# Patient Record
Sex: Female | Born: 1970
Health system: Southern US, Community
[De-identification: ages and names within clinical notes are randomized; demographics above are authoritative.]

## PROBLEM LIST (undated history)

## (undated) DIAGNOSIS — J309 Allergic rhinitis, unspecified: Secondary | ICD-10-CM

## (undated) DIAGNOSIS — I10 Essential (primary) hypertension: Secondary | ICD-10-CM

## (undated) DIAGNOSIS — T7840XA Allergy, unspecified, initial encounter: Secondary | ICD-10-CM

## (undated) DIAGNOSIS — D219 Benign neoplasm of connective and other soft tissue, unspecified: Secondary | ICD-10-CM

## (undated) HISTORY — DX: Allergy, unspecified, initial encounter: T78.40XA

## (undated) HISTORY — DX: Benign neoplasm of connective and other soft tissue, unspecified: D21.9

## (undated) HISTORY — DX: Essential (primary) hypertension: I10

## (undated) HISTORY — PX: EYE SURGERY: SHX253

## (undated) HISTORY — PX: ABDOMINAL HYSTERECTOMY: SHX81

## (undated) HISTORY — DX: Allergic rhinitis, unspecified: J30.9

---
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
## 2003-01-30 HISTORY — PX: COLONOSCOPY: SHX174

## 2018-08-06 ENCOUNTER — Telehealth: Payer: Self-pay | Admitting: *Deleted

## 2018-08-06 NOTE — Telephone Encounter (Signed)
Pt is scheduled for a virtual visit on 08/07/2018

## 2018-08-06 NOTE — Telephone Encounter (Signed)
Copied from [place] [id number]. Topic: Appointment Scheduling - Scheduling Inquiry for Clinic >> Aug 06, 2018 11:24 AM Oneta Rack wrote: Reason for CRM: patient was referred by a [facility] employee to establish with Dr. Grier Mitts, patient relocated from [facility] in October. Patient okay with scheduling a virtual visit, patient experiencing sinus infection. Please follow up with patient today regarding appointment

## 2018-08-07 ENCOUNTER — Ambulatory Visit (INDEPENDENT_AMBULATORY_CARE_PROVIDER_SITE_OTHER): Payer: BC Managed Care – PPO | Admitting: Family Medicine

## 2018-08-07 ENCOUNTER — Encounter: Payer: Self-pay | Admitting: Family Medicine

## 2018-08-07 ENCOUNTER — Other Ambulatory Visit: Payer: Self-pay

## 2018-08-07 DIAGNOSIS — J0100 Acute maxillary sinusitis, unspecified: Secondary | ICD-10-CM | POA: Diagnosis not present

## 2018-08-07 DIAGNOSIS — IMO0001 Reserved for inherently not codable concepts without codable children: Secondary | ICD-10-CM

## 2018-08-07 DIAGNOSIS — Z531 Procedure and treatment not carried out because of patient's decision for reasons of belief and group pressure: Secondary | ICD-10-CM | POA: Diagnosis not present

## 2018-08-07 DIAGNOSIS — Z7689 Persons encountering health services in other specified circumstances: Secondary | ICD-10-CM

## 2018-08-07 DIAGNOSIS — J302 Other seasonal allergic rhinitis: Secondary | ICD-10-CM

## 2018-08-07 MED ORDER — AZITHROMYCIN 250 MG PO TABS: ORAL_TABLET | ORAL | 0 refills | Status: DC

## 2018-08-07 NOTE — Progress Notes (Signed)
Virtual Visit via Video Note  I connected with Deanna Cook on 08/07/18 at 10:00 AM EDT by a video enabled telemedicine application and verified that I am speaking with the correct person using two identifiers.  Location patient: home Location provider:work or home office Persons participating in the virtual visit: patient, provider  I discussed the limitations of evaluation and management by telemedicine and the availability of in person appointments. The patient expressed understanding and agreed to proceed.   HPI: Pt is a 48 yo female with pmh sig for h/o palpitations. Pt was seen at [facility] and by Ruben Im in [place], [place].  Acute concern: -states has been dealing with x 1 month -increased facial pressure, pain/pressure in molars, irritated throat, nasal drainage, HAs.  Coughing more when goes outside. -starting to feel a burning in chest -denies recent abx use, SOB, fever, chills, n/v, sick contacts. -allegra-D and flonase  H/o palpitations: -was drinking several cups of coffee and soda daily -had a negative cardiac w/u -no recent issues.  Past surgical hx: -Hysterectomy 2/2 excessive bleeding from submucosal fibroid in 2014.  At the time hgb was 4.4, pt received iron only which brought her hgb up to 7 so surgery could be done as she is a Jehovah's Witness. -Appendectomy  Social Hx: Pt is married.  Pt does not have any children.  Pt has been in [place] x 1 yr in Oct.  Pt is originally from [place], [place], but lived in [place], [place], [place], [place], [place], and [place].  Pt moved to the area to be closer to her mother who lives in [place], [place].  [place].  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  Pt denies tobacco or drug use.   Pt endorses social alcohol use.  Pt is a Jehovah's Witness.   Allergies: NKDA  Family Medical Hx: Dad-DM, heart dz, MI, "enlarged heart" Mom-HTN, colon cancer  ROS: See pertinent positives and negatives per  HPI.  No past medical history on file.  No family history on file.  No current outpatient medications on file.  EXAM:  VITALS per patient if applicable: RR between 54-09 bpm  GENERAL: alert, oriented, appears well and in no acute distress  HEENT: atraumatic, conjunctiva clear, no obvious abnormalities on inspection of external nose and ears  NECK: normal movements of the head and neck  LUNGS: on inspection no signs of respiratory distress, breathing rate appears normal, no obvious gross SOB, gasping or wheezing  CV: no obvious cyanosis  MS: moves all visible extremities without noticeable abnormality  PSYCH/NEURO: pleasant and cooperative, no obvious depression or anxiety, speech and thought processing grossly intact  ASSESSMENT AND PLAN:  Discussed the following assessment and plan:  Subacute maxillary sinusitis  -continue supportive care: flonase, consider saline nasal spray -discussed management of seasonal allergies - Plan: azithromycin (ZITHROMAX) 250 MG tablet  Seasonal allergies -continue flonase -consider local honey, saline nasal spray -discussed switching to a different OTC allergy medication  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 -We provided refills for any medications we will prescribe as needed. -We addressed current concerns per orders and patient instructions. -We have asked for records for pertinent exams, studies, vaccines and notes from previous providers. -We have advised patient to follow up per instructions below.  F/u prn   I discussed the assessment and treatment plan with  the patient. The patient was provided an opportunity to ask questions and all were answered. The patient agreed with the plan and demonstrated an understanding of the instructions.   The patient was advised to call back or seek an in-person evaluation if the symptoms  worsen or if the condition fails to improve as anticipated.   Billie Ruddy, MD

## 2018-10-08 DIAGNOSIS — J300 Vasomotor rhinitis: Secondary | ICD-10-CM | POA: Diagnosis not present

## 2018-10-08 DIAGNOSIS — J329 Chronic sinusitis, unspecified: Secondary | ICD-10-CM | POA: Diagnosis not present

## 2018-10-08 DIAGNOSIS — J342 Deviated nasal septum: Secondary | ICD-10-CM | POA: Diagnosis not present

## 2018-10-09 ENCOUNTER — Other Ambulatory Visit: Payer: Self-pay

## 2018-10-09 ENCOUNTER — Encounter: Payer: Self-pay | Admitting: Allergy

## 2018-10-09 ENCOUNTER — Ambulatory Visit (INDEPENDENT_AMBULATORY_CARE_PROVIDER_SITE_OTHER): Payer: BC Managed Care – PPO | Admitting: Allergy

## 2018-10-09 VITALS — BP 140/90 | HR 73 | Temp 97.8°F | Resp 18 | Ht 69.0 in | Wt 218.0 lb

## 2018-10-09 DIAGNOSIS — J3089 Other allergic rhinitis: Secondary | ICD-10-CM

## 2018-10-09 DIAGNOSIS — H1013 Acute atopic conjunctivitis, bilateral: Secondary | ICD-10-CM

## 2018-10-09 MED ORDER — LEVOCETIRIZINE DIHYDROCHLORIDE 5 MG PO TABS: 5.0000 mg | ORAL_TABLET | Freq: Every evening | ORAL | 5 refills | Status: DC

## 2018-10-09 MED ORDER — OLOPATADINE HCL 0.1 % OP SOLN: 1.0000 [drp] | Freq: Every day | OPHTHALMIC | 5 refills | Status: DC | PRN

## 2018-10-09 MED ORDER — AZELASTINE-FLUTICASONE 137-50 MCG/ACT NA SUSP: 1.0000 | Freq: Two times a day (BID) | NASAL | 5 refills | Status: DC

## 2018-10-09 NOTE — Patient Instructions (Addendum)
Allergies  - environmental allergy testing today is positive to dust mite and mold (alternaria)  - allergen avoidance measures discussed/handouts provided  - recommend trying long-acting antihistamine, Xyzal 5mg  daily.   This is an antihistamine that you have not tried before and may be more effective than Allegra  - for itchy/watery/red eyes recommend use of over-the-counter Pataday 1 drop each eye daily as needed  -  for nasal congestion/drainage recommend use of generic Dymista (flonase + Astelin) 1 spray each nostril twice a day as needed  - allergen immunotherapy discussed today including protocol, benefits and risk.  Informational handout provided.  If interested in this therapuetic option you can check with your insurance carrier for coverage.  Let us know if you would like to proceed with this option.  Follow-up 4-6 months or sooner if needed

## 2018-10-09 NOTE — Progress Notes (Signed)
New Patient Note  RE: Deanna Cook MRN: [id number] DOB: 1970/09/04 Date of Office Visit: 10/09/2018  Referring provider: Billie Ruddy, MD Primary care provider: Billie Ruddy, MD  Chief Complaint: allergies  History of present illness: Deanna Cook is a 48 y.o. female presenting today for consultation for allergies.    She moved to [place] from [place] area about a year ago.  She feels her allergies are "horrific" here.    She reports having allergy testing before about 20 years ago and recalls being allergic to dust.   She states since moving here she has been getting more sinus infections with sinus pressure in face, behind eyes, teeth pain and feels like she has the flu.  She states she has been treated for sinus infections and Nov, Jan, July thus far.  She states she normally does well with sinus infection treatment with azithromycin.  She denies ever needing systemic steroids. Her regular allergy symptoms include itchy eyes, nasal congestion in the mornings, postnasal drip with throat clearing.  She also feels like she is developing a HA after she has been outside.   She reports taking Allegra and Allegra D which has been the most effective antihistamine for her thus far.  She states Claritin and Zyrtec didn't help at all.   She did see Dr. Lucia Gaskins, ENT who recommended she use flonase.  She states she is using flonase when she feels congested at night.    No history asthma, eczema or food allergy.    Review of systems: Review of Systems  Constitutional: Negative for chills, fever and malaise/fatigue.  HENT: Positive for congestion. Negative for ear discharge, nosebleeds, sinus pain and sore throat.   Eyes: Negative for pain, discharge and redness.  Respiratory: Negative for cough, shortness of breath and wheezing.   Cardiovascular: Negative for chest pain.  Gastrointestinal: Negative for abdominal pain, constipation, diarrhea, heartburn, nausea and vomiting.   Musculoskeletal: Negative for joint pain.  Skin: Negative for itching and rash.  Neurological: Positive for headaches.    All other systems negative unless noted above in HPI  Past medical history: Past Medical History:  Diagnosis Date  . Allergic rhinitis     Past surgical history: Past Surgical History:  Procedure Laterality Date  . ABDOMINAL HYSTERECTOMY    . APPENDECTOMY  1989    Family history:  Family History  Problem Relation Age of Onset  . Hypertension Mother   . Colon cancer Mother   . Heart attack Father   . Diabetes Mellitus I Father   . Kidney failure Father   . Thyroid cancer Sister   . Kidney disease Brother     Social history: Lives in a home with carpeting in the bedroom with electric heating and central cooling.  No pets in the home.  Neighbor has dog.  No concern for water damage, mildew or roaches in the home.  She is an Press photographer.  No smoking history.    Medication List: Allergies as of 10/09/2018   No Known Allergies     Medication List       Accurate as of October 09, 2018  4:56 PM. If you have any questions, ask your nurse or doctor.        STOP taking these medications   azithromycin 250 MG tablet Commonly known as: ZITHROMAX Stopped by: Haleema Vanderheyden Charmian Muff, MD     TAKE these medications   Azelastine-Fluticasone 137-50 MCG/ACT Susp Place 1 spray into the nose 2 (two)  times daily. Started by: Alfredia Desanctis Charmian Muff, MD   B-12 1000 MCG Tabs Take 1 tablet by mouth daily.   fexofenadine 180 MG tablet Commonly known as: ALLEGRA Take 180 mg by mouth daily.   fluticasone 50 MCG/ACT nasal spray Commonly known as: FLONASE Place into both nostrils daily.   levocetirizine 5 MG tablet Commonly known as: XYZAL Take 1 tablet (5 mg total) by mouth every evening. Started by: Azelyn Batie Charmian Muff, MD   multivitamin tablet Take 1 tablet by mouth daily.   olopatadine 0.1 % ophthalmic solution Commonly  known as: PATANOL Place 1 drop into both eyes daily as needed for allergies. Started by: Heron Pitcock Charmian Muff, MD       Known medication allergies: No Known Allergies   Physical examination: Blood pressure 140/90, pulse 73, temperature 97.8 F (36.6 C), temperature source Temporal, resp. rate 18, height 5\' 9"  (1.753 m), weight 218 lb (98.9 kg), SpO2 97 %.  General: Alert, interactive, in no acute distress. HEENT: PERRLA, TMs pearly gray, turbinates moderately edematous without discharge, post-pharynx non erythematous. Neck: Supple without lymphadenopathy. Lungs: Clear to auscultation without wheezing, rhonchi or rales. {no increased work of breathing. CV: Normal S1, S2 without murmurs. Abdomen: Nondistended, nontender. Skin: Warm and dry, without lesions or rashes. Extremities:  No clubbing, cyanosis or edema. Neuro:   Grossly intact.  Diagnositics/Labs:  Allergy testing: environmental allergy skin prick testing is positive to alternaria.  Intradermal testing is positive to mite mix.  Allergy testing results were read and interpreted by provider, documented by clinical staff.   Assessment and plan: Patient Instructions  Allergies  - environmental allergy testing today is positive to dust mite and mold (alternaria)  - allergen avoidance measures discussed/handouts provided  - recommend trying long-acting antihistamine, Xyzal 5mg  daily.   This is an antihistamine that you have not tried before and may be more effective than Allegra  - for itchy/watery/red eyes recommend use of over-the-counter Pataday 1 drop each eye daily as needed  -  for nasal congestion/drainage recommend use of generic Dymista (flonase + Astelin) 1 spray each nostril twice a day as needed  - allergen immunotherapy discussed today including protocol, benefits and risk.  Informational handout provided.  If interested in this therapuetic option you can check with your insurance carrier for coverage.  Let  us know if you would like to proceed with this option.  Follow-up 4-6 months or sooner if needed    I appreciate the opportunity to take part in Deanna Cook's care. Please do not hesitate to contact me with questions.  Sincerely,   Prudy Feeler, MD Allergy/Immunology [facility]

## 2018-10-28 ENCOUNTER — Ambulatory Visit: Payer: BC Managed Care – PPO | Admitting: Allergy and Immunology

## 2018-11-22 ENCOUNTER — Encounter (INDEPENDENT_AMBULATORY_CARE_PROVIDER_SITE_OTHER): Payer: Self-pay

## 2018-12-12 ENCOUNTER — Ambulatory Visit: Payer: BC Managed Care – PPO | Admitting: Family

## 2018-12-12 ENCOUNTER — Telehealth: Payer: Self-pay | Admitting: Family Medicine

## 2018-12-12 ENCOUNTER — Ambulatory Visit: Payer: Self-pay

## 2018-12-12 NOTE — Telephone Encounter (Signed)
I have talked with patient---she is not currently on any blood pressure medicine---she continues to be asymptomatic---I have advised that readings this high for too low increases risk for stroke and the best and safest option would be for her to seek attention at emergency room---I have given address for [facility], patient will go and have her husband go with her---advised she should follow up with her pcp soon

## 2018-12-12 NOTE — Telephone Encounter (Signed)
Pt. Called to report elevated BP today of 198/110 @ 12:30 PM.  Repeated BP check at 12:50 PM: 181/109.    Stated she has been having intermittent headaches.  Denied difficulty with speech, facial droop, loss of vision/ vision changes.  Reported she has noticed a tingly or numb sensation in left leg or in left arm early in AM about every 7-10 days.  Denied headache or numbness in extremities at this time.  Reported she was made aware, at another doctor's office, back in September, that she should begin monitoring her BP, due to elevated readings.  Reported her readings have fluctuated from 150-170's/95-108, and then today was higher.  Attempted to contact [facility] at 12:53.  Advised pt. To expect a call back re: an appt. Today.  Care advice given per protocol.  Pt. Verb. Understanding.      Reason for Disposition . Systolic BP  >= 99991111 OR Diastolic >= A999333  Answer Assessment - Initial Assessment Questions 1. BLOOD PRESSURE: "What is the blood pressure?" "Did you take at least two measurements 5 minutes apart?"     198/110 at 12:30 PM;  181/109 @ 12:50 PM 2. ONSET: "When did you take your blood pressure?"     Has been monitoring her BP since September; .   3. HOW: "How did you obtain the blood pressure?" (e.g., visiting nurse, automatic home BP monitor)     Digital cuff 4. HISTORY: "Do you have a history of high blood pressure?"     Has had higher readings  5. MEDICATIONS: "Are you taking any medications for blood pressure?" "Have you missed any doses recently?"     Not on blood pressure medication 6. OTHER SYMPTOMS: "Do you have any symptoms?" (e.g., headache, chest pain, blurred vision, difficulty breathing, weakness)    intermittent headaches, Denied speech problems, facial droop, disorientation, loss of vision or blurred vision.  Reported intermittent "tingly or numbness" in left leg when she wakes up in the AM, occurring about every 7-10 days.    7. PREGNANCY: "Is there any chance you  are pregnant?" "When was your last menstrual period?"     Had Hysterectomy about 6 yrs ago.  Protocols used: HIGH BLOOD PRESSURE-A-AH

## 2018-12-12 NOTE — Telephone Encounter (Signed)
Contacted FC at [facility].  Spoke with Tammy; was advised there are no appts. Available at their office today.   Ret'd call to pt. Advised that there are no available appts. at [facility].  Pt. stated she would be willing to be evaluated at another [place] office.    Call placed to [facility] ; scheduled pt. for 3:20 PM appt. With Jodi Mourning, NP; pt. Agreed with plan.

## 2018-12-13 ENCOUNTER — Emergency Department (HOSPITAL_COMMUNITY)
Admission: EM | Admit: 2018-12-13 | Discharge: 2018-12-13 | Disposition: A | Discharge status: Home / Self Care | Payer: BC Managed Care – PPO | Attending: Emergency Medicine | Admitting: Emergency Medicine

## 2018-12-13 ENCOUNTER — Emergency Department (HOSPITAL_COMMUNITY): Payer: BC Managed Care – PPO

## 2018-12-13 ENCOUNTER — Encounter (HOSPITAL_COMMUNITY): Payer: Self-pay | Admitting: Emergency Medicine

## 2018-12-13 ENCOUNTER — Other Ambulatory Visit: Payer: Self-pay

## 2018-12-13 DIAGNOSIS — R519 Headache, unspecified: Secondary | ICD-10-CM | POA: Diagnosis not present

## 2018-12-13 DIAGNOSIS — Z79899 Other long term (current) drug therapy: Secondary | ICD-10-CM | POA: Insufficient documentation

## 2018-12-13 DIAGNOSIS — I160 Hypertensive urgency: Secondary | ICD-10-CM

## 2018-12-13 LAB — COMPREHENSIVE METABOLIC PANEL
ALT: 23 U/L (ref 0–44)
AST: 22 U/L (ref 15–41)
Albumin: 4.1 g/dL (ref 3.5–5.0)
Alkaline Phosphatase: 69 U/L (ref 38–126)
Anion gap: 8 (ref 5–15)
BUN: 8 mg/dL (ref 6–20)
CO2: 25 mmol/L (ref 22–32)
Calcium: 9.3 mg/dL (ref 8.9–10.3)
Chloride: 103 mmol/L (ref 98–111)
Creatinine, Ser: 0.91 mg/dL (ref 0.44–1.00)
GFR calc Af Amer: >60 mL/min (ref >60)
GFR calc non Af Amer: >60 mL/min (ref >60)
Glucose, Bld: 124 mg/dL — ABNORMAL HIGH (ref 70–99)
Potassium: 3.5 mmol/L (ref 3.5–5.1)
Sodium: 136 mmol/L (ref 135–145)
Total Bilirubin: 0.6 mg/dL (ref 0.3–1.2)
Total Protein: 7.9 g/dL (ref 6.5–8.1)

## 2018-12-13 LAB — CBC
HCT: 41 % (ref 36.0–46.0)
Hemoglobin: 13.7 g/dL (ref 12.0–15.0)
MCH: 30.4 pg (ref 26.0–34.0)
MCHC: 33.4 g/dL (ref 30.0–36.0)
MCV: 90.9 fL (ref 80.0–100.0)
Platelets: 263 10*3/uL (ref 150–400)
RBC: 4.51 MIL/uL (ref 3.87–5.11)
RDW: 12.5 % (ref 11.5–15.5)
WBC: 8.3 10*3/uL (ref 4.0–10.5)
nRBC: 0 % (ref 0.0–0.2)

## 2018-12-13 MED ORDER — HYDROCHLOROTHIAZIDE 25 MG PO TABS: 25.0000 mg | ORAL_TABLET | Freq: Every day | ORAL | 0 refills | Status: DC

## 2018-12-13 NOTE — ED Triage Notes (Signed)
Pt c/o intermittent headache since being told in September that she has hypertension, pt doesn't currently take meds for htn, states headache has worsened, denies blurry vision, denies edema, but was told to come to ed for htn.

## 2018-12-13 NOTE — Discharge Instructions (Signed)
Take your blood pressure medicine daily.  This may make you urinate more, so I recommend you take it in the morning to prevent the need to urinate at night. If you are getting dizzy or lightheaded, you may either take half a pill a day or stop the medicine. Follow-up with your primary care doctor at your scheduled appointment for further management and evaluation of your blood pressure. Continue taking your blood pressure daily, at the same time each day, for trending and monitoring of your blood pressure. Return to the emergency room if you develop vision changes, chest pain, speech difficulty, numbness/tingling/weakness, or if any new, worsening, or concerning symptoms.

## 2018-12-13 NOTE — ED Provider Notes (Signed)
[facility] DEPT Provider Note   CSN: [id number] Arrival date & time: 12/13/18  1040     History   Chief Complaint Chief Complaint  Patient presents with   Headache   Hypertension    HPI Deanna Cook is a 48 y.o. female presenting for evaluation of hypertension.  Patient states for the past several years, she has had intermittent doctors visits has been found that her blood pressures been high.  2 months ago, it was in the 190s during her allergist appointment.  She was told to follow-up with her primary care doctor.  She has been recording her blood pressures at home, but has yet to follow-up with her PCP.  Patient states at home her blood pressure readings are normally [id number] to [id number] systolic over [id number] to [id number] diastolic.  Patient states she has been having intermittent headaches, but did have a severe headache several days ago.  She reports a headache currently, which could be due to her sinuses which have been acting up, or patient is also concerned this is due to her blood pressure.  She took ibuprofen this morning which improved her headache, but not completely resolved.  She denies vision changes, slurred speech, tingling, numbness, chest pain, shortness of breath, flank pain, abnormal urination.  Patient states 1 time in the past several months she had a single episode of her left leg feeling tingly, which resolved without intervention.  No recurrence of this.  She has no other medical problems, takes no medications daily.  She takes NSAIDs about once a week.  Patient states of the past 2 months, she has decreased her salt intake.  She states she walks frequently.     HPI  Past Medical History:  Diagnosis Date   Allergic rhinitis     Patient Active Problem List   Diagnosis Date Noted   Refusal of blood transfusions as patient is [employer]'s Witness 08/07/2018   Seasonal allergies 08/07/2018    Past Surgical History:  Procedure Laterality  Date   ABDOMINAL HYSTERECTOMY     APPENDECTOMY  1989     OB History   No obstetric history on file.      Home Medications    Prior to Admission medications   Medication Sig Start Date End Date Taking? Authorizing Provider  Azelastine-Fluticasone 137-50 MCG/ACT SUSP Place 1 spray into the nose 2 (two) times daily. 10/09/18   Kennith Gain, MD  Cyanocobalamin (B-12) 1000 MCG TABS Take 1 tablet by mouth daily.    [provider]  fexofenadine (ALLEGRA) 180 MG tablet Take 180 mg by mouth daily.    [provider]  fluticasone (FLONASE) 50 MCG/ACT nasal spray Place into both nostrils daily.    [provider]  hydrochlorothiazide (HYDRODIURIL) 25 MG tablet Take 1 tablet (25 mg total) by mouth daily. 12/13/18   Mykaela Arena, PA-C  levocetirizine (XYZAL) 5 MG tablet Take 1 tablet (5 mg total) by mouth every evening. 10/09/18   Kennith Gain, MD  Multiple Vitamin (MULTIVITAMIN) tablet Take 1 tablet by mouth daily.    [provider]  olopatadine (PATANOL) 0.1 % ophthalmic solution Place 1 drop into both eyes daily as needed for allergies. 10/09/18   Kennith Gain, MD    Family History Family History  Problem Relation Age of Onset   Hypertension Mother    Colon cancer Mother    Heart attack Father    Diabetes Mellitus I Father    Kidney failure Father  Thyroid cancer Sister    Kidney disease Brother     Social History Social History   Tobacco Use   Smoking status: Never Smoker   Smokeless tobacco: Never Used  Substance Use Topics   Alcohol use: Yes    Comment: social   Drug use: Never     Allergies   Patient has no known allergies.   Review of Systems Review of Systems  Neurological: Positive for headaches.  All other systems reviewed and are negative.    Physical Exam Updated Vital Signs BP (!) 171/103    Pulse 79    Temp 98.3 F (36.8 C) (Oral)    Resp 16    SpO2 100%    Physical Exam Vitals signs and nursing note reviewed.  Constitutional:      General: She is not in acute distress.    Appearance: She is well-developed.     Comments: Resting comfortably in the bed in no acute distress  HENT:     Head: Normocephalic and atraumatic.  Eyes:     Extraocular Movements: Extraocular movements intact.     Conjunctiva/sclera: Conjunctivae normal.     Pupils: Pupils are equal, round, and reactive to light.     Comments: EOMI and PERRLA. No nystagmus  Neck:     Musculoskeletal: Normal range of motion and neck supple.  Cardiovascular:     Rate and Rhythm: Normal rate and regular rhythm.     Pulses: Normal pulses.  Pulmonary:     Effort: Pulmonary effort is normal. No respiratory distress.     Breath sounds: Normal breath sounds. No wheezing.     Comments: Clear lung sounds in all fields Abdominal:     General: There is no distension.     Palpations: Abdomen is soft. There is no mass.     Tenderness: There is no abdominal tenderness. There is no guarding or rebound.  Musculoskeletal: Normal range of motion.     Comments: Strength and sensation intact x4.  No leg swelling.  Skin:    General: Skin is warm and dry.     Capillary Refill: Capillary refill takes less than 2 seconds.  Neurological:     General: No focal deficit present.     Mental Status: She is alert and oriented to person, place, and time.     GCS: GCS eye subscore is 4. GCS verbal subscore is 5. GCS motor subscore is 6.     Cranial Nerves: Cranial nerves are intact.     Sensory: Sensation is intact.     Motor: Motor function is intact. No pronator drift.     Coordination: Coordination is intact. Coordination normal. Finger-Nose-Finger Test and Heel to [facility] Test normal.     Comments: No obvious neurologic deficits.  CN intact.  Nose to finger intact.  Fine movement and coordination intact.      ED Treatments / Results  Labs (all labs ordered are listed, but only abnormal results are  displayed) Labs Reviewed  COMPREHENSIVE METABOLIC PANEL - Abnormal; Notable for the following components:      Result Value   Glucose, Bld 124 (*)    All other components within normal limits  CBC    EKG None  Radiology Ct Head Wo Contrast  Result Date: 12/13/2018 CLINICAL DATA:  Intermittent headache, worsened. Hypertension. EXAM: CT HEAD WITHOUT CONTRAST TECHNIQUE: Contiguous axial images were obtained from the base of the skull through the vertex without intravenous contrast. COMPARISON:  None. FINDINGS: Brain: Normal appearing  cerebral hemispheres and posterior fossa structures. Normal size and position of the ventricles. No intracranial hemorrhage, mass lesion or CT evidence of acute infarction. Vascular: No hyperdense vessel or unexpected calcification. Skull: Normal. Negative for fracture or focal lesion. Sinuses/Orbits: Unremarkable. Other: None. IMPRESSION: Normal examination. Electronically Signed   By: Claudie Revering M.D.   On: 12/13/2018 12:02    Procedures Procedures (including critical care time)  Medications Ordered in ED Medications - No data to display   Initial Impression / Assessment and Plan / ED Course  I have reviewed the triage vital signs and the nursing notes.  Pertinent labs & imaging results that were available during my care of the patient were reviewed by me and considered in my medical decision making (see chart for details).        Patient presenting for evaluation of high blood pressure and headache.  Physical exam reassuring, she appears nontoxic.  No obvious neurologic deficits.  Doubt stroke or endorgan damage.  However, as patient has persistent headache, which she states feels slightly different from her normal sinus headaches, will obtain CT to rule out process.  Will obtain basic labs to assess for endorgan damage and to assess kidney function.  Patient will likely need to be started on blood pressure medicine, however can be followed-up  outpatient for asymptomatic hypertension.  Labs reassuring.  Creatinine normal.  CT head without concerning edema or obvious intracranial emergency.  Discussed findings with patient.  Discussed plan for starting HCTZ, continued monitoring of blood pressure, close follow-up with PCP.  Discussed warning signs of hypotension.  Upon discharge, patient states that intermittently she is having chest pain, usually with exertion/exercise.  Occasionally at rest.  I discussed further work-up including EKG and troponin, patient declined.  Patient states she just wanted me to know, but does not want any further evaluation or work-up at this time.  I discussed that I cannot rule out a cardiac abnormality or concerning event with her symptoms and hypertension, patient states she understands but will follow up with her primary care doctor.  Discussed strict return precautions including worsening chest pain, signs of stroke, or other signs of endorgan damage.  Discussed DASH diet and importance of exercise.  At this time, patient appears safe for discharge.  Return precautions given.  Patient states she understands and agrees to plan.   Final Clinical Impressions(s) / ED Diagnoses   Final diagnoses:  Asymptomatic hypertensive urgency    ED Discharge Orders         Ordered    hydrochlorothiazide (HYDRODIURIL) 25 MG tablet  Daily     12/13/18 1230           Franchot Heidelberg, PA-C 12/13/18 1308    Nat Christen, MD 12/13/18 1324

## 2018-12-19 ENCOUNTER — Other Ambulatory Visit: Payer: Self-pay

## 2018-12-19 DIAGNOSIS — Z20822 Contact with and (suspected) exposure to covid-19: Secondary | ICD-10-CM

## 2018-12-22 LAB — NOVEL CORONAVIRUS, NAA: SARS-CoV-2, NAA: NOT DETECTED

## 2018-12-24 ENCOUNTER — Ambulatory Visit (INDEPENDENT_AMBULATORY_CARE_PROVIDER_SITE_OTHER): Payer: BC Managed Care – PPO | Admitting: Family Medicine

## 2018-12-24 ENCOUNTER — Other Ambulatory Visit: Payer: Self-pay

## 2018-12-24 ENCOUNTER — Encounter: Payer: Self-pay | Admitting: Family Medicine

## 2018-12-24 VITALS — BP 132/80 | HR 78 | Temp 97.3°F | Wt 216.0 lb

## 2018-12-24 DIAGNOSIS — I10 Essential (primary) hypertension: Secondary | ICD-10-CM | POA: Diagnosis not present

## 2018-12-24 DIAGNOSIS — E049 Nontoxic goiter, unspecified: Secondary | ICD-10-CM | POA: Diagnosis not present

## 2018-12-24 DIAGNOSIS — Z23 Encounter for immunization: Secondary | ICD-10-CM

## 2018-12-24 DIAGNOSIS — Z0000 Encounter for general adult medical examination without abnormal findings: Secondary | ICD-10-CM

## 2018-12-24 DIAGNOSIS — Z13220 Encounter for screening for lipoid disorders: Secondary | ICD-10-CM

## 2018-12-24 DIAGNOSIS — E6609 Other obesity due to excess calories: Secondary | ICD-10-CM

## 2018-12-24 DIAGNOSIS — N6011 Diffuse cystic mastopathy of right breast: Secondary | ICD-10-CM

## 2018-12-24 LAB — CBC WITH DIFFERENTIAL/PLATELET
Basophils Absolute: 0.1 10*3/uL (ref 0.0–0.1)
Basophils Relative: 0.8 % (ref 0.0–3.0)
Eosinophils Absolute: 0.1 10*3/uL (ref 0.0–0.7)
Eosinophils Relative: 1.4 % (ref 0.0–5.0)
HCT: 39.6 % (ref 36.0–46.0)
Hemoglobin: 13.2 g/dL (ref 12.0–15.0)
Lymphocytes Relative: 30.1 % (ref 12.0–46.0)
Lymphs Abs: 2.4 10*3/uL (ref 0.7–4.0)
MCHC: 33.4 g/dL (ref 30.0–36.0)
MCV: 89.9 fl (ref 78.0–100.0)
Monocytes Absolute: 0.3 10*3/uL (ref 0.1–1.0)
Monocytes Relative: 3.7 % (ref 3.0–12.0)
Neutro Abs: 5.1 10*3/uL (ref 1.4–7.7)
Neutrophils Relative %: 64 % (ref 43.0–77.0)
Platelets: 287 10*3/uL (ref 150.0–400.0)
RBC: 4.41 Mil/uL (ref 3.87–5.11)
RDW: 12.9 % (ref 11.5–15.5)
WBC: 8 10*3/uL (ref 4.0–10.5)

## 2018-12-24 LAB — BASIC METABOLIC PANEL
BUN: 7 mg/dL (ref 6–23)
CO2: 28 mEq/L (ref 19–32)
Calcium: 8.8 mg/dL (ref 8.4–10.5)
Chloride: 101 mEq/L (ref 96–112)
Creatinine, Ser: 0.83 mg/dL (ref 0.40–1.20)
GFR: 89.07 mL/min (ref >60.00)
Glucose, Bld: 118 mg/dL — ABNORMAL HIGH (ref 70–99)
Potassium: 3.6 mEq/L (ref 3.5–5.1)
Sodium: 138 mEq/L (ref 135–145)

## 2018-12-24 LAB — LIPID PANEL
Cholesterol: 161 mg/dL (ref 0–200)
HDL: 57.8 mg/dL (ref >39.00)
LDL Cholesterol: 88 mg/dL (ref 0–99)
NonHDL: 102.8
Total CHOL/HDL Ratio: 3
Triglycerides: 75 mg/dL (ref 0.0–149.0)
VLDL: 15 mg/dL (ref 0.0–40.0)

## 2018-12-24 LAB — HEMOGLOBIN A1C: Hgb A1c MFr Bld: 5.7 % (ref 4.6–6.5)

## 2018-12-24 LAB — T4, FREE: Free T4: 0.83 ng/dL (ref 0.60–1.60)

## 2018-12-24 LAB — TSH: TSH: 1.35 u[IU]/mL (ref 0.35–4.50)

## 2018-12-24 NOTE — Patient Instructions (Addendum)
Preventive Care 47-47 Years Old, Female °Preventive care refers to visits with your health care provider and lifestyle choices that can promote health and wellness. This includes: °· A yearly physical exam. This may also be called an annual well check. °· Regular dental visits and eye exams. °· Immunizations. °· Screening for certain conditions. °· Healthy lifestyle choices, such as eating a healthy diet, getting regular exercise, not using drugs or products that contain nicotine and tobacco, and limiting alcohol use. °What can I expect for my preventive care visit? °Physical exam °Your health care provider will check your: °· Height and weight. This may be used to calculate body mass index (BMI), which tells if you are at a healthy weight. °· Heart rate and blood pressure. °· Skin for abnormal spots. °Counseling °Your health care provider may ask you questions about your: °· Alcohol, tobacco, and drug use. °· Emotional well-being. °· Home and relationship well-being. °· Sexual activity. °· Eating habits. °· Work and work environment. °· Method of birth control. °· Menstrual cycle. °· Pregnancy history. °What immunizations do I need? ° °Influenza (flu) vaccine °· This is recommended every year. °Tetanus, diphtheria, and pertussis (Tdap) vaccine °· You may need a Td booster every 10 years. °Varicella (chickenpox) vaccine °· You may need this if you have not been vaccinated. °Zoster (shingles) vaccine °· You may need this after age 60. °Measles, mumps, and rubella (MMR) vaccine °· You may need at least one dose of MMR if you were born in 1957 or later. You may also need a second dose. °Pneumococcal conjugate (PCV13) vaccine °· You may need this if you have certain conditions and were not previously vaccinated. °Pneumococcal polysaccharide (PPSV23) vaccine °· You may need one or two doses if you smoke cigarettes or if you have certain conditions. °Meningococcal conjugate (MenACWY) vaccine °· You may need this if you  have certain conditions. °Hepatitis A vaccine °· You may need this if you have certain conditions or if you travel or work in places where you may be exposed to hepatitis A. °Hepatitis B vaccine °· You may need this if you have certain conditions or if you travel or work in places where you may be exposed to hepatitis B. °Haemophilus influenzae type b (Hib) vaccine °· You may need this if you have certain conditions. °Human papillomavirus (HPV) vaccine °· If recommended by your health care provider, you may need three doses over 6 months. °You may receive vaccines as individual doses or as more than one vaccine together in one shot (combination vaccines). Talk with your health care provider about the risks and benefits of combination vaccines. °What tests do I need? °Blood tests °· Lipid and cholesterol levels. These may be checked every 5 years, or more frequently if you are over 47 years old. °· Hepatitis C test. °· Hepatitis B test. °Screening °· Lung cancer screening. You may have this screening every year starting at age 47 if you have a 30-pack-year history of smoking and currently smoke or have quit within the past 15 years. °· Colorectal cancer screening. All adults should have this screening starting at age 47 and continuing until age 75. Your health care provider may recommend screening at age 47 if you are at increased risk. You will have tests every 1-10 years, depending on your results and the type of screening test. °· Diabetes screening. This is done by checking your blood sugar (glucose) after you have not eaten for a while (fasting). You may have this   done every 1-3 years.  Mammogram. This may be done every 1-2 years. Talk with your health care provider about when you should start having regular mammograms. This may depend on whether you have a family history of breast cancer.  BRCA-related cancer screening. This may be done if you have a family history of breast, ovarian, tubal, or peritoneal  cancers.  Pelvic exam and Pap test. This may be done every 3 years starting at age 47. Starting at age 47, this may be done every 5 years if you have a Pap test in combination with an HPV test. Other tests  Sexually transmitted disease (STD) testing.  Bone density scan. This is done to screen for osteoporosis. You may have this scan if you are at high risk for osteoporosis. Follow these instructions at home: Eating and drinking  Eat a diet that includes fresh fruits and vegetables, whole grains, lean protein, and low-fat dairy.  Take vitamin and mineral supplements as recommended by your health care provider.  Do not drink alcohol if: ? Your health care provider tells you not to drink. ? You are pregnant, may be pregnant, or are planning to become pregnant.  If you drink alcohol: ? Limit how much you have to 0-1 drink a day. ? Be aware of how much alcohol is in your drink. In the [place] one drink equals one 12 oz bottle of beer (355 mL), one 5 oz glass of wine (148 mL), or one 1 oz glass of hard liquor (44 mL). Lifestyle  Take daily care of your teeth and gums.  Stay active. Exercise for at least 30 minutes on 5 or more days each week.  Do not use any products that contain nicotine or tobacco, such as cigarettes, e-cigarettes, and chewing tobacco. If you need help quitting, ask your health care provider.  If you are sexually active, practice safe sex. Use a condom or other form of birth control (contraception) in order to prevent pregnancy and STIs (sexually transmitted infections).  If told by your health care provider, take low-dose aspirin daily starting at age 47. What's next?  Visit your health care provider once a year for a well check visit.  Ask your health care provider how often you should have your eyes and teeth checked.  Stay up to date on all vaccines. This information is not intended to replace advice given to you by your health care provider. Make sure you  discuss any questions you have with your health care provider. Document Released: 02/11/2015 Document Revised: 09/26/2017 Document Reviewed: 09/26/2017 Elsevier Patient Education  2020 [place] Your Hypertension Hypertension is commonly called high blood pressure. This is when the force of your blood pressing against the walls of your arteries is too strong. Arteries are blood vessels that carry blood from your heart throughout your body. Hypertension forces the heart to work harder to pump blood, and may cause the arteries to become narrow or stiff. Having untreated or uncontrolled hypertension can cause heart attack, stroke, kidney disease, and other problems. What are blood pressure readings? A blood pressure reading consists of a higher number over a lower number. Ideally, your blood pressure should be below 120/80. The first ("top") number is called the systolic pressure. It is a measure of the pressure in your arteries as your heart beats. The second ("bottom") number is called the diastolic pressure. It is a measure of the pressure in your arteries as the heart relaxes. What does my blood pressure reading  mean? Blood pressure is classified into four stages. Based on your blood pressure reading, your health care provider may use the following stages to determine what type of treatment you need, if any. Systolic pressure and diastolic pressure are measured in a unit called mm Hg. Normal  Systolic pressure: below 101.  Diastolic pressure: below 80. Elevated  Systolic pressure: 751-025.  Diastolic pressure: below 80. Hypertension stage 1  Systolic pressure: 852-778.  Diastolic pressure: 24-23. Hypertension stage 2  Systolic pressure: 536 or above.  Diastolic pressure: 90 or above. What health risks are associated with hypertension? Managing your hypertension is an important responsibility. Uncontrolled hypertension can lead to:  A heart attack.  A stroke.  A  weakened blood vessel (aneurysm).  Heart failure.  Kidney damage.  Eye damage.  Metabolic syndrome.  Memory and concentration problems. What changes can I make to manage my hypertension? Hypertension can be managed by making lifestyle changes and possibly by taking medicines. Your health care provider will help you make a plan to bring your blood pressure within a normal range. Eating and drinking   Eat a diet that is high in fiber and potassium, and low in salt (sodium), added sugar, and fat. An example eating plan is called the DASH (Dietary Approaches to Stop Hypertension) diet. To eat this way: ? Eat plenty of fresh fruits and vegetables. Try to fill half of your plate at each meal with fruits and vegetables. ? Eat whole grains, such as whole wheat pasta, brown rice, or whole grain bread. Fill about one quarter of your plate with whole grains. ? Eat low-fat diary products. ? Avoid fatty cuts of meat, processed or cured meats, and poultry with skin. Fill about one quarter of your plate with lean proteins such as fish, chicken without skin, beans, eggs, and tofu. ? Avoid premade and processed foods. These tend to be higher in sodium, added sugar, and fat.  Reduce your daily sodium intake. Most people with hypertension should eat less than 1,500 mg of sodium a day.  Limit alcohol intake to no more than 1 drink a day for nonpregnant women and 2 drinks a day for men. One drink equals 12 oz of beer, 5 oz of wine, or 1 oz of hard liquor. Lifestyle  Work with your health care provider to maintain a healthy body weight, or to lose weight. Ask what an ideal weight is for you.  Get at least 30 minutes of exercise that causes your heart to beat faster (aerobic exercise) most days of the week. Activities may include walking, swimming, or biking.  Include exercise to strengthen your muscles (resistance exercise), such as weight lifting, as part of your weekly exercise routine. Try to do these  types of exercises for 30 minutes at least 3 days a week.  Do not use any products that contain nicotine or tobacco, such as cigarettes and e-cigarettes. If you need help quitting, ask your health care provider.  Control any long-term (chronic) conditions you have, such as high cholesterol or diabetes. Monitoring  Monitor your blood pressure at home as told by your health care provider. Your personal target blood pressure may vary depending on your medical conditions, your age, and other factors.  Have your blood pressure checked regularly, as often as told by your health care provider. Working with your health care provider  Review all the medicines you take with your health care provider because there may be side effects or interactions.  Talk with your health care  provider about your diet, exercise habits, and other lifestyle factors that may be contributing to hypertension.  Visit your health care provider regularly. Your health care provider can help you create and adjust your plan for managing hypertension. Will I need medicine to control my blood pressure? Your health care provider may prescribe medicine if lifestyle changes are not enough to get your blood pressure under control, and if:  Your systolic blood pressure is 130 or higher.  Your diastolic blood pressure is 80 or higher. Take medicines only as told by your health care provider. Follow the directions carefully. Blood pressure medicines must be taken as prescribed. The medicine does not work as well when you skip doses. Skipping doses also puts you at risk for problems. Contact a health care provider if:  You think you are having a reaction to medicines you have taken.  You have repeated (recurrent) headaches.  You feel dizzy.  You have swelling in your ankles.  You have trouble with your vision. Get help right away if:  You develop a severe headache or confusion.  You have unusual weakness or numbness, or you  feel faint.  You have severe pain in your chest or abdomen.  You vomit repeatedly.  You have trouble breathing. Summary  Hypertension is when the force of blood pumping through your arteries is too strong. If this condition is not controlled, it may put you at risk for serious complications.  Your personal target blood pressure may vary depending on your medical conditions, your age, and other factors. For most people, a normal blood pressure is less than 120/80.  Hypertension is managed by lifestyle changes, medicines, or both. Lifestyle changes include weight loss, eating a healthy, low-sodium diet, exercising more, and limiting alcohol. This information is not intended to replace advice given to you by your health care provider. Make sure you discuss any questions you have with your health care provider. Document Released: 10/10/2011 Document Revised: 05/09/2018 Document Reviewed: 12/14/2015 Elsevier Patient Education  [place]  A goiter is an enlarged thyroid gland. The thyroid is located in the lower front of the neck. It makes hormones that affect many body parts and systems, including the system that affects how quickly the body burns fuel for energy (metabolism). Most goiters are painless and are not a cause for concern. Some goiters can affect the way your thyroid makes thyroid hormones. Goiters and conditions that cause goiters can be treated, if necessary. What are the causes? Common causes of this condition include:  Lack (deficiency) of a mineral called iodine. The thyroid gland uses iodine to make thyroid hormones.  Diseases that attack healthy cells in the body (autoimmune diseases) and affect thyroid function, such as Graves' disease or Hashimoto's disease. These diseases may cause the body to produce too much thyroid hormone (hyperthyroidism) or too little of the hormone (hypothyroidism).  Conditions that cause inflammation of the thyroid  (thyroiditis).  One or more small growths on the thyroid (nodular goiter). Other causes include:  Medical problems caused by abnormal genes that are passed from parent to child (genetic defects).  Thyroid injury or infection.  Tumors that may or may not be cancerous.  Pregnancy.  Certain medicines.  Exposure to radiation. In some cases, the cause may not be known. What increases the risk? This condition is more likely to develop in:  People who do not get enough iodine in their diet.  People who have a family history of goiter.  Women.  People who are older than age 49.  People who smoke tobacco.  People who have had exposure to radiation. What are the signs or symptoms? The main symptom of this condition is swelling in the lower, front part of the neck. This swelling can range from a very small bump to a large lump. Other symptoms may include:  A tight feeling in the throat.  A hoarse voice.  Coughing.  Wheezing.  Difficulty swallowing or breathing.  Bulging veins in the neck.  Dizziness. When a goiter is the result of an overactive thyroid (hyperthyroidism), symptoms may also include:  Nervousness or restlessness.  Inability to tolerate heat.  Unexplained weight loss.  Diarrhea.  Change in the texture of hair or skin.  Changes in heartbeat, such as skipped beats, extra beats, or a rapid heart rate.  Loss of menstruation.  Shaky hands.  Increased appetite.  Sleep problems. When a goiter is the result of an underactive thyroid (hypothyroidism), symptoms may also include:  Feeling like you have no energy (lethargy).  Inability to tolerate cold.  Weight gain that is not explained by a change in diet or exercise habits.  Dry skin.  Coarse hair.  Irregular menstrual periods.  Constipation.  Sadness or depression.  Fatigue. In some cases, there may not be any symptoms and the thyroid hormone levels may be normal. How is this  diagnosed? This condition may be diagnosed based on your symptoms, your medical history, and a physical exam. You may have tests, such as:  Blood tests to check thyroid function.  Imaging tests, such as: ? Ultrasound. ? CT scan. ? MRI. ? Thyroid scan.  Removal of a tissue sample (biopsy) of the goiter or any nodules. The sample will be tested to check for cancer. How is this treated? Treatment for this condition depends on the cause and your symptoms. Treatment may include:  Medicines to regulate thyroid hormone levels.  Anti-inflammatory medicines or steroid medicines, if the goiter is caused by inflammation.  Iodine supplements or changes to your diet, if the goiter is caused by iodine deficiency.  Radioactive iodine treatment.  Surgery to remove your thyroid. In some cases, you may only need regular check-ups with your health care provider to monitor your condition, and you may not need treatment. Follow these instructions at home:  Follow instructions from your health care provider about any changes to your diet.  Take over-the-counter and prescription medicines only as told by your health care provider. These include supplements.  Do not use any products that contain nicotine or tobacco, such as cigarettes and e-cigarettes. If you need help quitting, ask your health care provider.  Keep all follow-up visits as told by your health care provider. This is important. Contact a health care provider if:  Your symptoms do not get better with treatment.  You have nausea, vomiting, or diarrhea. Get help right away if:  You have sudden, unexplained confusion or other mental changes.  You have a fever.  You have chest pain.  You have trouble breathing or swallowing.  You suddenly become very weak.  You experience extreme restlessness.  You feel your heart racing. Summary  A goiter is an enlarged thyroid gland.  The thyroid gland is located in the lower front of the  neck. It makes hormones that affect many body parts and systems, including the system that affects how quickly the body burns fuel for energy (metabolism).  The main symptom of this condition is swelling in the lower, front part  of the neck. This swelling can range from a very small bump to a large lump.  Treatment for this condition depends on the cause and your symptoms. You may need medicines, supplements, or regular monitoring of your condition. This information is not intended to replace advice given to you by your health care provider. Make sure you discuss any questions you have with your health care provider. Document Released: 07/05/2009 Document Revised: 12/28/2016 Document Reviewed: 10/11/2016 Elsevier Patient Education  2020 [place].  Fibrocystic Breast Changes  Fibrocystic breast changes are changes in breast tissue that can cause breasts to become swollen, lumpy, or painful. This can happen due to buildup of scar-like tissue (fibrous tissue) or the forming of fluid-filled lumps (cysts) in the breast. This is a common condition, and it is not cancerous (is benign). The exact cause is not known, but it seems to occur when women go through hormonal changes during their menstrual cycle. Fibrocystic breast changes can affect one or both breasts. What are the causes? The exact cause of fibrocystic breast changes is not known. However, this condition:  May be related to the female hormones estrogen and progesterone.  May be influenced by family traits that get passed from parent to child (genetics). What are the signs or symptoms? Symptoms of this condition may affect one or both breasts, and may include:  Tenderness, mild discomfort, or pain.  Swelling.  Rope-like tissue that can be felt when touching the breast.  Lumps in one or both breasts.  Changes in breast size. Breasts may get larger before the menstrual period and smaller after the menstrual period.  Green or dark  brown discharge from the nipple. Symptoms are usually worse before menstrual periods start, and they get better toward the end of menstrual periods. How is this diagnosed? This condition is diagnosed based on your medical history and a physical exam of your breasts. You may also have tests, such as:  A breast X-ray (mammogram).  Ultrasound of your breasts.  MRI.  Removal of a breast tissue sample for testing (breast biopsy). This may be done if your health care provider thinks that something else may be causing changes in your breasts. How is this treated? Often, treatment is not needed for this condition. In some cases, treatment may include:  Taking over-the-counter pain relievers to help lessen pain or discomfort.  Limiting or avoiding caffeine. Foods and beverages that contain caffeine include chocolate, soda, coffee, and tea.  Reducing sugar and fat in your diet. Your health care provider may also recommend:  A procedure to remove fluid from a cyst that is causing pain (fine needle aspiration).  Surgery to remove a cyst that is large or tender or does not go away. Follow these instructions at home:  Examine your breasts after every menstrual period. If you do not have menstrual periods, check your breasts on the first day of every month. Feel for changes in your breasts, such as: ? More tenderness. ? A new growth. ? A change in size. ? A change in an existing lump.  Take over-the-counter and prescription medicines only as told by your health care provider.  Wear a well-fitted support or sports bra, especially when exercising.  Decrease or avoid caffeine, fat, and sugar in your diet as directed by your health care provider. Contact a health care provider if:  You have fluid leaking from your nipple, especially if it is bloody.  You have new lumps or bumps in your breast.  Your breast  becomes enlarged, red, and painful.  You have areas of your breast that pucker  inward.  Your nipple appears flat or indented. Get help right away if:  You have redness of your breast and the redness is spreading. Summary  Fibrocystic breast changes are changes in breast tissue that can cause breasts to become swollen, lumpy, or painful.  This condition may be related to the female hormones estrogen and progesterone.  With this condition, it is important to examine your breasts after every menstrual period. If you do not have menstrual periods, check your breasts on the first day of every month. This information is not intended to replace advice given to you by your health care provider. Make sure you discuss any questions you have with your health care provider. Document Released: 11/01/2005 Document Revised: 12/28/2016 Document Reviewed: 09/14/2015 Elsevier Patient Education  2020 [employer].  Exercising to Lose Weight Exercise is structured, repetitive physical activity to improve fitness and health. Getting regular exercise is important for everyone. It is especially important if you are overweight. Being overweight increases your risk of heart disease, stroke, diabetes, high blood pressure, and several types of cancer. Reducing your calorie intake and exercising can help you lose weight. Exercise is usually categorized as moderate or vigorous intensity. To lose weight, most people need to do a certain amount of moderate-intensity or vigorous-intensity exercise each week. Moderate-intensity exercise  Moderate-intensity exercise is any activity that gets you moving enough to burn at least three times more energy (calories) than if you were sitting. Examples of moderate exercise include:  Walking a mile in 15 minutes.  Doing light yard work.  Biking at an easy pace. Most people should get at least 150 minutes (2 hours and 30 minutes) a week of moderate-intensity exercise to maintain their body weight. Vigorous-intensity exercise Vigorous-intensity exercise  is any activity that gets you moving enough to burn at least six times more calories than if you were sitting. When you exercise at this intensity, you should be working hard enough that you are not able to carry on a conversation. Examples of vigorous exercise include:  Running.  Playing a team sport, such as football, basketball, and soccer.  Jumping rope. Most people should get at least 75 minutes (1 hour and 15 minutes) a week of vigorous-intensity exercise to maintain their body weight. How can exercise affect me? When you exercise enough to burn more calories than you eat, you lose weight. Exercise also reduces body fat and builds muscle. The more muscle you have, the more calories you burn. Exercise also:  Improves mood.  Reduces stress and tension.  Improves your overall fitness, flexibility, and endurance.  Increases bone strength. The amount of exercise you need to lose weight depends on:  Your age.  The type of exercise.  Any health conditions you have.  Your overall physical ability. Talk to your health care provider about how much exercise you need and what types of activities are safe for you. What actions can I take to lose weight? Nutrition   Make changes to your diet as told by your health care provider or diet and nutrition specialist (dietitian). This may include: ? Eating fewer calories. ? Eating more protein. ? Eating less unhealthy fats. ? Eating a diet that includes fresh fruits and vegetables, whole grains, low-fat dairy products, and lean protein. ? Avoiding foods with added fat, salt, and sugar.  Drink plenty of water while you exercise to prevent dehydration or heat stroke. Activity  Choose an activity that you enjoy and set realistic goals. Your health care provider can help you make an exercise plan that works for you.  Exercise at a moderate or vigorous intensity most days of the week. ? The intensity of exercise may vary from person to  person. You can tell how intense a workout is for you by paying attention to your breathing and heartbeat. Most people will notice their breathing and heartbeat get faster with more intense exercise.  Do resistance training twice each week, such as: ? Push-ups. ? Sit-ups. ? Lifting weights. ? Using resistance bands.  Getting short amounts of exercise can be just as helpful as long structured periods of exercise. If you have trouble finding time to exercise, try to include exercise in your daily routine. ? Get up, stretch, and walk around every 30 minutes throughout the day. ? Go for a walk during your lunch break. ? Park your car farther away from your destination. ? If you take public transportation, get off one stop early and walk the rest of the way. ? Make phone calls while standing up and walking around. ? Take the stairs instead of elevators or escalators.  Wear comfortable clothes and shoes with good support.  Do not exercise so much that you hurt yourself, feel dizzy, or get very short of breath. Where to find more information  [place] Department of Health and Human Services: [employer]  Centers for Disease Control and Prevention (CDC): http://www.wolf.info/ Contact a health care provider:  Before starting a new exercise program.  If you have questions or concerns about your weight.  If you have a medical problem that keeps you from exercising. Get help right away if you have any of the following while exercising:  Injury.  Dizziness.  Difficulty breathing or shortness of breath that does not go away when you stop exercising.  Chest pain.  Rapid heartbeat. Summary  Being overweight increases your risk of heart disease, stroke, diabetes, high blood pressure, and several types of cancer.  Losing weight happens when you burn more calories than you eat.  Reducing the amount of calories you eat in addition to getting regular moderate or vigorous exercise each week helps you  lose weight. This information is not intended to replace advice given to you by your health care provider. Make sure you discuss any questions you have with your health care provider. Document Released: 02/17/2010 Document Revised: 01/28/2017 Document Reviewed: 01/28/2017 Elsevier Patient Education  2020 [employer].  Palpitations Palpitations are feelings that your heartbeat is irregular or is faster than normal. It may feel like your heart is fluttering or skipping a beat. Palpitations are usually not a serious problem. They may be caused by many things, including smoking, caffeine, alcohol, stress, and certain medicines or drugs. Most causes of palpitations are not serious. However, some palpitations can be a sign of a serious problem. You may need further tests to rule out serious medical problems. Follow these instructions at home:     Pay attention to any changes in your condition. Take these actions to help manage your symptoms: Eating and drinking  Avoid foods and drinks that may cause palpitations. These may include: ? Caffeinated coffee, tea, soft drinks, diet pills, and energy drinks. ? Chocolate. ? Alcohol. Lifestyle  Take steps to reduce your stress and anxiety. Things that can help you relax include: ? Yoga. ? Mind-body activities, such as deep breathing, meditation, or using words and images to create positive thoughts (guided imagery). ?  Physical activity, such as swimming, jogging, or walking. Tell your health care provider if your palpitations increase with activity. If you have chest pain or shortness of breath with activity, do not continue the activity until you are seen by your health care provider. ? Biofeedback. This is a method that helps you learn to use your mind to control things in your body, such as your heartbeat.  Do not use drugs, including cocaine or ecstasy. Do not use marijuana.  Get plenty of rest and sleep. Keep a regular bed time. General  instructions  Take over-the-counter and prescription medicines only as told by your health care provider.  Do not use any products that contain nicotine or tobacco, such as cigarettes and e-cigarettes. If you need help quitting, ask your health care provider.  Keep all follow-up visits as told by your health care provider. This is important. These may include visits for further testing if palpitations do not go away or get worse. Contact a health care provider if you:  Continue to have a fast or irregular heartbeat after 24 hours.  Notice that your palpitations occur more often. Get help right away if you:  Have chest pain or shortness of breath.  Have a severe headache.  Feel dizzy or you faint. Summary  Palpitations are feelings that your heartbeat is irregular or is faster than normal. It may feel like your heart is fluttering or skipping a beat.  Palpitations may be caused by many things, including smoking, caffeine, alcohol, stress, certain medicines, and drugs.  Although most causes of palpitations are not serious, some causes can be a sign of a serious medical problem.  Get help right away if you faint or have chest pain, shortness of breath, a severe headache, or dizziness. This information is not intended to replace advice given to you by your health care provider. Make sure you discuss any questions you have with your health care provider. Document Released: 01/13/2000 Document Revised: 02/27/2017 Document Reviewed: 02/27/2017 Elsevier Patient Education  2020 [employer].

## 2018-12-24 NOTE — Progress Notes (Signed)
Subjective:     Deanna Cook is a 48 y.o. female and is here for a comprehensive physical exam. The patient reports problems - breast concern and HTN.  Pt noticed a mass in R breast a few days ago.  Area is not painful.  Pt denies nipple retraction, skin changes, or nipple drainage.  Pt's last mammogram was over 1 yr ago.  Pt went to the ED 11/14 for HAs 2/2 elevated bp.  Pt was started on HCTZ 25 mg daily.  Pt denies dizziness, CP, changes in vision.  Trying to drink more water and exercise.  Pt notes being told bp was high in the past, but never started medication.  Social History   Socioeconomic History  . Marital status: Married    Spouse name: Not on file  . Number of children: Not on file  . Years of education: Not on file  . Highest education level: Not on file  Occupational History  . Not on file  Social Needs  . Financial resource strain: Not on file  . Food insecurity    Worry: Not on file    Inability: Not on file  . Transportation needs    Medical: Not on file    Non-medical: Not on file  Tobacco Use  . Smoking status: Never Smoker  . Smokeless tobacco: Never Used  Substance and Sexual Activity  . Alcohol use: Yes    Comment: social  . Drug use: Never  . Sexual activity: Not on file  Lifestyle  . Physical activity    Days per week: Not on file    Minutes per session: Not on file  . Stress: Not on file  Relationships  . Social Herbalist on phone: Not on file    Gets together: Not on file    Attends religious service: Not on file    Active member of club or organization: Not on file    Attends meetings of clubs or organizations: Not on file    Relationship status: Not on file  . Intimate partner violence    Fear of current or ex partner: Not on file    Emotionally abused: Not on file    Physically abused: Not on file    Forced sexual activity: Not on file  Other Topics Concern  . Not on file  Social History Narrative  . Not on file   Health  Maintenance  Topic Date Due  . HIV Screening  09/30/1986  . TETANUS/TDAP  09/30/1990  . PAP SMEAR-Modifier  09/29/1992  . INFLUENZA VACCINE  08/30/2018    The following portions of the patient's history were reviewed and updated as appropriate: allergies, current medications, past family history, past medical history, past social history, past surgical history and problem list.  Review of Systems Pertinent items noted in HPI and remainder of comprehensive ROS otherwise negative.   Objective:    BP 132/80   Pulse 78   Temp (!) 97.3 F (36.3 C) (Temporal)   Wt 216 lb (98 kg)   SpO2 98%   BMI 31.90 kg/m  General appearance: alert, cooperative and no distress Head: Normocephalic, without obvious abnormality, atraumatic Eyes: conjunctivae/corneas clear. PERRL, EOM's intact. Fundi benign. Ears: normal TM's and external ear canals both ears Nose: Nares normal. Septum midline. Mucosa normal. No drainage or sinus tenderness., no discharge Throat: lips, mucosa, and tongue normal; teeth and gums normal Neck: no adenopathy, no carotid bruit, no JVD, supple, symmetrical, trachea midline and mild  thryomegaly, symmetric Lungs: clear to auscultation bilaterally Breasts: normal appearance, no skin changes, no nipple inversion.  R breast with fibrocystic mass in 3 o'clock position.  No TTP Round edges to mass. Heart: regular rate and rhythm, S1, S2 normal, no murmur, click, rub or gallop Abdomen: soft, non-tender; bowel sounds normal; no masses,  no organomegaly Extremities: extremities normal, atraumatic, no cyanosis or edema Pulses: 2+ and symmetric Skin: Skin color, texture, turgor normal. No rashes or lesions Lymph nodes: Cervical, supraclavicular, and axillary nodes normal. Neurologic: Alert and oriented X 3, normal strength and tone. Normal symmetric reflexes. Normal coordination and gait    Assessment:    Healthy female exam with breast mass likely fibrocystic tissue.     Plan:      Anticipatory guidance given including wearing seatbelts, smoke detectors in the home, increasing physical activity, increasing p.o. intake of water and vegetables. -will obtain labs -pap done 2-3 yrs ago. -R breast with mass at 3 o'clock position, likely fibrodensity.  Will place order for diagnostic mammogram of R breast. See After Visit  Summary for Counseling Recommendations    Essential hypertension  -continue HCTZ 25 mg -discussed obtaining bp cuff to monitor bp at home. -start lifestyle modifications. -given handout -advised may need K+ supplement.  Will send in rx if needed based on lab results. - Plan: Basic Metabolic Panel  Screening for cholesterol level  - Plan: Lipid Panel  Fibrocystic changes of right breast  - Plan: MM DIAG BREAST TOMO UNI RIGHT  Obesity due to excess calories without serious comorbidity, unspecified classification  -lifestyle modificaitons - Plan: Hemoglobin A1c, Lipid Panel  Goiter  -mildly enlarged on exam - Plan: TSH, T4, Free  Need for influenza vaccination  - Plan: Flu Vaccine QUAD 6+ mos PF IM (Fluarix Quad PF)  F/u in 1 month for HTN  Grier Mitts, MD

## 2018-12-29 ENCOUNTER — Other Ambulatory Visit: Payer: Self-pay | Admitting: Family Medicine

## 2018-12-29 DIAGNOSIS — Z1231 Encounter for screening mammogram for malignant neoplasm of breast: Secondary | ICD-10-CM

## 2018-12-30 ENCOUNTER — Other Ambulatory Visit: Payer: Self-pay | Admitting: Family Medicine

## 2018-12-30 ENCOUNTER — Encounter: Payer: Self-pay | Admitting: Family Medicine

## 2018-12-30 DIAGNOSIS — N6011 Diffuse cystic mastopathy of right breast: Secondary | ICD-10-CM

## 2018-12-30 MED ORDER — HYDROCHLOROTHIAZIDE 25 MG PO TABS: 25.0000 mg | ORAL_TABLET | Freq: Every day | ORAL | 2 refills | Status: DC

## 2019-01-09 ENCOUNTER — Ambulatory Visit
Admission: RE | Admit: 2019-01-09 | Discharge: 2019-01-09 | Disposition: A | Discharge status: Home / Self Care | Payer: BC Managed Care – PPO | Source: Ambulatory Visit | Attending: Family Medicine | Admitting: Family Medicine

## 2019-01-09 ENCOUNTER — Other Ambulatory Visit: Payer: Self-pay

## 2019-01-09 DIAGNOSIS — N6011 Diffuse cystic mastopathy of right breast: Secondary | ICD-10-CM

## 2019-01-09 DIAGNOSIS — R922 Inconclusive mammogram: Secondary | ICD-10-CM | POA: Diagnosis not present

## 2019-02-09 ENCOUNTER — Encounter: Payer: Self-pay | Admitting: Family Medicine

## 2019-02-10 NOTE — Telephone Encounter (Signed)
Pt is scheduled for in office visit tomorrow 11/11/2018 at 8.30 am

## 2019-02-11 ENCOUNTER — Ambulatory Visit: Payer: 59 | Admitting: Family Medicine

## 2019-02-11 ENCOUNTER — Encounter: Payer: Self-pay | Admitting: Family Medicine

## 2019-02-11 VITALS — BP 146/98 | HR 74 | Temp 97.8°F | Wt 221.0 lb

## 2019-02-11 DIAGNOSIS — M5432 Sciatica, left side: Secondary | ICD-10-CM | POA: Diagnosis not present

## 2019-02-11 DIAGNOSIS — R202 Paresthesia of skin: Secondary | ICD-10-CM

## 2019-02-11 DIAGNOSIS — I10 Essential (primary) hypertension: Secondary | ICD-10-CM | POA: Insufficient documentation

## 2019-02-11 MED ORDER — AMLODIPINE BESYLATE 5 MG PO TABS: 5.0000 mg | ORAL_TABLET | Freq: Every day | ORAL | 3 refills | Status: DC

## 2019-02-11 NOTE — Patient Instructions (Signed)
Managing Your Hypertension Hypertension is commonly called high blood pressure. This is when the force of your blood pressing against the walls of your arteries is too strong. Arteries are blood vessels that carry blood from your heart throughout your body. Hypertension forces the heart to work harder to pump blood, and may cause the arteries to become narrow or stiff. Having untreated or uncontrolled hypertension can cause heart attack, stroke, kidney disease, and other problems. What are blood pressure readings? A blood pressure reading consists of a higher number over a lower number. Ideally, your blood pressure should be below 120/80. The first ("top") number is called the systolic pressure. It is a measure of the pressure in your arteries as your heart beats. The second ("bottom") number is called the diastolic pressure. It is a measure of the pressure in your arteries as the heart relaxes. What does my blood pressure reading mean? Blood pressure is classified into four stages. Based on your blood pressure reading, your health care provider may use the following stages to determine what type of treatment you need, if any. Systolic pressure and diastolic pressure are measured in a unit called mm Hg. Normal  Systolic pressure: below 120.  Diastolic pressure: below 80. Elevated  Systolic pressure: 120-129.  Diastolic pressure: below 80. Hypertension stage 1  Systolic pressure: 130-139.  Diastolic pressure: 80-89. Hypertension stage 2  Systolic pressure: 140 or above.  Diastolic pressure: 90 or above. What health risks are associated with hypertension? Managing your hypertension is an important responsibility. Uncontrolled hypertension can lead to:  A heart attack.  A stroke.  A weakened blood vessel (aneurysm).  Heart failure.  Kidney damage.  Eye damage.  Metabolic syndrome.  Memory and concentration problems. What changes can I make to manage my  hypertension? Hypertension can be managed by making lifestyle changes and possibly by taking medicines. Your health care provider will help you make a plan to bring your blood pressure within a normal range. Eating and drinking   Eat a diet that is high in fiber and potassium, and low in salt (sodium), added sugar, and fat. An example eating plan is called the DASH (Dietary Approaches to Stop Hypertension) diet. To eat this way: ? Eat plenty of fresh fruits and vegetables. Try to fill half of your plate at each meal with fruits and vegetables. ? Eat whole grains, such as whole wheat pasta, brown rice, or whole grain bread. Fill about one quarter of your plate with whole grains. ? Eat low-fat diary products. ? Avoid fatty cuts of meat, processed or cured meats, and poultry with skin. Fill about one quarter of your plate with lean proteins such as fish, chicken without skin, beans, eggs, and tofu. ? Avoid premade and processed foods. These tend to be higher in sodium, added sugar, and fat.  Reduce your daily sodium intake. Most people with hypertension should eat less than 1,500 mg of sodium a day.  Limit alcohol intake to no more than 1 drink a day for nonpregnant women and 2 drinks a day for men. One drink equals 12 oz of beer, 5 oz of wine, or 1 oz of hard liquor. Lifestyle  Work with your health care provider to maintain a healthy body weight, or to lose weight. Ask what an ideal weight is for you.  Get at least 30 minutes of exercise that causes your heart to beat faster (aerobic exercise) most days of the week. Activities may include walking, swimming, or biking.  Include exercise   to strengthen your muscles (resistance exercise), such as weight lifting, as part of your weekly exercise routine. Try to do these types of exercises for 30 minutes at least 3 days a week.  Do not use any products that contain nicotine or tobacco, such as cigarettes and e-cigarettes. If you need help quitting,  ask your health care provider.  Control any long-term (chronic) conditions you have, such as high cholesterol or diabetes. Monitoring  Monitor your blood pressure at home as told by your health care provider. Your personal target blood pressure may vary depending on your medical conditions, your age, and other factors.  Have your blood pressure checked regularly, as often as told by your health care provider. Working with your health care provider  Review all the medicines you take with your health care provider because there may be side effects or interactions.  Talk with your health care provider about your diet, exercise habits, and other lifestyle factors that may be contributing to hypertension.  Visit your health care provider regularly. Your health care provider can help you create and adjust your plan for managing hypertension. Will I need medicine to control my blood pressure? Your health care provider may prescribe medicine if lifestyle changes are not enough to get your blood pressure under control, and if:  Your systolic blood pressure is 130 or higher.  Your diastolic blood pressure is 80 or higher. Take medicines only as told by your health care provider. Follow the directions carefully. Blood pressure medicines must be taken as prescribed. The medicine does not work as well when you skip doses. Skipping doses also puts you at risk for problems. Contact a health care provider if:  You think you are having a reaction to medicines you have taken.  You have repeated (recurrent) headaches.  You feel dizzy.  You have swelling in your ankles.  You have trouble with your vision. Get help right away if:  You develop a severe headache or confusion.  You have unusual weakness or numbness, or you feel faint.  You have severe pain in your chest or abdomen.  You vomit repeatedly.  You have trouble breathing. Summary  Hypertension is when the force of blood pumping  through your arteries is too strong. If this condition is not controlled, it may put you at risk for serious complications.  Your personal target blood pressure may vary depending on your medical conditions, your age, and other factors. For most people, a normal blood pressure is less than 120/80.  Hypertension is managed by lifestyle changes, medicines, or both. Lifestyle changes include weight loss, eating a healthy, low-sodium diet, exercising more, and limiting alcohol. This information is not intended to replace advice given to you by your health care provider. Make sure you discuss any questions you have with your health care provider. Document Revised: 05/09/2018 Document Reviewed: 12/14/2015 Elsevier Patient Education  2020 [employer]  How to Take Your Blood Pressure You can take your blood pressure at home with a machine. You may need to check your blood pressure at home:  To check if you have high blood pressure (hypertension).  To check your blood pressure over time.  To make sure your blood pressure medicine is working. Supplies needed: You will need a blood pressure machine, or monitor. You can buy one at a drugstore or online. When choosing one:  Choose one with an arm cuff.  Choose one that wraps around your upper arm. Only one finger should fit between your arm   and the cuff.  Do not choose one that measures your blood pressure from your wrist or finger. Your doctor can suggest a monitor. How to prepare Avoid these things for 30 minutes before checking your blood pressure:  Drinking caffeine.  Drinking alcohol.  Eating.  Smoking.  Exercising. Five minutes before checking your blood pressure:  Pee.  Sit in a dining chair. Avoid sitting in a soft couch or armchair.  Be quiet. Do not talk. How to take your blood pressure Follow the instructions that came with your machine. If you have a digital blood pressure monitor, these may be the  instructions: 1. Sit up straight. 2. Place your feet on the floor. Do not cross your ankles or legs. 3. Rest your left arm at the level of your heart. You may rest it on a table, desk, or chair. 4. Pull up your shirt sleeve. 5. Wrap the blood pressure cuff around the upper part of your left arm. The cuff should be 1 inch (2.5 cm) above your elbow. It is best to wrap the cuff around bare skin. 6. Fit the cuff snugly around your arm. You should be able to place only one finger between the cuff and your arm. 7. Put the cord inside the groove of your elbow. 8. Press the power button. 9. Sit quietly while the cuff fills with air and loses air. 10. Write down the numbers on the screen. 11. Wait 2-3 minutes and then repeat steps 1-10. What do the numbers mean? Two numbers make up your blood pressure. The first number is called systolic pressure. The second is called diastolic pressure. An example of a blood pressure reading is "120 over 80" (or 120/80). If you are an adult and do not have a medical condition, use this guide to find out if your blood pressure is normal: Normal  First number: below 120.  Second number: below 80. Elevated  First number: 120-129.  Second number: below 80. Hypertension stage 1  First number: 130-139.  Second number: 80-89. Hypertension stage 2  First number: 140 or above.  Second number: 90 or above. Your blood pressure is above normal even if only the top or bottom number is above normal. Follow these instructions at home:  Check your blood pressure as often as your doctor tells you to.  Take your monitor to your next doctor's appointment. Your doctor will: ? Make sure you are using it correctly. ? Make sure it is working right.  Make sure you understand what your blood pressure numbers should be.  Tell your doctor if your medicines are causing side effects. Contact a doctor if:  Your blood pressure keeps being high. Get help right away  if:  Your first blood pressure number is higher than 180.  Your second blood pressure number is higher than 120. This information is not intended to replace advice given to you by your health care provider. Make sure you discuss any questions you have with your health care provider. Document Revised: 12/28/2016 Document Reviewed: 06/24/2015 Elsevier Patient Education  2020 [employer]  

## 2019-02-11 NOTE — Progress Notes (Signed)
Subjective:    Patient ID: Deanna Cook, female    DOB: 1971-01-05, 49 y.o.   MRN: [id number]  No chief complaint on file.   HPI Patient was seen today for f/u on HTN.  Pt taking HCTZ 25 mg daily, but has not noticed a change in her bp.  Has changed her diet.  Pt has decreased sodium intake (<2000 mg/d using an app to track), increased walking for exercise, decreased fast food consumption by cooking at home more.  BP at home 135/97-this AM, 162/105, 02/15/2011, 155/99, 150/98, 161/111.  Pt denies HAs, changes in vision, chest pain.   Pt's mother has h/o HTN on Norvasc.   Pt has noticed tingling in LUE to elbow and in L leg.  May have also had some numbness tingling in L buttock/post thigh.  Typically occurs in am.  Past Medical History:  Diagnosis Date  . Allergic rhinitis     No Known Allergies  ROS General: Denies fever, chills, night sweats, changes in weight, changes in appetite HEENT: Denies headaches, ear pain, changes in vision, rhinorrhea, sore throat CV: Denies CP, palpitations, SOB, orthopnea Pulm: Denies SOB, cough, wheezing GI: Denies abdominal pain, nausea, vomiting, diarrhea, constipation GU: Denies dysuria, hematuria, frequency, vaginal discharge Msk: Denies muscle cramps, joint pains Neuro: Denies weakness, numbness +tingling/numbness in L upper arm and L thigh. Skin: Denies rashes, bruising Psych: Denies depression, anxiety, hallucinations     Objective:    Blood pressure (!) 146/98, pulse 74, temperature 97.8 F (36.6 C), temperature source Temporal, weight 221 lb (100.2 kg), SpO2 98 %.   Gen. Pleasant, well-nourished, in no distress, normal affect   HEENT: [place]/AT, face symmetric, no scleral icterus, PERRLA, EOMI, nares patent without drainage Lungs: no accessory muscle use, CTAB, no wheezes or rales Cardiovascular: RRR, no m/r/g, no peripheral edema Musculoskeletal: No TTP of spine, L buttock, or thigh.  No deformities, no cyanosis or clubbing, normal  tone Neuro:  A&Ox3, CN II-XII intact, normal gait Skin:  Warm, no lesions/ rash  Wt Readings from Last 3 Encounters:  12/24/18 216 lb (98 kg)  10/09/18 218 lb (98.9 kg)    Lab Results  Component Value Date   WBC 8.0 12/24/2018   HGB 13.2 12/24/2018   HCT 39.6 12/24/2018   PLT 287.0 12/24/2018   GLUCOSE 118 (H) 12/24/2018   CHOL 161 12/24/2018   TRIG 75.0 12/24/2018   HDL 57.80 12/24/2018   LDLCALC 88 12/24/2018   ALT 23 12/13/2018   AST 22 12/13/2018   NA 138 12/24/2018   K 3.6 12/24/2018   CL 101 12/24/2018   CREATININE 0.83 12/24/2018   BUN 7 12/24/2018   CO2 28 12/24/2018   TSH 1.35 12/24/2018   HGBA1C 5.7 12/24/2018    Assessment/Plan:  Essential hypertension  -uncontrolled -will d/c HCTZ 25 mg as no change in bp noted -will start Norvasc 5 mg (pt's mother is on this, may be beneficial for pt) -continue lifestyle modifications.  Discussed reduced sodium diet 2,000-2,500 mg/day. -continue checking bp at home. - Plan: amLODipine (NORVASC) 5 MG tablet  Sciatica of left side -discussed stretching, taking breaks from sitting all day when working from home. -continue to monitor  Paresthesia -possibly 2/2 HCTZ causing hypokalemia vs HTN -HCTZ d/c'd -consider f/u BMP, Vit B12, folate for continued symptoms  F/u in 3-4 wks  Grier Mitts, MD

## 2019-03-11 ENCOUNTER — Other Ambulatory Visit: Payer: Self-pay

## 2019-03-11 ENCOUNTER — Ambulatory Visit: Payer: 59 | Admitting: Emergency Medicine

## 2019-03-11 ENCOUNTER — Encounter: Payer: Self-pay | Admitting: Emergency Medicine

## 2019-03-11 VITALS — BP 152/82 | HR 77 | Temp 98.2°F | Resp 16 | Ht 69.0 in | Wt 219.0 lb

## 2019-03-11 DIAGNOSIS — Z7689 Persons encountering health services in other specified circumstances: Secondary | ICD-10-CM

## 2019-03-11 DIAGNOSIS — I10 Essential (primary) hypertension: Secondary | ICD-10-CM | POA: Diagnosis not present

## 2019-03-11 NOTE — Progress Notes (Signed)
Deanna Cook 49 y.o.   Chief Complaint  Patient presents with  . Establish Care  . Hypertension    HISTORY OF PRESENT ILLNESS: This is a 49 y.o. female first visit to this office, here to establish care with me. Past medical history includes hypertension, on amlodipine 5 mg daily. Also has a history of seasonal allergies. Self health grade: B+, thanks she is deficient with nutrition and exercise. Requesting GYN referral.  Status post hysterectomy years ago.  Ovaries intact. Non-smoker no EtOH abuser. No complaints or medical concerns today. HPI   Prior to Admission medications   Medication Sig Start Date End Date Taking? Authorizing Provider  amLODipine (NORVASC) 5 MG tablet Take 1 tablet (5 mg total) by mouth daily. 02/11/19  Yes Billie Ruddy, MD  Azelastine-Fluticasone 137-50 MCG/ACT SUSP Place 1 spray into the nose 2 (two) times daily. 10/09/18  Yes Padgett, Rae Halsted, MD  Cyanocobalamin (B-12) 1000 MCG TABS Take 1 tablet by mouth daily.   Yes [provider]  fexofenadine (ALLEGRA) 180 MG tablet Take 180 mg by mouth daily.   Yes [provider]  fluticasone (FLONASE) 50 MCG/ACT nasal spray Place into both nostrils daily.   Yes [provider]  levocetirizine (XYZAL) 5 MG tablet Take 1 tablet (5 mg total) by mouth every evening. 10/09/18  Yes Kennith Gain, MD  Multiple Vitamin (MULTIVITAMIN) tablet Take 1 tablet by mouth daily.   Yes [provider]  olopatadine (PATANOL) 0.1 % ophthalmic solution Place 1 drop into both eyes daily as needed for allergies. 10/09/18  Yes Kennith Gain, MD    No Known Allergies  Patient Active Problem List   Diagnosis Date Noted  . Essential hypertension 02/11/2019  . Refusal of blood transfusions as patient is Jehovah's Witness 08/07/2018  . Seasonal allergies 08/07/2018    Past Medical History:  Diagnosis Date  . Allergic rhinitis     Past Surgical History:   Procedure Laterality Date  . ABDOMINAL HYSTERECTOMY    . APPENDECTOMY  1989    Social History   Socioeconomic History  . Marital status: Married    Spouse name: Not on file  . Number of children: Not on file  . Years of education: Not on file  . Highest education level: Not on file  Occupational History  . Not on file  Tobacco Use  . Smoking status: Never Smoker  . Smokeless tobacco: Never Used  Substance and Sexual Activity  . Alcohol use: Yes    Comment: social  . Drug use: Never  . Sexual activity: Not on file  Other Topics Concern  . Not on file  Social History Narrative  . Not on file   Social Determinants of Health   Financial Resource Strain:   . Difficulty of Paying Living Expenses: Not on file  Food Insecurity:   . Worried About Charity fundraiser in the Last Year: Not on file  . Ran Out of Food in the Last Year: Not on file  Transportation Needs:   . Lack of Transportation (Medical): Not on file  . Lack of Transportation (Non-Medical): Not on file  Physical Activity:   . Days of Exercise per Week: Not on file  . Minutes of Exercise per Session: Not on file  Stress:   . Feeling of Stress : Not on file  Social Connections:   . Frequency of Communication with Friends and Family: Not on file  . Frequency of Social Gatherings with Friends and  Family: Not on file  . Attends Religious Services: Not on file  . Active Member of Clubs or Organizations: Not on file  . Attends Archivist Meetings: Not on file  . Marital Status: Not on file  Intimate Partner Violence:   . Fear of Current or Ex-Partner: Not on file  . Emotionally Abused: Not on file  . Physically Abused: Not on file  . Sexually Abused: Not on file    Family History  Problem Relation Age of Onset  . Hypertension Mother   . Colon cancer Mother   . Heart attack Father   . Diabetes Mellitus I Father   . Kidney failure Father   . Thyroid cancer Sister   . Kidney disease Brother       Review of Systems  Constitutional: Negative.  Negative for chills and fever.  HENT: Negative.  Negative for congestion and sore throat.   Respiratory: Negative.  Negative for cough and shortness of breath.   Cardiovascular: Negative.  Negative for chest pain and palpitations.  Gastrointestinal: Negative.  Negative for abdominal pain, blood in stool, diarrhea, melena, nausea and vomiting.  Genitourinary: Negative.  Negative for dysuria and hematuria.  Musculoskeletal: Negative.  Negative for back pain, joint pain and myalgias.  Skin: Negative.  Negative for rash.  Neurological: Negative.  Negative for dizziness and headaches.  All other systems reviewed and are negative.   Today's Vitals   03/11/19 1334  BP: (!) 152/82  Pulse: 77  Resp: 16  Temp: 98.2 F (36.8 C)  TempSrc: Temporal  SpO2: 99%  Weight: 219 lb (99.3 kg)  Height: 5\' 9"  (1.753 m)   Body mass index is 32.34 kg/m.  Physical Exam Vitals reviewed.  Constitutional:      Appearance: Normal appearance.  HENT:     Head: Normocephalic.  Eyes:     Extraocular Movements: Extraocular movements intact.     Conjunctiva/sclera: Conjunctivae normal.     Pupils: Pupils are equal, round, and reactive to light.  Neck:     Vascular: No carotid bruit.  Cardiovascular:     Rate and Rhythm: Normal rate and regular rhythm.     Pulses: Normal pulses.     Heart sounds: Normal heart sounds.  Pulmonary:     Effort: Pulmonary effort is normal.     Breath sounds: Normal breath sounds.  Musculoskeletal:        General: Normal range of motion.     Cervical back: Normal range of motion and neck supple.  Skin:    General: Skin is warm and dry.     Capillary Refill: Capillary refill takes less than 2 seconds.  Neurological:     General: No focal deficit present.     Mental Status: She is alert and oriented to person, place, and time.  Psychiatric:        Mood and Affect: Mood normal.        Behavior: Behavior normal.    A  total of 45 minutes was spent with the patient, greater than 50% of which was in counseling/coordination of care regarding chronic medical problems including hypertension and cardiovascular risks associated with this, management including medications, diet and nutrition and importance of physical exercise, review of most recent blood work results done in November 2020, Covid precautions, health maintenance items, prognosis and need for follow-up.   ASSESSMENT & PLAN: Shatrice was seen today for establish care and hypertension.  Diagnoses and all orders for this visit:  Essential hypertension  Encounter to establish care -     Ambulatory referral to Gynecology    Patient Instructions       If you have lab work done today you will be contacted with your lab results within the next 2 weeks.  If you have not heard from [place] then please contact us. The fastest way to get your results is to register for My Chart.   IF you received an x-ray today, you will receive an invoice from [facility]. Please contact [facility] at [phone] with questions or concerns regarding your invoice.   IF you received labwork today, you will receive an invoice from [facility]. Please contact [facility] at [phone] with questions or concerns regarding your invoice.   Our billing staff will not be able to assist you with questions regarding bills from these companies.  You will be contacted with the lab results as soon as they are available. The fastest way to get your results is to activate your My Chart account. Instructions are located on the last page of this paperwork. If you have not heard from [place] regarding the results in 2 weeks, please contact this office.     Health Maintenance, Female Adopting a healthy lifestyle and getting preventive care are important in promoting health and wellness. Ask your health care provider about:  The right schedule for you to have regular tests and  exams.  Things you can do on your own to prevent diseases and keep yourself healthy. What should I know about diet, weight, and exercise? Eat a healthy diet   Eat a diet that includes plenty of vegetables, fruits, low-fat dairy products, and lean protein.  Do not eat a lot of foods that are high in solid fats, added sugars, or sodium. Maintain a healthy weight Body mass index (BMI) is used to identify weight problems. It estimates body fat based on height and weight. Your health care provider can help determine your BMI and help you achieve or maintain a healthy weight. Get regular exercise Get regular exercise. This is one of the most important things you can do for your health. Most adults should:  Exercise for at least 150 minutes each week. The exercise should increase your heart rate and make you sweat (moderate-intensity exercise).  Do strengthening exercises at least twice a week. This is in addition to the moderate-intensity exercise.  Spend less time sitting. Even light physical activity can be beneficial. Watch cholesterol and blood lipids Have your blood tested for lipids and cholesterol at 48 years of age, then have this test every 5 years. Have your cholesterol levels checked more often if:  Your lipid or cholesterol levels are high.  You are older than 48 years of age.  You are at high risk for heart disease. What should I know about cancer screening? Depending on your health history and family history, you may need to have cancer screening at various ages. This may include screening for:  Breast cancer.  Cervical cancer.  Colorectal cancer.  Skin cancer.  Lung cancer. What should I know about heart disease, diabetes, and high blood pressure? Blood pressure and heart disease  High blood pressure causes heart disease and increases the risk of stroke. This is more likely to develop in people who have high blood pressure readings, are of African descent, or are  overweight.  Have your blood pressure checked: ? Every 3-5 years if you are 64-95 years of age. ? Every year if you are 38 years old  or older. Diabetes Have regular diabetes screenings. This checks your fasting blood sugar level. Have the screening done:  Once every three years after age 67 if you are at a normal weight and have a low risk for diabetes.  More often and at a younger age if you are overweight or have a high risk for diabetes. What should I know about preventing infection? Hepatitis B If you have a higher risk for hepatitis B, you should be screened for this virus. Talk with your health care provider to find out if you are at risk for hepatitis B infection. Hepatitis C Testing is recommended for:  Everyone born from [birth year] through 1964.  Anyone with known risk factors for hepatitis C. Sexually transmitted infections (STIs)  Get screened for STIs, including gonorrhea and chlamydia, if: ? You are sexually active and are younger than 48 years of age. ? You are older than 48 years of age and your health care provider tells you that you are at risk for this type of infection. ? Your sexual activity has changed since you were last screened, and you are at increased risk for chlamydia or gonorrhea. Ask your health care provider if you are at risk.  Ask your health care provider about whether you are at high risk for HIV. Your health care provider may recommend a prescription medicine to help prevent HIV infection. If you choose to take medicine to prevent HIV, you should first get tested for HIV. You should then be tested every 3 months for as long as you are taking the medicine. Pregnancy  If you are about to stop having your period (premenopausal) and you may become pregnant, seek counseling before you get pregnant.  Take 400 to 800 micrograms (mcg) of folic acid every day if you become pregnant.  Ask for birth control (contraception) if you want to prevent pregnancy.  Osteoporosis and menopause Osteoporosis is a disease in which the bones lose minerals and strength with aging. This can result in bone fractures. If you are 86 years old or older, or if you are at risk for osteoporosis and fractures, ask your health care provider if you should:  Be screened for bone loss.  Take a calcium or vitamin D supplement to lower your risk of fractures.  Be given hormone replacement therapy (HRT) to treat symptoms of menopause. Follow these instructions at home: Lifestyle  Do not use any products that contain nicotine or tobacco, such as cigarettes, e-cigarettes, and chewing tobacco. If you need help quitting, ask your health care provider.  Do not use street drugs.  Do not share needles.  Ask your health care provider for help if you need support or information about quitting drugs. Alcohol use  Do not drink alcohol if: ? Your health care provider tells you not to drink. ? You are pregnant, may be pregnant, or are planning to become pregnant.  If you drink alcohol: ? Limit how much you use to 0-1 drink a day. ? Limit intake if you are breastfeeding.  Be aware of how much alcohol is in your drink. In the [place] one drink equals one 12 oz bottle of beer (355 mL), one 5 oz glass of wine (148 mL), or one 1 oz glass of hard liquor (44 mL). General instructions  Schedule regular health, dental, and eye exams.  Stay current with your vaccines.  Tell your health care provider if: ? You often feel depressed. ? You have ever been abused or do not feel  safe at home. Summary  Adopting a healthy lifestyle and getting preventive care are important in promoting health and wellness.  Follow your health care provider's instructions about healthy diet, exercising, and getting tested or screened for diseases.  Follow your health care provider's instructions on monitoring your cholesterol and blood pressure. This information is not intended to replace advice given  to you by your health care provider. Make sure you discuss any questions you have with your health care provider. Document Revised: 01/08/2018 Document Reviewed: 01/08/2018 Elsevier Patient Education  [place].  Hypertension, Adult High blood pressure (hypertension) is when the force of blood pumping through the arteries is too strong. The arteries are the blood vessels that carry blood from the heart throughout the body. Hypertension forces the heart to work harder to pump blood and may cause arteries to become narrow or stiff. Untreated or uncontrolled hypertension can cause a heart attack, heart failure, a stroke, kidney disease, and other problems. A blood pressure reading consists of a higher number over a lower number. Ideally, your blood pressure should be below 120/80. The first ("top") number is called the systolic pressure. It is a measure of the pressure in your arteries as your heart beats. The second ("bottom") number is called the diastolic pressure. It is a measure of the pressure in your arteries as the heart relaxes. What are the causes? The exact cause of this condition is not known. There are some conditions that result in or are related to high blood pressure. What increases the risk? Some risk factors for high blood pressure are under your control. The following factors may make you more likely to develop this condition:  Smoking.  Having type 2 diabetes mellitus, high cholesterol, or both.  Not getting enough exercise or physical activity.  Being overweight.  Having too much fat, sugar, calories, or salt (sodium) in your diet.  Drinking too much alcohol. Some risk factors for high blood pressure may be difficult or impossible to change. Some of these factors include:  Having chronic kidney disease.  Having a family history of high blood pressure.  Age. Risk increases with age.  Race. You may be at higher risk if you are African American.  Gender. Men  are at higher risk than women before age 9. After age 65, women are at higher risk than men.  Having obstructive sleep apnea.  Stress. What are the signs or symptoms? High blood pressure may not cause symptoms. Very high blood pressure (hypertensive crisis) may cause:  Headache.  Anxiety.  Shortness of breath.  Nosebleed.  Nausea and vomiting.  Vision changes.  Severe chest pain.  Seizures. How is this diagnosed? This condition is diagnosed by measuring your blood pressure while you are seated, with your arm resting on a flat surface, your legs uncrossed, and your feet flat on the floor. The cuff of the blood pressure monitor will be placed directly against the skin of your upper arm at the level of your heart. It should be measured at least twice using the same arm. Certain conditions can cause a difference in blood pressure between your right and left arms. Certain factors can cause blood pressure readings to be lower or higher than normal for a short period of time:  When your blood pressure is higher when you are in a health care provider's office than when you are at home, this is called white coat hypertension. Most people with this condition do not need medicines.  When your  blood pressure is higher at home than when you are in a health care provider's office, this is called masked hypertension. Most people with this condition may need medicines to control blood pressure. If you have a high blood pressure reading during one visit or you have normal blood pressure with other risk factors, you may be asked to:  Return on a different day to have your blood pressure checked again.  Monitor your blood pressure at home for 1 week or longer. If you are diagnosed with hypertension, you may have other blood or imaging tests to help your health care provider understand your overall risk for other conditions. How is this treated? This condition is treated by making healthy lifestyle  changes, such as eating healthy foods, exercising more, and reducing your alcohol intake. Your health care provider may prescribe medicine if lifestyle changes are not enough to get your blood pressure under control, and if:  Your systolic blood pressure is above 130.  Your diastolic blood pressure is above 80. Your personal target blood pressure may vary depending on your medical conditions, your age, and other factors. Follow these instructions at home: Eating and drinking   Eat a diet that is high in fiber and potassium, and low in sodium, added sugar, and fat. An example eating plan is called the DASH (Dietary Approaches to Stop Hypertension) diet. To eat this way: ? Eat plenty of fresh fruits and vegetables. Try to fill one half of your plate at each meal with fruits and vegetables. ? Eat whole grains, such as whole-wheat pasta, brown rice, or whole-grain bread. Fill about one fourth of your plate with whole grains. ? Eat or drink low-fat dairy products, such as skim milk or low-fat yogurt. ? Avoid fatty cuts of meat, processed or cured meats, and poultry with skin. Fill about one fourth of your plate with lean proteins, such as fish, chicken without skin, beans, eggs, or tofu. ? Avoid pre-made and processed foods. These tend to be higher in sodium, added sugar, and fat.  Reduce your daily sodium intake. Most people with hypertension should eat less than 1,500 mg of sodium a day.  Do not drink alcohol if: ? Your health care provider tells you not to drink. ? You are pregnant, may be pregnant, or are planning to become pregnant.  If you drink alcohol: ? Limit how much you use to:  0-1 drink a day for women.  0-2 drinks a day for men. ? Be aware of how much alcohol is in your drink. In the [place] one drink equals one 12 oz bottle of beer (355 mL), one 5 oz glass of wine (148 mL), or one 1 oz glass of hard liquor (44 mL). Lifestyle   Work with your health care provider to maintain  a healthy body weight or to lose weight. Ask what an ideal weight is for you.  Get at least 30 minutes of exercise most days of the week. Activities may include walking, swimming, or biking.  Include exercise to strengthen your muscles (resistance exercise), such as Pilates or lifting weights, as part of your weekly exercise routine. Try to do these types of exercises for 30 minutes at least 3 days a week.  Do not use any products that contain nicotine or tobacco, such as cigarettes, e-cigarettes, and chewing tobacco. If you need help quitting, ask your health care provider.  Monitor your blood pressure at home as told by your health care provider.  Keep all follow-up visits  as told by your health care provider. This is important. Medicines  Take over-the-counter and prescription medicines only as told by your health care provider. Follow directions carefully. Blood pressure medicines must be taken as prescribed.  Do not skip doses of blood pressure medicine. Doing this puts you at risk for problems and can make the medicine less effective.  Ask your health care provider about side effects or reactions to medicines that you should watch for. Contact a health care provider if you:  Think you are having a reaction to a medicine you are taking.  Have headaches that keep coming back (recurring).  Feel dizzy.  Have swelling in your ankles.  Have trouble with your vision. Get help right away if you:  Develop a severe headache or confusion.  Have unusual weakness or numbness.  Feel faint.  Have severe pain in your chest or abdomen.  Vomit repeatedly.  Have trouble breathing. Summary  Hypertension is when the force of blood pumping through your arteries is too strong. If this condition is not controlled, it may put you at risk for serious complications.  Your personal target blood pressure may vary depending on your medical conditions, your age, and other factors. For most  people, a normal blood pressure is less than 120/80.  Hypertension is treated with lifestyle changes, medicines, or a combination of both. Lifestyle changes include losing weight, eating a healthy, low-sodium diet, exercising more, and limiting alcohol. This information is not intended to replace advice given to you by your health care provider. Make sure you discuss any questions you have with your health care provider. Document Revised: 09/25/2017 Document Reviewed: 09/25/2017 Elsevier Patient Education  2020 [employer] Inc[employer]      Agustina Caroli, MD [facility]

## 2019-03-11 NOTE — Patient Instructions (Addendum)
   If you have lab work done today you will be contacted with your lab results within the next 2 weeks.  If you have not heard from us then please contact us. The fastest way to get your results is to register for My Chart.   IF you received an x-ray today, you will receive an invoice from [facility]. Please contact [facility] at [phone] with questions or concerns regarding your invoice.   IF you received labwork today, you will receive an invoice from [facility]. Please contact [facility] at [phone] with questions or concerns regarding your invoice.   Our billing staff will not be able to assist you with questions regarding bills from these companies.  You will be contacted with the lab results as soon as they are available. The fastest way to get your results is to activate your My Chart account. Instructions are located on the last page of this paperwork. If you have not heard from us regarding the results in 2 weeks, please contact this office.      Health Maintenance, Female Adopting a healthy lifestyle and getting preventive care are important in promoting health and wellness. Ask your health care provider about:  The right schedule for you to have regular tests and exams.  Things you can do on your own to prevent diseases and keep yourself healthy. What should I know about diet, weight, and exercise? Eat a healthy diet   Eat a diet that includes plenty of vegetables, fruits, low-fat dairy products, and lean protein.  Do not eat a lot of foods that are high in solid fats, added sugars, or sodium. Maintain a healthy weight Body mass index (BMI) is used to identify weight problems. It estimates body fat based on height and weight. Your health care provider can help determine your BMI and help you achieve or maintain a healthy weight. Get regular exercise Get regular exercise. This is one of the most important things you can do for your health. Most  adults should:  Exercise for at least 150 minutes each week. The exercise should increase your heart rate and make you sweat (moderate-intensity exercise).  Do strengthening exercises at least twice a week. This is in addition to the moderate-intensity exercise.  Spend less time sitting. Even light physical activity can be beneficial. Watch cholesterol and blood lipids Have your blood tested for lipids and cholesterol at 48 years of age, then have this test every 5 years. Have your cholesterol levels checked more often if:  Your lipid or cholesterol levels are high.  You are older than 48 years of age.  You are at high risk for heart disease. What should I know about cancer screening? Depending on your health history and family history, you may need to have cancer screening at various ages. This may include screening for:  Breast cancer.  Cervical cancer.  Colorectal cancer.  Skin cancer.  Lung cancer. What should I know about heart disease, diabetes, and high blood pressure? Blood pressure and heart disease  High blood pressure causes heart disease and increases the risk of stroke. This is more likely to develop in people who have high blood pressure readings, are of African descent, or are overweight.  Have your blood pressure checked: ? Every 3-5 years if you are 18-39 years of age. ? Every year if you are 40 years old or older. Diabetes Have regular diabetes screenings. This checks your fasting blood sugar level. Have the screening done:  Once every   three years after age 40 if you are at a normal weight and have a low risk for diabetes.  More often and at a younger age if you are overweight or have a high risk for diabetes. What should I know about preventing infection? Hepatitis B If you have a higher risk for hepatitis B, you should be screened for this virus. Talk with your health care provider to find out if you are at risk for hepatitis B infection. Hepatitis  C Testing is recommended for:  Everyone born from 1945 through 1965.  Anyone with known risk factors for hepatitis C. Sexually transmitted infections (STIs)  Get screened for STIs, including gonorrhea and chlamydia, if: ? You are sexually active and are younger than 48 years of age. ? You are older than 48 years of age and your health care provider tells you that you are at risk for this type of infection. ? Your sexual activity has changed since you were last screened, and you are at increased risk for chlamydia or gonorrhea. Ask your health care provider if you are at risk.  Ask your health care provider about whether you are at high risk for HIV. Your health care provider may recommend a prescription medicine to help prevent HIV infection. If you choose to take medicine to prevent HIV, you should first get tested for HIV. You should then be tested every 3 months for as long as you are taking the medicine. Pregnancy  If you are about to stop having your period (premenopausal) and you may become pregnant, seek counseling before you get pregnant.  Take 400 to 800 micrograms (mcg) of folic acid every day if you become pregnant.  Ask for birth control (contraception) if you want to prevent pregnancy. Osteoporosis and menopause Osteoporosis is a disease in which the bones lose minerals and strength with aging. This can result in bone fractures. If you are 65 years old or older, or if you are at risk for osteoporosis and fractures, ask your health care provider if you should:  Be screened for bone loss.  Take a calcium or vitamin D supplement to lower your risk of fractures.  Be given hormone replacement therapy (HRT) to treat symptoms of menopause. Follow these instructions at home: Lifestyle  Do not use any products that contain nicotine or tobacco, such as cigarettes, e-cigarettes, and chewing tobacco. If you need help quitting, ask your health care provider.  Do not use street  drugs.  Do not share needles.  Ask your health care provider for help if you need support or information about quitting drugs. Alcohol use  Do not drink alcohol if: ? Your health care provider tells you not to drink. ? You are pregnant, may be pregnant, or are planning to become pregnant.  If you drink alcohol: ? Limit how much you use to 0-1 drink a day. ? Limit intake if you are breastfeeding.  Be aware of how much alcohol is in your drink. In the [place] one drink equals one 12 oz bottle of beer (355 mL), one 5 oz glass of wine (148 mL), or one 1 oz glass of hard liquor (44 mL). General instructions  Schedule regular health, dental, and eye exams.  Stay current with your vaccines.  Tell your health care provider if: ? You often feel depressed. ? You have ever been abused or do not feel safe at home. Summary  Adopting a healthy lifestyle and getting preventive care are important in promoting health and wellness.    Follow your health care provider's instructions about healthy diet, exercising, and getting tested or screened for diseases.  Follow your health care provider's instructions on monitoring your cholesterol and blood pressure. This information is not intended to replace advice given to you by your health care provider. Make sure you discuss any questions you have with your health care provider. Document Revised: 01/08/2018 Document Reviewed: 01/08/2018 Elsevier Patient Education  2020 Elsevier Inc.  Hypertension, Adult High blood pressure (hypertension) is when the force of blood pumping through the arteries is too strong. The arteries are the blood vessels that carry blood from the heart throughout the body. Hypertension forces the heart to work harder to pump blood and may cause arteries to become narrow or stiff. Untreated or uncontrolled hypertension can cause a heart attack, heart failure, a stroke, kidney disease, and other problems. A blood pressure reading  consists of a higher number over a lower number. Ideally, your blood pressure should be below 120/80. The first ("top") number is called the systolic pressure. It is a measure of the pressure in your arteries as your heart beats. The second ("bottom") number is called the diastolic pressure. It is a measure of the pressure in your arteries as the heart relaxes. What are the causes? The exact cause of this condition is not known. There are some conditions that result in or are related to high blood pressure. What increases the risk? Some risk factors for high blood pressure are under your control. The following factors may make you more likely to develop this condition:  Smoking.  Having type 2 diabetes mellitus, high cholesterol, or both.  Not getting enough exercise or physical activity.  Being overweight.  Having too much fat, sugar, calories, or salt (sodium) in your diet.  Drinking too much alcohol. Some risk factors for high blood pressure may be difficult or impossible to change. Some of these factors include:  Having chronic kidney disease.  Having a family history of high blood pressure.  Age. Risk increases with age.  Race. You may be at higher risk if you are African American.  Gender. Men are at higher risk than women before age 45. After age 65, women are at higher risk than men.  Having obstructive sleep apnea.  Stress. What are the signs or symptoms? High blood pressure may not cause symptoms. Very high blood pressure (hypertensive crisis) may cause:  Headache.  Anxiety.  Shortness of breath.  Nosebleed.  Nausea and vomiting.  Vision changes.  Severe chest pain.  Seizures. How is this diagnosed? This condition is diagnosed by measuring your blood pressure while you are seated, with your arm resting on a flat surface, your legs uncrossed, and your feet flat on the floor. The cuff of the blood pressure monitor will be placed directly against the skin of  your upper arm at the level of your heart. It should be measured at least twice using the same arm. Certain conditions can cause a difference in blood pressure between your right and left arms. Certain factors can cause blood pressure readings to be lower or higher than normal for a short period of time:  When your blood pressure is higher when you are in a health care provider's office than when you are at home, this is called white coat hypertension. Most people with this condition do not need medicines.  When your blood pressure is higher at home than when you are in a health care provider's office, this is called masked hypertension.   Most people with this condition may need medicines to control blood pressure. If you have a high blood pressure reading during one visit or you have normal blood pressure with other risk factors, you may be asked to:  Return on a different day to have your blood pressure checked again.  Monitor your blood pressure at home for 1 week or longer. If you are diagnosed with hypertension, you may have other blood or imaging tests to help your health care provider understand your overall risk for other conditions. How is this treated? This condition is treated by making healthy lifestyle changes, such as eating healthy foods, exercising more, and reducing your alcohol intake. Your health care provider may prescribe medicine if lifestyle changes are not enough to get your blood pressure under control, and if:  Your systolic blood pressure is above 130.  Your diastolic blood pressure is above 80. Your personal target blood pressure may vary depending on your medical conditions, your age, and other factors. Follow these instructions at home: Eating and drinking   Eat a diet that is high in fiber and potassium, and low in sodium, added sugar, and fat. An example eating plan is called the DASH (Dietary Approaches to Stop Hypertension) diet. To eat this way: ? Eat plenty  of fresh fruits and vegetables. Try to fill one half of your plate at each meal with fruits and vegetables. ? Eat whole grains, such as whole-wheat pasta, brown rice, or whole-grain bread. Fill about one fourth of your plate with whole grains. ? Eat or drink low-fat dairy products, such as skim milk or low-fat yogurt. ? Avoid fatty cuts of meat, processed or cured meats, and poultry with skin. Fill about one fourth of your plate with lean proteins, such as fish, chicken without skin, beans, eggs, or tofu. ? Avoid pre-made and processed foods. These tend to be higher in sodium, added sugar, and fat.  Reduce your daily sodium intake. Most people with hypertension should eat less than 1,500 mg of sodium a day.  Do not drink alcohol if: ? Your health care provider tells you not to drink. ? You are pregnant, may be pregnant, or are planning to become pregnant.  If you drink alcohol: ? Limit how much you use to:  0-1 drink a day for women.  0-2 drinks a day for men. ? Be aware of how much alcohol is in your drink. In the [place] one drink equals one 12 oz bottle of beer (355 mL), one 5 oz glass of wine (148 mL), or one 1 oz glass of hard liquor (44 mL). Lifestyle   Work with your health care provider to maintain a healthy body weight or to lose weight. Ask what an ideal weight is for you.  Get at least 30 minutes of exercise most days of the week. Activities may include walking, swimming, or biking.  Include exercise to strengthen your muscles (resistance exercise), such as Pilates or lifting weights, as part of your weekly exercise routine. Try to do these types of exercises for 30 minutes at least 3 days a week.  Do not use any products that contain nicotine or tobacco, such as cigarettes, e-cigarettes, and chewing tobacco. If you need help quitting, ask your health care provider.  Monitor your blood pressure at home as told by your health care provider.  Keep all follow-up visits as told  by your health care provider. This is important. Medicines  Take over-the-counter and prescription medicines only as told by   your health care provider. Follow directions carefully. Blood pressure medicines must be taken as prescribed.  Do not skip doses of blood pressure medicine. Doing this puts you at risk for problems and can make the medicine less effective.  Ask your health care provider about side effects or reactions to medicines that you should watch for. Contact a health care provider if you:  Think you are having a reaction to a medicine you are taking.  Have headaches that keep coming back (recurring).  Feel dizzy.  Have swelling in your ankles.  Have trouble with your vision. Get help right away if you:  Develop a severe headache or confusion.  Have unusual weakness or numbness.  Feel faint.  Have severe pain in your chest or abdomen.  Vomit repeatedly.  Have trouble breathing. Summary  Hypertension is when the force of blood pumping through your arteries is too strong. If this condition is not controlled, it may put you at risk for serious complications.  Your personal target blood pressure may vary depending on your medical conditions, your age, and other factors. For most people, a normal blood pressure is less than 120/80.  Hypertension is treated with lifestyle changes, medicines, or a combination of both. Lifestyle changes include losing weight, eating a healthy, low-sodium diet, exercising more, and limiting alcohol. This information is not intended to replace advice given to you by your health care provider. Make sure you discuss any questions you have with your health care provider. Document Revised: 09/25/2017 Document Reviewed: 09/25/2017 Elsevier Patient Education  2020 Elsevier Inc.  

## 2019-04-05 ENCOUNTER — Ambulatory Visit: Payer: 59 | Attending: Internal Medicine

## 2019-04-05 DIAGNOSIS — Z23 Encounter for immunization: Secondary | ICD-10-CM | POA: Insufficient documentation

## 2019-04-05 NOTE — Progress Notes (Signed)
   Covid-19 Vaccination Clinic  Name:  Deanna Cook    MRN: [id number] DOB: 10-02-1970  04/05/2019  [name] was observed post Covid-19 immunization for 15 minutes without incident. She was provided with Vaccine Information Sheet and instruction to access the V-Safe system.   [name] was instructed to call 911 with any severe reactions post vaccine: Marland Kitchen Difficulty breathing  . Swelling of face and throat  . A fast heartbeat  . A bad rash all over body  . Dizziness and weakness   Immunizations Administered    Name Date Dose VIS Date Route   Pfizer COVID-19 Vaccine 04/05/2019  5:24 PM 0.3 mL 01/09/2019 Intramuscular   Manufacturer: [place]   Lot: [id number]   [place]: [id number]

## 2019-04-13 ENCOUNTER — Ambulatory Visit: Payer: 59 | Admitting: Obstetrics and Gynecology

## 2019-04-27 ENCOUNTER — Ambulatory Visit: Payer: 59

## 2019-04-27 ENCOUNTER — Ambulatory Visit (INDEPENDENT_AMBULATORY_CARE_PROVIDER_SITE_OTHER): Payer: 59 | Admitting: Obstetrics and Gynecology

## 2019-04-27 ENCOUNTER — Encounter: Payer: Self-pay | Admitting: Obstetrics and Gynecology

## 2019-04-27 ENCOUNTER — Other Ambulatory Visit: Payer: Self-pay

## 2019-04-27 ENCOUNTER — Ambulatory Visit: Payer: 59 | Attending: Internal Medicine

## 2019-04-27 VITALS — BP 124/82 | Ht 69.0 in | Wt 216.0 lb

## 2019-04-27 DIAGNOSIS — R6882 Decreased libido: Secondary | ICD-10-CM | POA: Diagnosis not present

## 2019-04-27 DIAGNOSIS — G4720 Circadian rhythm sleep disorder, unspecified type: Secondary | ICD-10-CM | POA: Diagnosis not present

## 2019-04-27 DIAGNOSIS — R6889 Other general symptoms and signs: Secondary | ICD-10-CM

## 2019-04-27 DIAGNOSIS — R4586 Emotional lability: Secondary | ICD-10-CM

## 2019-04-27 DIAGNOSIS — R232 Flushing: Secondary | ICD-10-CM

## 2019-04-27 DIAGNOSIS — Z23 Encounter for immunization: Secondary | ICD-10-CM

## 2019-04-27 NOTE — Progress Notes (Signed)
   Covid-19 Vaccination Clinic  Name:  Deanna Cook    MRN: [id number] DOB: Mar 26, 1970  04/27/2019  [name] was observed post Covid-19 immunization for 15 minutes without incident. She was provided with Vaccine Information Sheet and instruction to access the V-Safe system.   [name] was instructed to call 911 with any severe reactions post vaccine: Marland Kitchen Difficulty breathing  . Swelling of face and throat  . A fast heartbeat  . A bad rash all over body  . Dizziness and weakness   Immunizations Administered    Name Date Dose VIS Date Route   Pfizer COVID-19 Vaccine 04/27/2019  9:07 AM 0.3 mL 01/09/2019 Intramuscular   Manufacturer: [place]   Lot: [id number]   [place]: [id number]

## 2019-04-27 NOTE — Progress Notes (Signed)
   Deanna Cook May 28, 1970 [id number]  SUBJECTIVE:  49 y.o. G0P0000 female presents to discuss possible pause symptoms.  She has had increased trouble remembering things that writing them down, also noting more mood lability and changes.  She feels warm with increased perspiration, particularly notable at night.  She finds that she has had disruption in her sleep pattern, frequently waking up at 3 or 4 AM and having trouble getting back to sleep.  She has been using melatonin which has helped a little bit.  She also finds that she has had decreased sexual drive with her husband.  No specific concerns or dyspareunia.  She is a Engineer, technical sales at [employer].    She has no gynecologic concerns. Current Outpatient Medications  Medication Sig Dispense Refill  . amLODipine (NORVASC) 5 MG tablet Take 1 tablet (5 mg total) by mouth daily. 30 tablet 3  . Azelastine-Fluticasone 137-50 MCG/ACT SUSP Place 1 spray into the nose 2 (two) times daily. 23 g 5  . Cyanocobalamin (B-12) 1000 MCG TABS Take 1 tablet by mouth daily.    . fexofenadine (ALLEGRA) 180 MG tablet Take 180 mg by mouth daily.    . fluticasone (FLONASE) 50 MCG/ACT nasal spray Place into both nostrils daily.    . Multiple Vitamin (MULTIVITAMIN) tablet Take 1 tablet by mouth daily.    Marland Kitchen olopatadine (PATANOL) 0.1 % ophthalmic solution Place 1 drop into both eyes daily as needed for allergies. 5 mL 5   No current facility-administered medications for this visit.   Allergies: Patient has no known allergies.  No LMP recorded. Patient has had a hysterectomy.  Past medical history,surgical history, problem list, medications, allergies, family history and social history were all reviewed and documented as reviewed in the EPIC chart.  ROS:  Feeling well. No dyspnea or chest pain on exertion.  No abdominal pain, change in bowel habits, black or bloody stools.  No urinary tract symptoms. GYN ROS: no abnormal bleeding, pelvic pain or discharge, no breast  pain or new or enlarging lumps on self exam. No neurological complaints.   OBJECTIVE:  BP 124/82   Ht 5\' 9"  (1.753 m)   Wt 216 lb (98 kg)   BMI 31.90 kg/m  The patient appears well, alert, oriented x 3, in no distress. PELVIC EXAM: Deferred to future visit  ASSESSMENT:  49 y.o. G0P0000 here for postmenopausal symptoms  PLAN:  -We discussed symptoms and menopause relating to estrogen deficiency.  Hormone replacement therapy can be considered in people who do not have any contraindication.  She has had a prior hysterectomy so she not require progestins.  We discussed increased risks of HRT to include heart attack, stroke, DVT, PE, breast cancer.  She would like to think about this and will let us know how she feels at her next visit. -We also discussed that mood changes and irritability can sometimes also suggest an underlying mental health concern, and there are plenty of stressors present, specially recently, that have put on these kinds of changes for many people.  Trialing hormonal therapy may help to make this distinction. -I recommend returning for routine annual exam and Pap smear in the next several weeks.  Joseph Pierini MD, FACOG  04/27/19

## 2019-04-27 NOTE — Patient Instructions (Signed)
Menopause and Hormone Replacement Therapy Menopause is a normal time of life when menstrual periods stop completely and the ovaries stop producing the female hormones estrogen and progesterone. This lack of hormones can affect your health and cause undesirable symptoms. Hormone replacement therapy (HRT) can relieve some of those symptoms. What is hormone replacement therapy? HRT is the use of artificial (synthetic) hormones to replace hormones that your body has stopped producing because you have reached menopause. What are my options for HRT?  HRT may consist of the synthetic hormones estrogen and progestin, or it may consist of only estrogen (estrogen-only therapy). You and your health care provider will decide which form of HRT is best for you. If you choose to be on HRT and you have a uterus, estrogen and progestin are usually prescribed. Estrogen-only therapy is used for women who do not have a uterus. Possible options for taking HRT include:  Pills.  Patches.  Gels.  Sprays.  Vaginal cream.  Vaginal rings.  Vaginal inserts. The amount of hormone(s) that you take and how long you take the hormone(s) varies according to your health. It is important to:  Begin HRT with the lowest possible dosage.  Stop HRT as soon as your health care provider tells you to stop.  Work with your health care provider so that you feel informed and comfortable with your decisions. What are the benefits of HRT? HRT can reduce the frequency and severity of menopausal symptoms. Benefits of HRT vary according to the kind of symptoms that you have, how severe they are, and your overall health. HRT may help to improve the following symptoms of menopause:  Hot flashes and night sweats. These are sudden feelings of heat that spread over the face and body. The skin may turn red, like a blush. Night sweats are hot flashes that happen while you are sleeping or trying to sleep.  Bone loss (osteoporosis). The  body loses calcium more quickly after menopause, causing the bones to become weaker. This can increase the risk for bone breaks (fractures).  Vaginal dryness. The lining of the vagina can become thin and dry, which can cause pain during sex or cause infection, burning, or itching.  Urinary tract infections.  Urinary incontinence. This is the inability to control when you pass urine.  Irritability.  Short-term memory problems. What are the risks of HRT? Risks of HRT vary depending on your individual health and medical history. Risks of HRT also depend on whether you receive both estrogen and progestin or you receive estrogen only. HRT may increase the risk of:  Spotting. This is when a small amount of blood leaks from the vagina unexpectedly.  Endometrial cancer. This cancer is in the lining of the uterus (endometrium).  Breast cancer.  Increased density of breast tissue. This can make it harder to find breast cancer on a breast X-ray (mammogram).  Stroke.  Heart disease.  Blood clots.  Gallbladder disease.  Liver disease. Risks of HRT can increase if you have any of the following conditions:  Endometrial cancer.  Liver disease.  Heart disease.  Breast cancer.  History of blood clots.  History of stroke. Follow these instructions at home:  Take over-the-counter and prescription medicines only as told by your health care provider.  Get mammograms, pelvic exams, and medical checkups as often as told by your health care provider.  Have Pap tests done as often as told by your health care provider. A Pap test is sometimes called a Pap smear. It   is a screening test that is used to check for signs of cancer of the cervix and vagina. A Pap test can also identify the presence of infection or precancerous changes. Pap tests may be done: ? Every 3 years, starting at age 21. ? Every 5 years, starting after age 30, in combination with testing for human papillomavirus  (HPV). ? More often or less often depending on other medical conditions you have, your age, and other risk factors.  It is up to you to get the results of your Pap test. Ask your health care provider, or the department that is doing the test, when your results will be ready.  Keep all follow-up visits as told by your health care provider. This is important. Contact a health care provider if you have:  Pain or swelling in your legs.  Shortness of breath.  Chest pain.  Lumps or changes in your breasts or armpits.  Slurred speech.  Pain, burning, or bleeding when you urinate.  Unusual vaginal bleeding.  Dizziness or headaches.  Weakness or numbness in any part of your arms or legs.  Pain in your abdomen. Summary  Menopause is a normal time of life when menstrual periods stop completely and the ovaries stop producing the female hormones estrogen and progesterone.  Hormone replacement therapy (HRT) can relieve some of the symptoms of menopause.  HRT can reduce the frequency and severity of menopausal symptoms.  Risks of HRT vary depending on your individual health and medical history. This information is not intended to replace advice given to you by your health care provider. Make sure you discuss any questions you have with your health care provider. Document Revised: 09/17/2017 Document Reviewed: 09/17/2017 Elsevier Patient Education  2020 Elsevier Inc.  

## 2019-05-06 ENCOUNTER — Ambulatory Visit: Payer: 59

## 2019-06-01 MED FILL — AMLODIPINE BESYLATE 5 MG TA: 5 | 30 days supply | Qty: 30 | Fill #0

## 2019-06-04 ENCOUNTER — Other Ambulatory Visit (HOSPITAL_COMMUNITY): Payer: Self-pay | Admitting: Dermatology

## 2019-06-04 DIAGNOSIS — L819 Disorder of pigmentation, unspecified: Secondary | ICD-10-CM | POA: Insufficient documentation

## 2019-06-04 DIAGNOSIS — L700 Acne vulgaris: Secondary | ICD-10-CM | POA: Insufficient documentation

## 2019-06-04 MED FILL — CLIND PH-BENZOYL PEROX 1.2-: 1.2-5 | 30 days supply | Qty: 45 | Fill #0

## 2019-06-24 ENCOUNTER — Ambulatory Visit (INDEPENDENT_AMBULATORY_CARE_PROVIDER_SITE_OTHER): Payer: No Typology Code available for payment source | Admitting: Emergency Medicine

## 2019-06-24 ENCOUNTER — Encounter: Payer: Self-pay | Admitting: Emergency Medicine

## 2019-06-24 ENCOUNTER — Other Ambulatory Visit: Payer: Self-pay

## 2019-06-24 VITALS — BP 142/84 | HR 75 | Temp 97.8°F | Ht 69.0 in | Wt 220.2 lb

## 2019-06-24 DIAGNOSIS — M542 Cervicalgia: Secondary | ICD-10-CM

## 2019-06-24 DIAGNOSIS — L918 Other hypertrophic disorders of the skin: Secondary | ICD-10-CM

## 2019-06-24 DIAGNOSIS — M62838 Other muscle spasm: Secondary | ICD-10-CM | POA: Diagnosis not present

## 2019-06-24 MED ORDER — MELOXICAM 15 MG PO TABS: 15.0000 mg | ORAL_TABLET | Freq: Every day | ORAL | 0 refills | Status: DC

## 2019-06-24 MED ORDER — CYCLOBENZAPRINE HCL 10 MG PO TABS: 10.0000 mg | ORAL_TABLET | Freq: Every day | ORAL | 0 refills | Status: DC

## 2019-06-24 MED FILL — CYCLOBENZAPRINE HCL 10 MG T: 10 | 30 days supply | Qty: 30 | Fill #0

## 2019-06-24 MED FILL — MELOXICAM 15 MG TABLET: 15 | 30 days supply | Qty: 30 | Fill #0

## 2019-06-24 MED FILL — TRETINOIN 0.025% CREAM: 0.025 | 30 days supply | Qty: 45 | Fill #0

## 2019-06-24 NOTE — Patient Instructions (Signed)
Cervical Sprain  A cervical sprain is a stretch or tear in the tissues that connect bones (ligaments) in the neck. Most neck (cervical) sprains get better in 4-6 weeks. Follow these instructions at home: If you have a neck collar:  Wear it as told by your doctor. Do not take off (do not remove) the collar unless your doctor says that this is safe.  Ask your doctor before adjusting your collar.  If you have long hair, keep it outside of the collar.  Ask your doctor if you may take off the collar for cleaning and bathing. If you may take off the collar: ? Follow instructions from your doctor about how to take off the collar safely. ? Clean the collar by wiping it with mild soap and water. Let it air-dry all the way. ? If your collar has removable pads:  Take the pads out every 1-2 days.  Hand wash the pads with soap and water.  Let the pads air-dry all the way before you put them back in the collar. Do not dry them in a clothes dryer. Do not dry them with a hair dryer. ? Check your skin under the collar for irritation or sores. If you see any, tell your doctor. Managing pain, stiffness, and swelling   Use a cervical traction device, if told by your doctor.  If told, put heat on the affected area. Do this before exercises (physical therapy) or as often as told by your doctor. Use the heat source that your doctor recommends, such as a moist heat pack or a heating pad. ? Place a towel between your skin and the heat source. ? Leave the heat on for 20-30 minutes. ? Take the heat off (remove the heat) if your skin turns bright red. This is very important if you cannot feel pain, heat, or cold. You may have a greater risk of getting burned.  Put ice on the affected area. ? Put ice in a plastic bag. ? Place a towel between your skin and the bag. ? Leave the ice on for 20 minutes, 2-3 times a day. Activity  Do not drive while wearing a neck collar. If you do not have a neck collar, ask  your doctor if it is safe to drive.  Do not drive or use heavy machinery while taking prescription pain medicine or muscle relaxants, unless your doctor approves.  Do not lift anything that is heavier than 10 lb (4.5 kg) until your doctor tells you that it is safe.  Rest as told by your doctor.  Avoid activities that make you feel worse. Ask your doctor what activities are safe for you.  Do exercises as told by your doctor or physical therapist. Preventing neck sprain  Practice good posture. Adjust your workstation to help with this, if needed.  Exercise regularly as told by your doctor or physical therapist.  Avoid activities that are risky or may cause a neck sprain (cervical sprain). General instructions  Take over-the-counter and prescription medicines only as told by your doctor.  Do not use any products that contain nicotine or tobacco. This includes cigarettes and e-cigarettes. If you need help quitting, ask your doctor.  Keep all follow-up visits as told by your doctor. This is important. Contact a doctor if:  You have pain or other symptoms that get worse.  You have symptoms that do not get better after 2 weeks.  You have pain that does not get better with medicine.  You start to   have new, unexplained symptoms.  You have sores or irritated skin from wearing your neck collar. Get help right away if:  You have very bad pain.  You have any of the following in any part of your body: ? Loss of feeling (numbness). ? Tingling. ? Weakness.  You cannot move a part of your body (you have paralysis).  Your activity level does not improve. Summary  A cervical sprain is a stretch or tear in the tissues that connect bones (ligaments) in the neck.  If you have a neck (cervical) collar, do not take off the collar unless your doctor says that this is safe.  Put ice on affected areas as told by your doctor.  Put heat on affected areas as told by your doctor.  Good  posture and regular exercise can help prevent a neck sprain from happening again. This information is not intended to replace advice given to you by your health care provider. Make sure you discuss any questions you have with your health care provider. Document Revised: 05/07/2018 Document Reviewed: 09/27/2015 Elsevier Patient Education  2020 Elsevier Inc.  

## 2019-06-24 NOTE — Progress Notes (Signed)
Deanna Cook 49 y.o.   Chief Complaint  Patient presents with  . neck stiffness    4 weeks. can not turn left and right all the way to shoulders    HISTORY OF PRESENT ILLNESS: This is a 50 y.o. female complaining of sharp bilateral neck pain that started 4 weeks ago, progressively getting worse with some radiation into her head and shoulders.  Denies injury.  No other associated symptoms.  Limited range of motion when turning head. Also inquiring about a skin tag removal.  Will need dermatology referral. No other complaints or medical concerns today. Fully vaccinated against Covid.  HPI   Prior to Admission medications   Medication Sig Start Date End Date Taking? Authorizing Provider  amLODipine (NORVASC) 5 MG tablet Take 1 tablet (5 mg total) by mouth daily. 02/11/19  Yes Billie Ruddy, MD  APPLE CIDER VINEGAR PO Take by mouth.   Yes [provider]  Azelastine-Fluticasone 137-50 MCG/ACT SUSP Place 1 spray into the nose 2 (two) times daily. 10/09/18  Yes Padgett, Rae Halsted, MD  cholecalciferol (VITAMIN D3) 25 MCG (1000 UNIT) tablet Take 1,000 Units by mouth daily.   Yes [provider]  Cyanocobalamin (B-12) 1000 MCG TABS Take 1 tablet by mouth daily.   Yes [provider]  fexofenadine (ALLEGRA) 180 MG tablet Take 180 mg by mouth daily.   Yes [provider]  fluticasone (FLONASE) 50 MCG/ACT nasal spray Place into both nostrils daily.   Yes [provider]  Multiple Vitamin (MULTIVITAMIN) tablet Take 1 tablet by mouth daily.   Yes [provider]  tretinoin (RETIN-A) 0.025 % cream Apply to face nightly to tolerance 06/04/19  Yes [provider]    No Known Allergies  Patient Active Problem List   Diagnosis Date Noted  . Essential hypertension 02/11/2019  . Refusal of blood transfusions as patient is Jehovah's Witness 08/07/2018  . Seasonal allergies 08/07/2018    Past Medical History:  Diagnosis Date    . Allergic rhinitis   . Fibroid     Past Surgical History:  Procedure Laterality Date  . ABDOMINAL HYSTERECTOMY     [facility]-2014  . APPENDECTOMY  1989    Social History   Socioeconomic History  . Marital status: Married    Spouse name: Not on file  . Number of children: Not on file  . Years of education: Not on file  . Highest education level: Not on file  Occupational History  . Not on file  Tobacco Use  . Smoking status: Never Smoker  . Smokeless tobacco: Never Used  Substance and Sexual Activity  . Alcohol use: Yes    Comment: social  . Drug use: Never  . Sexual activity: Yes    Comment: 1st intercourse 49 yo-More than 5 partners  Other Topics Concern  . Not on file  Social History Narrative  . Not on file   Social Determinants of Health   Financial Resource Strain:   . Difficulty of Paying Living Expenses:   Food Insecurity:   . Worried About Charity fundraiser in the Last Year:   . Arboriculturist in the Last Year:   Transportation Needs:   . Film/video editor (Medical):   Marland Kitchen Lack of Transportation (Non-Medical):   Physical Activity:   . Days of Exercise per Week:   . Minutes of Exercise per Session:   Stress:   . Feeling of Stress :   Social Connections:   .  Frequency of Communication with Friends and Family:   . Frequency of Social Gatherings with Friends and Family:   . Attends Religious Services:   . Active Member of Clubs or Organizations:   . Attends Archivist Meetings:   Marland Kitchen Marital Status:   Intimate Partner Violence:   . Fear of Current or Ex-Partner:   . Emotionally Abused:   Marland Kitchen Physically Abused:   . Sexually Abused:     Family History  Problem Relation Age of Onset  . Hypertension Mother   . Colon cancer Mother   . Heart attack Father   . Diabetes Mellitus I Father   . Kidney failure Father   . Thyroid cancer Sister   . Kidney disease Brother      Review of Systems  Constitutional: Negative.  Negative for chills  and fever.  HENT: Negative.  Negative for congestion and sore throat.   Respiratory: Negative.  Negative for cough and shortness of breath.   Cardiovascular: Negative.  Negative for chest pain and palpitations.  Gastrointestinal: Negative for abdominal pain, blood in stool, nausea and vomiting.  Musculoskeletal: Positive for neck pain.  Neurological: Negative for dizziness and headaches.  All other systems reviewed and are negative.  Today's Vitals   06/24/19 0805  BP: (!) 160/99  Pulse: 75  Temp: 97.8 F (36.6 C)  TempSrc: Temporal  SpO2: 97%  Weight: 220 lb 3.2 oz (99.9 kg)  Height: 5\' 9"  (1.753 m)   Body mass index is 32.52 kg/m.   Physical Exam Vitals reviewed.  Constitutional:      Appearance: Normal appearance.  HENT:     Head: Normocephalic.  Eyes:     Extraocular Movements: Extraocular movements intact.     Conjunctiva/sclera: Conjunctivae normal.     Pupils: Pupils are equal, round, and reactive to light.  Cardiovascular:     Rate and Rhythm: Normal rate and regular rhythm.  Pulmonary:     Effort: Pulmonary effort is normal.  Musculoskeletal:     Cervical back: Pain with movement and muscular tenderness present. No spinous process tenderness. Decreased range of motion.  Lymphadenopathy:     Cervical: No cervical adenopathy.     Right cervical: No superficial or posterior cervical adenopathy.    Left cervical: No superficial or posterior cervical adenopathy.  Skin:    General: Skin is warm and dry.  Neurological:     General: No focal deficit present.     Mental Status: She is alert and oriented to person, place, and time.  Psychiatric:        Mood and Affect: Mood normal.        Behavior: Behavior normal.    A total of 30 minutes was spent with the patient, greater than 50% of which was in counseling/coordination of care regarding differential diagnosis of neck pain, treatment, new medications, review of most recent office visit notes, prognosis and need  for follow-up.   ASSESSMENT & PLAN: Deanna Cook was seen today for neck stiffness.  Diagnoses and all orders for this visit:  Musculoskeletal neck pain -     meloxicam (MOBIC) 15 MG tablet; Take 1 tablet (15 mg total) by mouth daily.  Muscle spasm -     cyclobenzaprine (FLEXERIL) 10 MG tablet; Take 1 tablet (10 mg total) by mouth at bedtime.  Cutaneous skin tags -     Ambulatory referral to Dermatology    Patient Instructions  Cervical Sprain  A cervical sprain is a stretch or tear in the  tissues that connect bones (ligaments) in the neck. Most neck (cervical) sprains get better in 4-6 weeks. Follow these instructions at home: If you have a neck collar:  Wear it as told by your doctor. Do not take off (do not remove) the collar unless your doctor says that this is safe.  Ask your doctor before adjusting your collar.  If you have long hair, keep it outside of the collar.  Ask your doctor if you may take off the collar for cleaning and bathing. If you may take off the collar: ? Follow instructions from your doctor about how to take off the collar safely. ? Clean the collar by wiping it with mild soap and water. Let it air-dry all the way. ? If your collar has removable pads:  Take the pads out every 1-2 days.  Hand wash the pads with soap and water.  Let the pads air-dry all the way before you put them back in the collar. Do not dry them in a clothes dryer. Do not dry them with a hair dryer. ? Check your skin under the collar for irritation or sores. If you see any, tell your doctor. Managing pain, stiffness, and swelling   Use a cervical traction device, if told by your doctor.  If told, put heat on the affected area. Do this before exercises (physical therapy) or as often as told by your doctor. Use the heat source that your doctor recommends, such as a moist heat pack or a heating pad. ? Place a towel between your skin and the heat source. ? Leave the heat on for 20-30  minutes. ? Take the heat off (remove the heat) if your skin turns bright red. This is very important if you cannot feel pain, heat, or cold. You may have a greater risk of getting burned.  Put ice on the affected area. ? Put ice in a plastic bag. ? Place a towel between your skin and the bag. ? Leave the ice on for 20 minutes, 2-3 times a day. Activity  Do not drive while wearing a neck collar. If you do not have a neck collar, ask your doctor if it is safe to drive.  Do not drive or use heavy machinery while taking prescription pain medicine or muscle relaxants, unless your doctor approves.  Do not lift anything that is heavier than 10 lb (4.5 kg) until your doctor tells you that it is safe.  Rest as told by your doctor.  Avoid activities that make you feel worse. Ask your doctor what activities are safe for you.  Do exercises as told by your doctor or physical therapist. Preventing neck sprain  Practice good posture. Adjust your workstation to help with this, if needed.  Exercise regularly as told by your doctor or physical therapist.  Avoid activities that are risky or may cause a neck sprain (cervical sprain). General instructions  Take over-the-counter and prescription medicines only as told by your doctor.  Do not use any products that contain nicotine or tobacco. This includes cigarettes and e-cigarettes. If you need help quitting, ask your doctor.  Keep all follow-up visits as told by your doctor. This is important. Contact a doctor if:  You have pain or other symptoms that get worse.  You have symptoms that do not get better after 2 weeks.  You have pain that does not get better with medicine.  You start to have new, unexplained symptoms.  You have sores or irritated skin from wearing  your neck collar. Get help right away if:  You have very bad pain.  You have any of the following in any part of your body: ? Loss of feeling  (numbness). ? Tingling. ? Weakness.  You cannot move a part of your body (you have paralysis).  Your activity level does not improve. Summary  A cervical sprain is a stretch or tear in the tissues that connect bones (ligaments) in the neck.  If you have a neck (cervical) collar, do not take off the collar unless your doctor says that this is safe.  Put ice on affected areas as told by your doctor.  Put heat on affected areas as told by your doctor.  Good posture and regular exercise can help prevent a neck sprain from happening again. This information is not intended to replace advice given to you by your health care provider. Make sure you discuss any questions you have with your health care provider. Document Revised: 05/07/2018 Document Reviewed: 09/27/2015 Elsevier Patient Education  2020 Elsevier Inc.      Agustina Caroli, MD [facility]

## 2019-07-06 ENCOUNTER — Other Ambulatory Visit: Payer: Self-pay | Admitting: Emergency Medicine

## 2019-07-06 ENCOUNTER — Telehealth: Payer: Self-pay | Admitting: Emergency Medicine

## 2019-07-06 ENCOUNTER — Telehealth: Payer: Self-pay | Admitting: *Deleted

## 2019-07-06 DIAGNOSIS — H33302 Unspecified retinal break, left eye: Secondary | ICD-10-CM

## 2019-07-06 NOTE — Telephone Encounter (Signed)
Referral signed. Thanks!

## 2019-07-06 NOTE — Telephone Encounter (Signed)
Spoke to patient about the referral to Dr Zigmund Daniel a retina specialist. The patient had an appointment at [facility] today with Gwenlyn Perking per patient.  The reason for the referral she has a hole in retina of the left eye. She was told to make appointment with PCP to schedule the referral. I advised the patient I will send a referral and she will be contacted by the specialist's office.

## 2019-07-06 NOTE — Telephone Encounter (Signed)
REFERRAL REQUEST Telephone Note  What type of referral do you need? Eye specialist that was recommended from optomologist    Have you been seen at our office for this problem? No , pt eye provider told her she needed to see the specialist and she would have to contact pcp to get referral  (Advise that they will likely need an appointment with their PCP before a referral can be done)  Is there a particular doctor or location that you prefer? Dr,matthews [place] , [id number] [place]  Patient notified that referrals can take up to a week or longer to process. If they haven't heard anything within a week they should call back and speak with the referral department.

## 2019-07-08 ENCOUNTER — Encounter (INDEPENDENT_AMBULATORY_CARE_PROVIDER_SITE_OTHER): Payer: No Typology Code available for payment source | Admitting: Ophthalmology

## 2019-07-08 ENCOUNTER — Other Ambulatory Visit: Payer: Self-pay

## 2019-07-08 DIAGNOSIS — I10 Essential (primary) hypertension: Secondary | ICD-10-CM | POA: Diagnosis not present

## 2019-07-08 DIAGNOSIS — H33302 Unspecified retinal break, left eye: Secondary | ICD-10-CM | POA: Diagnosis not present

## 2019-07-08 DIAGNOSIS — H35033 Hypertensive retinopathy, bilateral: Secondary | ICD-10-CM | POA: Diagnosis not present

## 2019-07-08 DIAGNOSIS — H43813 Vitreous degeneration, bilateral: Secondary | ICD-10-CM

## 2019-07-08 DIAGNOSIS — H35412 Lattice degeneration of retina, left eye: Secondary | ICD-10-CM | POA: Diagnosis not present

## 2019-07-09 ENCOUNTER — Encounter (INDEPENDENT_AMBULATORY_CARE_PROVIDER_SITE_OTHER): Payer: No Typology Code available for payment source | Admitting: Ophthalmology

## 2019-07-15 ENCOUNTER — Encounter (INDEPENDENT_AMBULATORY_CARE_PROVIDER_SITE_OTHER): Payer: No Typology Code available for payment source | Admitting: Ophthalmology

## 2019-07-15 ENCOUNTER — Other Ambulatory Visit: Payer: Self-pay

## 2019-07-15 DIAGNOSIS — H33302 Unspecified retinal break, left eye: Secondary | ICD-10-CM

## 2019-07-16 ENCOUNTER — Encounter: Payer: 59 | Admitting: Obstetrics and Gynecology

## 2019-07-21 ENCOUNTER — Other Ambulatory Visit: Payer: Self-pay

## 2019-07-22 ENCOUNTER — Encounter: Payer: Self-pay | Admitting: Obstetrics and Gynecology

## 2019-07-22 ENCOUNTER — Ambulatory Visit (INDEPENDENT_AMBULATORY_CARE_PROVIDER_SITE_OTHER): Payer: No Typology Code available for payment source | Admitting: Obstetrics and Gynecology

## 2019-07-22 VITALS — BP 122/80 | Ht 69.0 in | Wt 219.0 lb

## 2019-07-22 DIAGNOSIS — Z01419 Encounter for gynecological examination (general) (routine) without abnormal findings: Secondary | ICD-10-CM

## 2019-07-22 DIAGNOSIS — N951 Menopausal and female climacteric states: Secondary | ICD-10-CM | POA: Diagnosis not present

## 2019-07-22 DIAGNOSIS — Z1272 Encounter for screening for malignant neoplasm of vagina: Secondary | ICD-10-CM | POA: Diagnosis not present

## 2019-07-22 NOTE — Patient Instructions (Signed)
Please remember to schedule a routine annual mammogram (contact [facility] or [facility]) later this year and a colonoscopy for routine colon cancer screening (call one of the local GI groups in town) as soon as you can.

## 2019-07-22 NOTE — Addendum Note (Signed)
Addended by: Nelva Nay on: 07/22/2019 09:01 AM   Modules accepted: Orders

## 2019-07-22 NOTE — Progress Notes (Signed)
Deanna Cook 27-Jun-1970 [id number]  SUBJECTIVE:  49 y.o. G0P0000 female here for an annual routine gynecologic exam and Pap smear. She has no gynecologic concerns.  Follow up discussion from mood change concerns and vasomotor symptoms from last visit, symptoms are about stable.  Current Outpatient Medications  Medication Sig Dispense Refill  . amLODipine (NORVASC) 5 MG tablet Take 1 tablet (5 mg total) by mouth daily. 30 tablet 3  . APPLE CIDER VINEGAR PO Take by mouth.    . Azelastine-Fluticasone 137-50 MCG/ACT SUSP Place 1 spray into the nose 2 (two) times daily. 23 g 5  . cholecalciferol (VITAMIN D3) 25 MCG (1000 UNIT) tablet Take 1,000 Units by mouth daily.    . Cyanocobalamin (B-12) 1000 MCG TABS Take 1 tablet by mouth daily.    . cyclobenzaprine (FLEXERIL) 10 MG tablet Take 1 tablet (10 mg total) by mouth at bedtime. 30 tablet 0  . fexofenadine (ALLEGRA) 180 MG tablet Take 180 mg by mouth daily.    . fluticasone (FLONASE) 50 MCG/ACT nasal spray Place into both nostrils daily.    . meloxicam (MOBIC) 15 MG tablet Take 1 tablet (15 mg total) by mouth daily. 30 tablet 0  . tretinoin (RETIN-A) 0.025 % cream Apply to face nightly to tolerance     No current facility-administered medications for this visit.   Allergies: Patient has no known allergies.  No LMP recorded. Patient has had a hysterectomy.  Past medical history,surgical history, problem list, medications, allergies, family history and social history were all reviewed and documented as reviewed in the EPIC chart.  ROS:  Feeling well. No dyspnea or chest pain on exertion.  No abdominal pain, change in bowel habits, black or bloody stools.  No urinary tract symptoms. GYN ROS: no abnormal bleeding, pelvic pain or discharge, no breast pain or new or enlarging lumps on self exam. No neurological complaints.   OBJECTIVE:  BP 122/80   Ht 5\' 9"  (1.753 m)   Wt 219 lb (99.3 kg)   BMI 32.34 kg/m  The patient appears well, alert,  oriented x 3, in no distress. ENT normal.  Neck supple. No cervical or supraclavicular adenopathy or thyromegaly.  Lungs are clear, good air entry, no wheezes, rhonchi or rales. S1 and S2 normal, no murmurs, regular rate and rhythm.  Abdomen soft without tenderness, guarding, mass or organomegaly.  Neurological is normal, no focal findings.  BREAST EXAM: breasts appear normal, no suspicious masses, no skin or nipple changes or axillary nodes  PELVIC EXAM: VULVA: normal appearing vulva with no masses, tenderness or lesions, VAGINA: normal appearing vagina with normal color and discharge, no lesions, CERVIX: surgically absent, UTERUS: surgically absent, vaginal cuff normal, ADNEXA: no masses, non tender, PAP: Pap smear done today, thin-prep method  Chaperone: Caryn Bee present during the examination  ASSESSMENT:  49 y.o. G0P0000 here for annual gynecologic exam  PLAN:   1. Perimenopausal vs postmenopausal. Prior LAVH in 2014 for fibroids.  Vasomotor symptoms, decreased libido, mood change all consistent with peri/postmenopausal symptoms.  Will check FSH.  HRT vs other therapies reviewed and she will decide what she would like to do after the [facility] result and further thought. 2. Pap smear 2014.  No significant history of abnormal Pap smears. Pap smear of vaginal cuff obtained today. 3. Mammogram 12/2018.  Normal breast exam today.  She is reminded to schedule an annual mammogram this year when due. 4. Screening colonoscopy was highly recommended today.  Mother with colon cancer recently diagnosed.  Will provide contact information for GI at checkout upon request. 5. Health maintenance.  Normally has routine screening labs completed with her primary care doctor.   Return annually or sooner, prn.  Joseph Pierini MD 07/22/19

## 2019-07-23 LAB — PAP IG W/ RFLX HPV ASCU

## 2019-07-23 LAB — FOLLICLE STIMULATING HORMONE: FSH: 9.3 m[IU]/mL

## 2019-09-07 MED FILL — AMLODIPINE BESYLATE 5 MG TA: 5 | 30 days supply | Qty: 30 | Fill #1

## 2019-09-16 ENCOUNTER — Other Ambulatory Visit: Payer: Self-pay

## 2019-09-16 ENCOUNTER — Ambulatory Visit (INDEPENDENT_AMBULATORY_CARE_PROVIDER_SITE_OTHER): Payer: No Typology Code available for payment source | Admitting: Emergency Medicine

## 2019-09-16 ENCOUNTER — Other Ambulatory Visit: Payer: Self-pay | Admitting: Emergency Medicine

## 2019-09-16 ENCOUNTER — Encounter: Payer: Self-pay | Admitting: Emergency Medicine

## 2019-09-16 VITALS — BP 130/78 | HR 77 | Temp 97.8°F | Ht 69.0 in | Wt 227.0 lb

## 2019-09-16 DIAGNOSIS — I10 Essential (primary) hypertension: Secondary | ICD-10-CM

## 2019-09-16 DIAGNOSIS — M542 Cervicalgia: Secondary | ICD-10-CM

## 2019-09-16 MED ORDER — AMLODIPINE BESYLATE 5 MG PO TABS: 5.0000 mg | ORAL_TABLET | Freq: Every day | ORAL | 3 refills | Status: DC

## 2019-09-16 NOTE — Patient Instructions (Signed)

## 2019-09-16 NOTE — Assessment & Plan Note (Signed)
Slightly elevated systolic blood pressure.  Advised to monitor blood pressure readings at home.  Continue amlodipine 5 mg daily.  Otherwise no concerns. Follow-up in 6 months.

## 2019-09-16 NOTE — Progress Notes (Signed)
Deanna Cook 49 y.o.   Chief Complaint  Patient presents with  . Hypertension    6 m f/u   . neck pain still going    HISTORY OF PRESENT ILLNESS: This is a 49 y.o. female with history of hypertension here for follow-up and medication refill. Still has some chronic neck muscle soreness issues for several months. No other complaints or medical concerns. Recently saw her GYN doctor.  Normal Pap smear.  No concerns. Recent blood work showed prediabetes.  Otherwise unremarkable labs with acceptable values.  HPI   Prior to Admission medications   Medication Sig Start Date End Date Taking? Authorizing Provider  amLODipine (NORVASC) 5 MG tablet Take 1 tablet (5 mg total) by mouth daily. 02/11/19  Yes Billie Ruddy, MD  APPLE CIDER VINEGAR PO Take by mouth.   Yes [provider]  Azelastine-Fluticasone 137-50 MCG/ACT SUSP Place 1 spray into the nose 2 (two) times daily. 10/09/18  Yes Padgett, Rae Halsted, MD  cholecalciferol (VITAMIN D3) 25 MCG (1000 UNIT) tablet Take 1,000 Units by mouth daily.   Yes [provider]  Clindamycin-Benzoyl Per, Refr, gel APPLY TO FACE EVERY MORNING 07/06/19  Yes [provider]  Cyanocobalamin (B-12) 1000 MCG TABS Take 1 tablet by mouth daily.   Yes [provider]  cyclobenzaprine (FLEXERIL) 10 MG tablet Take 1 tablet (10 mg total) by mouth at bedtime. 06/24/19  Yes Chrys Landgrebe, Ines Bloomer, MD  fexofenadine (ALLEGRA) 180 MG tablet Take 180 mg by mouth daily.   Yes [provider]  fluticasone (FLONASE) 50 MCG/ACT nasal spray Place into both nostrils daily.   Yes [provider]  meloxicam (MOBIC) 15 MG tablet Take 1 tablet (15 mg total) by mouth daily. 06/24/19  Yes [place], Ines Bloomer, MD  tretinoin (RETIN-A) 0.025 % cream Apply to face nightly to tolerance 06/04/19  Yes [provider]    No Known Allergies  Patient Active Problem List   Diagnosis Date Noted  . Essential hypertension  02/11/2019  . Refusal of blood transfusions as patient is Jehovah's Witness 08/07/2018  . Seasonal allergies 08/07/2018    Past Medical History:  Diagnosis Date  . Allergic rhinitis   . Fibroid   . Hypertension     Past Surgical History:  Procedure Laterality Date  . ABDOMINAL HYSTERECTOMY     LAVH-2014  . APPENDECTOMY  1989  . EYE SURGERY     Hole in retina    Social History   Socioeconomic History  . Marital status: Married    Spouse name: Not on file  . Number of children: Not on file  . Years of education: Not on file  . Highest education level: Not on file  Occupational History  . Not on file  Tobacco Use  . Smoking status: Never Smoker  . Smokeless tobacco: Never Used  Vaping Use  . Vaping Use: Never used  Substance and Sexual Activity  . Alcohol use: Yes    Comment: social  . Drug use: Never  . Sexual activity: Yes    Birth control/protection: Surgical    Comment: 1st intercourse 49 yo-More than 5 partners  Other Topics Concern  . Not on file  Social History Narrative  . Not on file   Social Determinants of Health   Financial Resource Strain:   . Difficulty of Paying Living Expenses:   Food Insecurity:   . Worried About Charity fundraiser in the Last Year:   . [employer]  in the Last Year:   Transportation Needs:   . Film/video editor (Medical):   Marland Kitchen Lack of Transportation (Non-Medical):   Physical Activity:   . Days of Exercise per Week:   . Minutes of Exercise per Session:   Stress:   . Feeling of Stress :   Social Connections:   . Frequency of Communication with Friends and Family:   . Frequency of Social Gatherings with Friends and Family:   . Attends Religious Services:   . Active Member of Clubs or Organizations:   . Attends Archivist Meetings:   Marland Kitchen Marital Status:   Intimate Partner Violence:   . Fear of Current or Ex-Partner:   . Emotionally Abused:   Marland Kitchen Physically Abused:   . Sexually Abused:     Family  History  Problem Relation Age of Onset  . Hypertension Mother   . Colon cancer Mother   . Heart attack Father   . Diabetes Mellitus I Father   . Kidney failure Father   . Thyroid cancer Sister   . Kidney disease Brother      Review of Systems  Constitutional: Negative.  Negative for chills and fever.  HENT: Negative.  Negative for congestion and sore throat.   Respiratory: Negative.  Negative for cough and shortness of breath.   Cardiovascular: Negative.  Negative for chest pain and palpitations.  Gastrointestinal: Negative.  Negative for abdominal pain, diarrhea, nausea and vomiting.  Genitourinary: Negative for dysuria and hematuria.  Musculoskeletal: Positive for neck pain.       Intermittent left index finger morning pains with normal range of motion  Skin: Negative.   Neurological: Negative for dizziness and headaches.  All other systems reviewed and are negative.  Today's Vitals   09/16/19 0842 09/16/19 0849  BP: (!) 146/90 130/78  Pulse: 77   Temp: 97.8 F (36.6 C)   TempSrc: Temporal   SpO2: 96%   Weight: 227 lb (103 kg)   Height: 5\' 9"  (1.753 m)    Body mass index is 33.52 kg/m.   Physical Exam Vitals reviewed.  Constitutional:      Appearance: Normal appearance.  HENT:     Head: Normocephalic.  Eyes:     Extraocular Movements: Extraocular movements intact.     Pupils: Pupils are equal, round, and reactive to light.  Neck:     Vascular: No carotid bruit.  Cardiovascular:     Rate and Rhythm: Normal rate and regular rhythm.     Pulses: Normal pulses.     Heart sounds: Normal heart sounds.  Pulmonary:     Effort: Pulmonary effort is normal.     Breath sounds: Normal breath sounds.  Musculoskeletal:        General: Normal range of motion.     Cervical back: Normal range of motion and neck supple. No tenderness.  Lymphadenopathy:     Cervical: No cervical adenopathy.  Skin:    General: Skin is warm and dry.  Neurological:     General: No focal  deficit present.     Mental Status: She is alert and oriented to person, place, and time.  Psychiatric:        Mood and Affect: Mood normal.        Behavior: Behavior normal.      ASSESSMENT & PLAN: Essential hypertension Slightly elevated systolic blood pressure.  Advised to monitor blood pressure readings at home.  Continue amlodipine 5 mg daily.  Otherwise no concerns. Follow-up in 6  months.  Addalynn was seen today for hypertension and neck pain still going.  Diagnoses and all orders for this visit:  Essential hypertension -     amLODipine (NORVASC) 5 MG tablet; Take 1 tablet (5 mg total) by mouth daily.  Musculoskeletal neck pain     Patient Instructions  Hypertension, Adult High blood pressure (hypertension) is when the force of blood pumping through the arteries is too strong. The arteries are the blood vessels that carry blood from the heart throughout the body. Hypertension forces the heart to work harder to pump blood and may cause arteries to become narrow or stiff. Untreated or uncontrolled hypertension can cause a heart attack, heart failure, a stroke, kidney disease, and other problems. A blood pressure reading consists of a higher number over a lower number. Ideally, your blood pressure should be below 120/80. The first ("top") number is called the systolic pressure. It is a measure of the pressure in your arteries as your heart beats. The second ("bottom") number is called the diastolic pressure. It is a measure of the pressure in your arteries as the heart relaxes. What are the causes? The exact cause of this condition is not known. There are some conditions that result in or are related to high blood pressure. What increases the risk? Some risk factors for high blood pressure are under your control. The following factors may make you more likely to develop this condition:  Smoking.  Having type 2 diabetes mellitus, high cholesterol, or both.  Not getting enough  exercise or physical activity.  Being overweight.  Having too much fat, sugar, calories, or salt (sodium) in your diet.  Drinking too much alcohol. Some risk factors for high blood pressure may be difficult or impossible to change. Some of these factors include:  Having chronic kidney disease.  Having a family history of high blood pressure.  Age. Risk increases with age.  Race. You may be at higher risk if you are African American.  Gender. Men are at higher risk than women before age 53. After age 13, women are at higher risk than men.  Having obstructive sleep apnea.  Stress. What are the signs or symptoms? High blood pressure may not cause symptoms. Very high blood pressure (hypertensive crisis) may cause:  Headache.  Anxiety.  Shortness of breath.  Nosebleed.  Nausea and vomiting.  Vision changes.  Severe chest pain.  Seizures. How is this diagnosed? This condition is diagnosed by measuring your blood pressure while you are seated, with your arm resting on a flat surface, your legs uncrossed, and your feet flat on the floor. The cuff of the blood pressure monitor will be placed directly against the skin of your upper arm at the level of your heart. It should be measured at least twice using the same arm. Certain conditions can cause a difference in blood pressure between your right and left arms. Certain factors can cause blood pressure readings to be lower or higher than normal for a short period of time:  When your blood pressure is higher when you are in a health care provider's office than when you are at home, this is called white coat hypertension. Most people with this condition do not need medicines.  When your blood pressure is higher at home than when you are in a health care provider's office, this is called masked hypertension. Most people with this condition may need medicines to control blood pressure. If you have a high blood pressure reading during  one visit or you have normal blood pressure with other risk factors, you may be asked to:  Return on a different day to have your blood pressure checked again.  Monitor your blood pressure at home for 1 week or longer. If you are diagnosed with hypertension, you may have other blood or imaging tests to help your health care provider understand your overall risk for other conditions. How is this treated? This condition is treated by making healthy lifestyle changes, such as eating healthy foods, exercising more, and reducing your alcohol intake. Your health care provider may prescribe medicine if lifestyle changes are not enough to get your blood pressure under control, and if:  Your systolic blood pressure is above 130.  Your diastolic blood pressure is above 80. Your personal target blood pressure may vary depending on your medical conditions, your age, and other factors. Follow these instructions at home: Eating and drinking   Eat a diet that is high in fiber and potassium, and low in sodium, added sugar, and fat. An example eating plan is called the DASH (Dietary Approaches to Stop Hypertension) diet. To eat this way: ? Eat plenty of fresh fruits and vegetables. Try to fill one half of your plate at each meal with fruits and vegetables. ? Eat whole grains, such as whole-wheat pasta, brown rice, or whole-grain bread. Fill about one fourth of your plate with whole grains. ? Eat or drink low-fat dairy products, such as skim milk or low-fat yogurt. ? Avoid fatty cuts of meat, processed or cured meats, and poultry with skin. Fill about one fourth of your plate with lean proteins, such as fish, chicken without skin, beans, eggs, or tofu. ? Avoid pre-made and processed foods. These tend to be higher in sodium, added sugar, and fat.  Reduce your daily sodium intake. Most people with hypertension should eat less than 1,500 mg of sodium a day.  Do not drink alcohol if: ? Your health care provider  tells you not to drink. ? You are pregnant, may be pregnant, or are planning to become pregnant.  If you drink alcohol: ? Limit how much you use to:  0-1 drink a day for women.  0-2 drinks a day for men. ? Be aware of how much alcohol is in your drink. In the [place] one drink equals one 12 oz bottle of beer (355 mL), one 5 oz glass of wine (148 mL), or one 1 oz glass of hard liquor (44 mL). Lifestyle   Work with your health care provider to maintain a healthy body weight or to lose weight. Ask what an ideal weight is for you.  Get at least 30 minutes of exercise most days of the week. Activities may include walking, swimming, or biking.  Include exercise to strengthen your muscles (resistance exercise), such as Pilates or lifting weights, as part of your weekly exercise routine. Try to do these types of exercises for 30 minutes at least 3 days a week.  Do not use any products that contain nicotine or tobacco, such as cigarettes, e-cigarettes, and chewing tobacco. If you need help quitting, ask your health care provider.  Monitor your blood pressure at home as told by your health care provider.  Keep all follow-up visits as told by your health care provider. This is important. Medicines  Take over-the-counter and prescription medicines only as told by your health care provider. Follow directions carefully. Blood pressure medicines must be taken as prescribed.  Do not skip doses of blood  pressure medicine. Doing this puts you at risk for problems and can make the medicine less effective.  Ask your health care provider about side effects or reactions to medicines that you should watch for. Contact a health care provider if you:  Think you are having a reaction to a medicine you are taking.  Have headaches that keep coming back (recurring).  Feel dizzy.  Have swelling in your ankles.  Have trouble with your vision. Get help right away if you:  Develop a severe headache or  confusion.  Have unusual weakness or numbness.  Feel faint.  Have severe pain in your chest or abdomen.  Vomit repeatedly.  Have trouble breathing. Summary  Hypertension is when the force of blood pumping through your arteries is too strong. If this condition is not controlled, it may put you at risk for serious complications.  Your personal target blood pressure may vary depending on your medical conditions, your age, and other factors. For most people, a normal blood pressure is less than 120/80.  Hypertension is treated with lifestyle changes, medicines, or a combination of both. Lifestyle changes include losing weight, eating a healthy, low-sodium diet, exercising more, and limiting alcohol. This information is not intended to replace advice given to you by your health care provider. Make sure you discuss any questions you have with your health care provider. Document Revised: 09/25/2017 Document Reviewed: 09/25/2017 Elsevier Patient Education  2020 Elsevier Inc.      Agustina Caroli, MD [facility]

## 2019-10-02 ENCOUNTER — Encounter: Payer: Self-pay | Admitting: Physician Assistant

## 2019-10-02 ENCOUNTER — Other Ambulatory Visit: Payer: Self-pay

## 2019-10-02 ENCOUNTER — Ambulatory Visit (INDEPENDENT_AMBULATORY_CARE_PROVIDER_SITE_OTHER): Payer: No Typology Code available for payment source | Admitting: Physician Assistant

## 2019-10-02 DIAGNOSIS — B079 Viral wart, unspecified: Secondary | ICD-10-CM

## 2019-10-02 DIAGNOSIS — Q825 Congenital non-neoplastic nevus: Secondary | ICD-10-CM

## 2019-10-02 DIAGNOSIS — L918 Other hypertrophic disorders of the skin: Secondary | ICD-10-CM

## 2019-10-02 NOTE — Progress Notes (Signed)
° °  New Patient   Subjective  Deanna Cook is a 49 y.o.AA female who presents for the following: Establish Care and Skin Tag (bra line).   The following portions of the chart were reviewed this encounter and updated as appropriate: Tobacco   Allergies   Meds   Problems   Med Hx   Surg Hx   Fam Hx       Objective  Well appearing patient in no apparent distress; mood and affect are within normal limits.  All skin waist up examined.  Objective  Chest - Medial [facility]): Verrucous papules -- Discussed viral etiology and contagion.   Objective  Left Abdomen (side) - Upper, Left Axilla, Right Axilla, Right Flank: Fleshy, skin-colored sessile and pedunculated papules.    Objective  Right Shoulder: Diffuse red patch entire R shoulder complex   Assessment & Plan  Viral warts, unspecified type Chest - Medial (Center)  Destruction of lesion - Chest - Medial (Center) Complexity: simple   Destruction method: electrodesiccation and curettage   Destruction method comment:  Scissors were used to snip tag at the base Informed consent: discussed and consent obtained   Timeout:  patient name, date of birth, surgical site, and procedure verified Anesthesia: the lesion was anesthetized in a standard fashion     Curettage performed in three different directions: No   Electrodesiccation performed over the curetted area: Yes   Hemostasis achieved with:  pressure Outcome: patient tolerated procedure well with no complications   Post-procedure details: wound care instructions given    Skin tag (4) Left Abdomen (side) - Upper; Right Flank; Left Axilla; Right Axilla  Destruction of lesion - Left Abdomen (side) - Upper, Left Axilla, Right Axilla, Right Flank Complexity: simple   Destruction method comment:  Scissors were used to snip tag at the base Informed consent: discussed and consent obtained   Timeout:  patient name, date of birth, surgical site, and procedure verified Anesthesia: the  lesion was anesthetized in a standard fashion   Hemostasis achieved with:  pressure Outcome: patient tolerated procedure well with no complications   Post-procedure details: wound care instructions given    Nevus flammeus Right Shoulder  observe     I, Rolando Hessling, PA-C, have reviewed all documentation for this visit. The documentation on 10/02/19 for the exam, diagnosis, procedures, and orders are all accurate and complete.

## 2019-10-06 ENCOUNTER — Encounter: Payer: Self-pay | Admitting: Physician Assistant

## 2019-11-03 ENCOUNTER — Other Ambulatory Visit: Payer: No Typology Code available for payment source

## 2019-11-03 DIAGNOSIS — Z20822 Contact with and (suspected) exposure to covid-19: Secondary | ICD-10-CM

## 2019-11-05 LAB — NOVEL CORONAVIRUS, NAA: SARS-CoV-2, NAA: NOT DETECTED

## 2019-11-05 LAB — SARS-COV-2, NAA 2 DAY TAT

## 2019-11-16 ENCOUNTER — Encounter (INDEPENDENT_AMBULATORY_CARE_PROVIDER_SITE_OTHER): Payer: No Typology Code available for payment source | Admitting: Ophthalmology

## 2019-11-24 ENCOUNTER — Encounter (INDEPENDENT_AMBULATORY_CARE_PROVIDER_SITE_OTHER): Payer: No Typology Code available for payment source | Admitting: Ophthalmology

## 2019-11-24 ENCOUNTER — Other Ambulatory Visit: Payer: Self-pay

## 2019-11-24 DIAGNOSIS — I10 Essential (primary) hypertension: Secondary | ICD-10-CM

## 2019-11-24 DIAGNOSIS — H35033 Hypertensive retinopathy, bilateral: Secondary | ICD-10-CM | POA: Diagnosis not present

## 2019-11-24 DIAGNOSIS — H43813 Vitreous degeneration, bilateral: Secondary | ICD-10-CM

## 2019-11-24 DIAGNOSIS — H33302 Unspecified retinal break, left eye: Secondary | ICD-10-CM

## 2019-12-17 MED FILL — AMLODIPINE BESYLATE 5 MG TA: 5 | 90 days supply | Qty: 90 | Fill #0

## 2019-12-17 MED FILL — TRETINOIN 0.025% CREAM: 0.025 | 30 days supply | Qty: 45 | Fill #1

## 2020-03-16 ENCOUNTER — Ambulatory Visit: Payer: No Typology Code available for payment source | Admitting: Emergency Medicine

## 2020-03-16 ENCOUNTER — Encounter: Payer: No Typology Code available for payment source | Admitting: Emergency Medicine

## 2020-03-22 MED FILL — AMLODIPINE BESYLATE 5 MG TA: 5 | 90 days supply | Qty: 90 | Fill #1

## 2020-04-21 ENCOUNTER — Other Ambulatory Visit: Payer: Self-pay | Admitting: Emergency Medicine

## 2020-04-21 DIAGNOSIS — I10 Essential (primary) hypertension: Secondary | ICD-10-CM

## 2020-04-21 MED ORDER — AMLODIPINE BESYLATE 5 MG PO TABS: 5.0000 mg | ORAL_TABLET | Freq: Every day | ORAL | 0 refills | Status: DC

## 2020-04-21 NOTE — Telephone Encounter (Signed)
No further refills without office visit 

## 2020-04-21 NOTE — Telephone Encounter (Signed)
Medication: amLODipine (NORVASC) 5 MG tablet [[id number]]   Has the patient contacted their pharmacy? YES (Agent: If no, request that the patient contact the pharmacy for the refill.) (Agent: If yes, when and what did the pharmacy advise?)  Preferred Pharmacy (with phone number or street name): [place], [place]. [address] [place] [address] Phone: [phone] Fax: [phone] Hours: Not open 24 hours    Agent: Please be advised that RX refills may take up to 3 business days. We ask that you follow-up with your pharmacy.

## 2020-04-21 NOTE — Telephone Encounter (Signed)
Requested medication (s) are due for refill today: yes  Requested medication (s) are on the active medication list: yes  Last refill:  ?  Future visit scheduled: no  Notes to clinic:  no valid encounter within last 6 months   Requested Prescriptions  Pending Prescriptions Disp Refills   amLODipine (NORVASC) 5 MG tablet 90 tablet 3    Sig: Take 1 tablet (5 mg total) by mouth daily.      There is no refill protocol information for this order

## 2020-06-22 ENCOUNTER — Ambulatory Visit (INDEPENDENT_AMBULATORY_CARE_PROVIDER_SITE_OTHER): Payer: No Typology Code available for payment source | Admitting: Emergency Medicine

## 2020-06-22 ENCOUNTER — Encounter: Payer: Self-pay | Admitting: Emergency Medicine

## 2020-06-22 ENCOUNTER — Other Ambulatory Visit (HOSPITAL_COMMUNITY): Payer: Self-pay

## 2020-06-22 ENCOUNTER — Other Ambulatory Visit: Payer: Self-pay

## 2020-06-22 VITALS — BP 138/82 | HR 86 | Temp 98.5°F | Ht 69.0 in | Wt 220.0 lb

## 2020-06-22 DIAGNOSIS — Z13228 Encounter for screening for other metabolic disorders: Secondary | ICD-10-CM

## 2020-06-22 DIAGNOSIS — Z13220 Encounter for screening for lipoid disorders: Secondary | ICD-10-CM

## 2020-06-22 DIAGNOSIS — Z1211 Encounter for screening for malignant neoplasm of colon: Secondary | ICD-10-CM

## 2020-06-22 DIAGNOSIS — Z0001 Encounter for general adult medical examination with abnormal findings: Secondary | ICD-10-CM | POA: Diagnosis not present

## 2020-06-22 DIAGNOSIS — Z130 Encounter for screening for diseases of the blood and blood-forming organs and certain disorders involving the immune mechanism: Secondary | ICD-10-CM

## 2020-06-22 DIAGNOSIS — I10 Essential (primary) hypertension: Secondary | ICD-10-CM

## 2020-06-22 DIAGNOSIS — Z1329 Encounter for screening for other suspected endocrine disorder: Secondary | ICD-10-CM

## 2020-06-22 LAB — COMPREHENSIVE METABOLIC PANEL
ALT: 19 U/L (ref 0–35)
AST: 20 U/L (ref 0–37)
Albumin: 4.2 g/dL (ref 3.5–5.2)
Alkaline Phosphatase: 69 U/L (ref 39–117)
BUN: 7 mg/dL (ref 6–23)
CO2: 28 mEq/L (ref 19–32)
Calcium: 9.1 mg/dL (ref 8.4–10.5)
Chloride: 102 mEq/L (ref 96–112)
Creatinine, Ser: 0.81 mg/dL (ref 0.40–1.20)
GFR: 85.65 mL/min (ref >60.00)
Glucose, Bld: 93 mg/dL (ref 70–99)
Potassium: 3.6 mEq/L (ref 3.5–5.1)
Sodium: 138 mEq/L (ref 135–145)
Total Bilirubin: 0.6 mg/dL (ref 0.2–1.2)
Total Protein: 7.6 g/dL (ref 6.0–8.3)

## 2020-06-22 LAB — HEMOGLOBIN A1C: Hgb A1c MFr Bld: 5.8 % (ref 4.6–6.5)

## 2020-06-22 LAB — LIPID PANEL
Cholesterol: 172 mg/dL (ref 0–200)
HDL: 59.1 mg/dL (ref >39.00)
LDL Cholesterol: 95 mg/dL (ref 0–99)
NonHDL: 112.48
Total CHOL/HDL Ratio: 3
Triglycerides: 89 mg/dL (ref 0.0–149.0)
VLDL: 17.8 mg/dL (ref 0.0–40.0)

## 2020-06-22 LAB — CBC WITH DIFFERENTIAL/PLATELET
Basophils Absolute: 0 10*3/uL (ref 0.0–0.1)
Basophils Relative: 0.4 % (ref 0.0–3.0)
Eosinophils Absolute: 0.1 10*3/uL (ref 0.0–0.7)
Eosinophils Relative: 1.5 % (ref 0.0–5.0)
HCT: 39 % (ref 36.0–46.0)
Hemoglobin: 13.2 g/dL (ref 12.0–15.0)
Lymphocytes Relative: 28 % (ref 12.0–46.0)
Lymphs Abs: 2.6 10*3/uL (ref 0.7–4.0)
MCHC: 33.8 g/dL (ref 30.0–36.0)
MCV: 88.1 fl (ref 78.0–100.0)
Monocytes Absolute: 0.4 10*3/uL (ref 0.1–1.0)
Monocytes Relative: 4.6 % (ref 3.0–12.0)
Neutro Abs: 6 10*3/uL (ref 1.4–7.7)
Neutrophils Relative %: 65.5 % (ref 43.0–77.0)
Platelets: 283 10*3/uL (ref 150.0–400.0)
RBC: 4.43 Mil/uL (ref 3.87–5.11)
RDW: 13.1 % (ref 11.5–15.5)
WBC: 9.2 10*3/uL (ref 4.0–10.5)

## 2020-06-22 LAB — TSH: TSH: 1 u[IU]/mL (ref 0.35–4.50)

## 2020-06-22 MED ORDER — AMLODIPINE BESYLATE 10 MG PO TABS
10.0000 mg | ORAL_TABLET | Freq: Every day | ORAL | 3 refills | Status: DC
  Filled 2020-06-22: qty 90, 90d supply, fill #0
  Filled 2020-10-21: qty 90, 90d supply, fill #1
  Filled 2021-04-24: qty 90, 90d supply, fill #2

## 2020-06-22 NOTE — Patient Instructions (Addendum)
Hypertension, Adult High blood pressure (hypertension) is when the force of blood pumping through the arteries is too strong. The arteries are the blood vessels that carry blood from the heart throughout the body. Hypertension forces the heart to work harder to pump blood and may cause arteries to become narrow or stiff. Untreated or uncontrolled hypertension can cause a heart attack, heart failure, a stroke, kidney disease, and other problems. A blood pressure reading consists of a higher number over a lower number. Ideally, your blood pressure should be below 120/80. The first ("top") number is called the systolic pressure. It is a measure of the pressure in your arteries as your heart beats. The second ("bottom") number is called the diastolic pressure. It is a measure of the pressure in your arteries as the heart relaxes. What are the causes? The exact cause of this condition is not known. There are some conditions that result in or are related to high blood pressure. What increases the risk? Some risk factors for high blood pressure are under your control. The following factors may make you more likely to develop this condition:  Smoking.  Having type 2 diabetes mellitus, high cholesterol, or both.  Not getting enough exercise or physical activity.  Being overweight.  Having too much fat, sugar, calories, or salt (sodium) in your diet.  Drinking too much alcohol. Some risk factors for high blood pressure may be difficult or impossible to change. Some of these factors include:  Having chronic kidney disease.  Having a family history of high blood pressure.  Age. Risk increases with age.  Race. You may be at higher risk if you are African American.  Gender. Men are at higher risk than women before age 45. After age 65, women are at higher risk than men.  Having obstructive sleep apnea.  Stress. What are the signs or symptoms? High blood pressure may not cause symptoms. Very high  blood pressure (hypertensive crisis) may cause:  Headache.  Anxiety.  Shortness of breath.  Nosebleed.  Nausea and vomiting.  Vision changes.  Severe chest pain.  Seizures. How is this diagnosed? This condition is diagnosed by measuring your blood pressure while you are seated, with your arm resting on a flat surface, your legs uncrossed, and your feet flat on the floor. The cuff of the blood pressure monitor will be placed directly against the skin of your upper arm at the level of your heart. It should be measured at least twice using the same arm. Certain conditions can cause a difference in blood pressure between your right and left arms. Certain factors can cause blood pressure readings to be lower or higher than normal for a short period of time:  When your blood pressure is higher when you are in a health care provider's office than when you are at home, this is called white coat hypertension. Most people with this condition do not need medicines.  When your blood pressure is higher at home than when you are in a health care provider's office, this is called masked hypertension. Most people with this condition may need medicines to control blood pressure. If you have a high blood pressure reading during one visit or you have normal blood pressure with other risk factors, you may be asked to:  Return on a different day to have your blood pressure checked again.  Monitor your blood pressure at home for 1 week or longer. If you are diagnosed with hypertension, you may have other blood or   imaging tests to help your health care provider understand your overall risk for other conditions. How is this treated? This condition is treated by making healthy lifestyle changes, such as eating healthy foods, exercising more, and reducing your alcohol intake. Your health care provider may prescribe medicine if lifestyle changes are not enough to get your blood pressure under control, and  if:  Your systolic blood pressure is above 130.  Your diastolic blood pressure is above 80. Your personal target blood pressure may vary depending on your medical conditions, your age, and other factors. Follow these instructions at home: Eating and drinking  Eat a diet that is high in fiber and potassium, and low in sodium, added sugar, and fat. An example eating plan is called the DASH (Dietary Approaches to Stop Hypertension) diet. To eat this way: ? Eat plenty of fresh fruits and vegetables. Try to fill one half of your plate at each meal with fruits and vegetables. ? Eat whole grains, such as whole-wheat pasta, brown rice, or whole-grain bread. Fill about one fourth of your plate with whole grains. ? Eat or drink low-fat dairy products, such as skim milk or low-fat yogurt. ? Avoid fatty cuts of meat, processed or cured meats, and poultry with skin. Fill about one fourth of your plate with lean proteins, such as fish, chicken without skin, beans, eggs, or tofu. ? Avoid pre-made and processed foods. These tend to be higher in sodium, added sugar, and fat.  Reduce your daily sodium intake. Most people with hypertension should eat less than 1,500 mg of sodium a day.  Do not drink alcohol if: ? Your health care provider tells you not to drink. ? You are pregnant, may be pregnant, or are planning to become pregnant.  If you drink alcohol: ? Limit how much you use to:  0-1 drink a day for women.  0-2 drinks a day for men. ? Be aware of how much alcohol is in your drink. In the [place] one drink equals one 12 oz bottle of beer (355 mL), one 5 oz glass of wine (148 mL), or one 1 oz glass of hard liquor (44 mL).   Lifestyle  Work with your health care provider to maintain a healthy body weight or to lose weight. Ask what an ideal weight is for you.  Get at least 30 minutes of exercise most days of the week. Activities may include walking, swimming, or biking.  Include exercise to  strengthen your muscles (resistance exercise), such as Pilates or lifting weights, as part of your weekly exercise routine. Try to do these types of exercises for 30 minutes at least 3 days a week.  Do not use any products that contain nicotine or tobacco, such as cigarettes, e-cigarettes, and chewing tobacco. If you need help quitting, ask your health care provider.  Monitor your blood pressure at home as told by your health care provider.  Keep all follow-up visits as told by your health care provider. This is important.   Medicines  Take over-the-counter and prescription medicines only as told by your health care provider. Follow directions carefully. Blood pressure medicines must be taken as prescribed.  Do not skip doses of blood pressure medicine. Doing this puts you at risk for problems and can make the medicine less effective.  Ask your health care provider about side effects or reactions to medicines that you should watch for. Contact a health care provider if you:  Think you are having a reaction to a   medicine you are taking.  Have headaches that keep coming back (recurring).  Feel dizzy.  Have swelling in your ankles.  Have trouble with your vision. Get help right away if you:  Develop a severe headache or confusion.  Have unusual weakness or numbness.  Feel faint.  Have severe pain in your chest or abdomen.  Vomit repeatedly.  Have trouble breathing. Summary  Hypertension is when the force of blood pumping through your arteries is too strong. If this condition is not controlled, it may put you at risk for serious complications.  Your personal target blood pressure may vary depending on your medical conditions, your age, and other factors. For most people, a normal blood pressure is less than 120/80.  Hypertension is treated with lifestyle changes, medicines, or a combination of both. Lifestyle changes include losing weight, eating a healthy, low-sodium diet,  exercising more, and limiting alcohol. This information is not intended to replace advice given to you by your health care provider. Make sure you discuss any questions you have with your health care provider. Document Revised: 09/25/2017 Document Reviewed: 09/25/2017 Elsevier Patient Education  [place] Maintenance, Female Adopting a healthy lifestyle and getting preventive care are important in promoting health and wellness. Ask your health care provider about:  The right schedule for you to have regular tests and exams.  Things you can do on your own to prevent diseases and keep yourself healthy. What should I know about diet, weight, and exercise? Eat a healthy diet  Eat a diet that includes plenty of vegetables, fruits, low-fat dairy products, and lean protein.  Do not eat a lot of foods that are high in solid fats, added sugars, or sodium.   Maintain a healthy weight Body mass index (BMI) is used to identify weight problems. It estimates body fat based on height and weight. Your health care provider can help determine your BMI and help you achieve or maintain a healthy weight. Get regular exercise Get regular exercise. This is one of the most important things you can do for your health. Most adults should:  Exercise for at least 150 minutes each week. The exercise should increase your heart rate and make you sweat (moderate-intensity exercise).  Do strengthening exercises at least twice a week. This is in addition to the moderate-intensity exercise.  Spend less time sitting. Even light physical activity can be beneficial. Watch cholesterol and blood lipids Have your blood tested for lipids and cholesterol at 49 years of age, then have this test every 5 years. Have your cholesterol levels checked more often if:  Your lipid or cholesterol levels are high.  You are older than 49 years of age.  You are at high risk for heart disease. What should I know about  cancer screening? Depending on your health history and family history, you may need to have cancer screening at various ages. This may include screening for:  Breast cancer.  Cervical cancer.  Colorectal cancer.  Skin cancer.  Lung cancer. What should I know about heart disease, diabetes, and high blood pressure? Blood pressure and heart disease  High blood pressure causes heart disease and increases the risk of stroke. This is more likely to develop in people who have high blood pressure readings, are of African descent, or are overweight.  Have your blood pressure checked: ? Every 3-5 years if you are 45-18 years of age. ? Every year if you are 54 years old or older. Diabetes Have regular  diabetes screenings. This checks your fasting blood sugar level. Have the screening done:  Once every three years after age 35 if you are at a normal weight and have a low risk for diabetes.  More often and at a younger age if you are overweight or have a high risk for diabetes. What should I know about preventing infection? Hepatitis B If you have a higher risk for hepatitis B, you should be screened for this virus. Talk with your health care provider to find out if you are at risk for hepatitis B infection. Hepatitis C Testing is recommended for:  Everyone born from [birth year] through 1964.  Anyone with known risk factors for hepatitis C. Sexually transmitted infections (STIs)  Get screened for STIs, including gonorrhea and chlamydia, if: ? You are sexually active and are younger than 48 years of age. ? You are older than 49 years of age and your health care provider tells you that you are at risk for this type of infection. ? Your sexual activity has changed since you were last screened, and you are at increased risk for chlamydia or gonorrhea. Ask your health care provider if you are at risk.  Ask your health care provider about whether you are at high risk for HIV. Your health care  provider may recommend a prescription medicine to help prevent HIV infection. If you choose to take medicine to prevent HIV, you should first get tested for HIV. You should then be tested every 3 months for as long as you are taking the medicine. Pregnancy  If you are about to stop having your period (premenopausal) and you may become pregnant, seek counseling before you get pregnant.  Take 400 to 800 micrograms (mcg) of folic acid every day if you become pregnant.  Ask for birth control (contraception) if you want to prevent pregnancy. Osteoporosis and menopause Osteoporosis is a disease in which the bones lose minerals and strength with aging. This can result in bone fractures. If you are 64 years old or older, or if you are at risk for osteoporosis and fractures, ask your health care provider if you should:  Be screened for bone loss.  Take a calcium or vitamin D supplement to lower your risk of fractures.  Be given hormone replacement therapy (HRT) to treat symptoms of menopause. Follow these instructions at home: Lifestyle  Do not use any products that contain nicotine or tobacco, such as cigarettes, e-cigarettes, and chewing tobacco. If you need help quitting, ask your health care provider.  Do not use street drugs.  Do not share needles.  Ask your health care provider for help if you need support or information about quitting drugs. Alcohol use  Do not drink alcohol if: ? Your health care provider tells you not to drink. ? You are pregnant, may be pregnant, or are planning to become pregnant.  If you drink alcohol: ? Limit how much you use to 0-1 drink a day. ? Limit intake if you are breastfeeding.  Be aware of how much alcohol is in your drink. In the [place] one drink equals one 12 oz bottle of beer (355 mL), one 5 oz glass of wine (148 mL), or one 1 oz glass of hard liquor (44 mL). General instructions  Schedule regular health, dental, and eye exams.  Stay current  with your vaccines.  Tell your health care provider if: ? You often feel depressed. ? You have ever been abused or do not feel safe at home. Summary  Adopting a healthy lifestyle and getting preventive care are important in promoting health and wellness.  Follow your health care provider's instructions about healthy diet, exercising, and getting tested or screened for diseases.  Follow your health care provider's instructions on monitoring your cholesterol and blood pressure. This information is not intended to replace advice given to you by your health care provider. Make sure you discuss any questions you have with your health care provider. Document Revised: 01/08/2018 Document Reviewed: 01/08/2018 Elsevier Patient Education  2021 [employer].

## 2020-06-22 NOTE — Assessment & Plan Note (Signed)
Elevated blood pressure readings in the office and at home.  We will increase amlodipine to 10 mg daily. Follow-up in 6 months.

## 2020-06-22 NOTE — Progress Notes (Signed)
Deanna Cook 50 y.o.   Chief Complaint  Patient presents with  . Annual Exam    Pt would like to get  referral for colonoscopy. Pt also needs refill of amlodipine    HISTORY OF PRESENT ILLNESS: This is a 50 y.o. female here for annual exam. Has history of hypertension on amlodipine 5 mg daily.  Blood pressure readings at home elevated. Needs referral for colonoscopy. Eating better and exercising regularly. No other complaints or medical concerns today.  HPI   Prior to Admission medications   Medication Sig Start Date End Date Taking? Authorizing Provider  amLODipine (NORVASC) 5 MG tablet TAKE 1 TABLET BY MOUTH ONCE DAILY. 04/21/20 04/21/21 Yes Caly Pellum, Ines Bloomer, MD  APPLE CIDER VINEGAR PO Take by mouth.   Yes [provider]  Azelastine-Fluticasone 137-50 MCG/ACT SUSP Place 1 spray into the nose 2 (two) times daily. 10/09/18  Yes Padgett, Rae Halsted, MD  cholecalciferol (VITAMIN D3) 25 MCG (1000 UNIT) tablet Take 1,000 Units by mouth daily.   Yes [provider]  Cyanocobalamin (B-12) 1000 MCG TABS Take 1 tablet by mouth daily.   Yes [provider]  fexofenadine (ALLEGRA) 180 MG tablet Take 180 mg by mouth daily.   Yes [provider]  fluticasone (FLONASE) 50 MCG/ACT nasal spray Place into both nostrils daily.   Yes [provider]  Clindamycin-Benzoyl Per, Refr, gel APPLY TO FACE EVERY MORNING 07/06/19   [provider]  cyclobenzaprine (FLEXERIL) 10 MG tablet Take 1 tablet (10 mg total) by mouth at bedtime. 06/24/19   Horald Pollen, MD  meloxicam (MOBIC) 15 MG tablet Take 1 tablet (15 mg total) by mouth daily. 06/24/19   Horald Pollen, MD  tretinoin (RETIN-A) 0.025 % cream Apply to face nightly to tolerance 06/04/19   [provider]    No Known Allergies  Patient Active Problem List   Diagnosis Date Noted  . Essential hypertension 02/11/2019  . Refusal of blood transfusions as patient is  [employer] 08/07/2018  . Seasonal allergies 08/07/2018    Past Medical History:  Diagnosis Date  . Allergic rhinitis   . Fibroid   . Hypertension     Past Surgical History:  Procedure Laterality Date  . ABDOMINAL HYSTERECTOMY     [facility]-2014  . APPENDECTOMY  1989  . EYE SURGERY     Hole in retina    Social History   Socioeconomic History  . Marital status: Married    Spouse name: Not on file  . Number of children: Not on file  . Years of education: Not on file  . Highest education level: Not on file  Occupational History  . Not on file  Tobacco Use  . Smoking status: Never Smoker  . Smokeless tobacco: Never Used  Vaping Use  . Vaping Use: Never used  Substance and Sexual Activity  . Alcohol use: Yes    Comment: social  . Drug use: Never  . Sexual activity: Yes    Birth control/protection: Surgical    Comment: 1st intercourse 50 yo-More than 5 partners  Other Topics Concern  . Not on file  Social History Narrative  . Not on file   Social Determinants of Health   Financial Resource Strain: Not on file  Food Insecurity: Not on file  Transportation Needs: Not on file  Physical Activity: Not on file  Stress: Not on file  Social Connections: Not on file  Intimate Partner Violence: Not on file    Family  History  Problem Relation Age of Onset  . Hypertension Mother   . Colon cancer Mother   . Heart attack Father   . Diabetes Mellitus I Father   . Kidney failure Father   . Thyroid cancer Sister   . Kidney disease Brother      Review of Systems  Constitutional: Negative.  Negative for chills and fever.  HENT: Negative.  Negative for congestion and sore throat.   Respiratory: Negative.  Negative for cough and shortness of breath.   Cardiovascular: Negative.  Negative for chest pain and palpitations.  Gastrointestinal: Negative for abdominal pain, diarrhea, nausea and vomiting.  Genitourinary: Negative.  Negative for dysuria and hematuria.   Musculoskeletal: Negative.  Negative for back pain, myalgias and neck pain.  Skin: Negative.  Negative for rash.  Neurological: Negative.  Negative for dizziness and headaches.  All other systems reviewed and are negative.   Today's Vitals   06/22/20 1313  BP: 140/86  Pulse: 86  Temp: 98.5 F (36.9 C)  TempSrc: Oral  SpO2: 98%  Weight: 220 lb (99.8 kg)  Height: 5\' 9"  (1.753 m)   Body mass index is 32.49 kg/m. Wt Readings from Last 3 Encounters:  06/22/20 220 lb (99.8 kg)  09/16/19 227 lb (103 kg)  07/22/19 219 lb (99.3 kg)    Physical Exam Vitals reviewed.  Constitutional:      Appearance: Normal appearance.  HENT:     Head: Normocephalic.  Eyes:     Extraocular Movements: Extraocular movements intact.     Conjunctiva/sclera: Conjunctivae normal.     Pupils: Pupils are equal, round, and reactive to light.  Neck:     Vascular: No carotid bruit.  Cardiovascular:     Rate and Rhythm: Normal rate and regular rhythm.     Pulses: Normal pulses.     Heart sounds: Normal heart sounds.  Pulmonary:     Effort: Pulmonary effort is normal.     Breath sounds: Normal breath sounds.  Abdominal:     Palpations: Abdomen is soft.     Tenderness: There is no abdominal tenderness.  Musculoskeletal:        General: Normal range of motion.     Cervical back: Normal range of motion and neck supple. No tenderness.  Lymphadenopathy:     Cervical: No cervical adenopathy.  Skin:    General: Skin is warm and dry.     Capillary Refill: Capillary refill takes less than 2 seconds.  Neurological:     General: No focal deficit present.     Mental Status: She is alert and oriented to person, place, and time.  Psychiatric:        Mood and Affect: Mood normal.        Behavior: Behavior normal.      ASSESSMENT & PLAN: Deanna Cook was seen today for annual exam.  Diagnoses and all orders for this visit:  Encounter for general adult medical examination with abnormal findings  Essential  hypertension -     amLODipine (NORVASC) 10 MG tablet; Take 1 tablet (10 mg total) by mouth daily. -     Comprehensive metabolic panel  Colon cancer screening -     Ambulatory referral to Gastroenterology  Screening for deficiency anemia -     CBC with Differential/Platelet  Screening for lipoid disorders -     Lipid panel  Screening for endocrine, metabolic and immunity disorder -     Hemoglobin A1c -     TSH   Essential  hypertension Elevated blood pressure readings in the office and at home.  We will increase amlodipine to 10 mg daily. Follow-up in 6 months.  Modifiable risk factors discussed with patient. Anticipatory guidance according to age provided. The following topics were discussed: Smoking Diet and nutrition Benefits of exercise Cancer screening and need for colonoscopy and or mammogram as needed Vaccinations Cardiovascular risk assessment Mental health including depression and anxiety Fall and accident prevention   Patient Instructions   Hypertension, Adult High blood pressure (hypertension) is when the force of blood pumping through the arteries is too strong. The arteries are the blood vessels that carry blood from the heart throughout the body. Hypertension forces the heart to work harder to pump blood and may cause arteries to become narrow or stiff. Untreated or uncontrolled hypertension can cause a heart attack, heart failure, a stroke, kidney disease, and other problems. A blood pressure reading consists of a higher number over a lower number. Ideally, your blood pressure should be below 120/80. The first ("top") number is called the systolic pressure. It is a measure of the pressure in your arteries as your heart beats. The second ("bottom") number is called the diastolic pressure. It is a measure of the pressure in your arteries as the heart relaxes. What are the causes? The exact cause of this condition is not known. There are some conditions that result  in or are related to high blood pressure. What increases the risk? Some risk factors for high blood pressure are under your control. The following factors may make you more likely to develop this condition:  Smoking.  Having type 2 diabetes mellitus, high cholesterol, or both.  Not getting enough exercise or physical activity.  Being overweight.  Having too much fat, sugar, calories, or salt (sodium) in your diet.  Drinking too much alcohol. Some risk factors for high blood pressure may be difficult or impossible to change. Some of these factors include:  Having chronic kidney disease.  Having a family history of high blood pressure.  Age. Risk increases with age.  Race. You may be at higher risk if you are African American.  Gender. Men are at higher risk than women before age 73. After age 22, women are at higher risk than men.  Having obstructive sleep apnea.  Stress. What are the signs or symptoms? High blood pressure may not cause symptoms. Very high blood pressure (hypertensive crisis) may cause:  Headache.  Anxiety.  Shortness of breath.  Nosebleed.  Nausea and vomiting.  Vision changes.  Severe chest pain.  Seizures. How is this diagnosed? This condition is diagnosed by measuring your blood pressure while you are seated, with your arm resting on a flat surface, your legs uncrossed, and your feet flat on the floor. The cuff of the blood pressure monitor will be placed directly against the skin of your upper arm at the level of your heart. It should be measured at least twice using the same arm. Certain conditions can cause a difference in blood pressure between your right and left arms. Certain factors can cause blood pressure readings to be lower or higher than normal for a short period of time:  When your blood pressure is higher when you are in a health care provider's office than when you are at home, this is called white coat hypertension. Most people  with this condition do not need medicines.  When your blood pressure is higher at home than when you are in a health care provider's  office, this is called masked hypertension. Most people with this condition may need medicines to control blood pressure. If you have a high blood pressure reading during one visit or you have normal blood pressure with other risk factors, you may be asked to:  Return on a different day to have your blood pressure checked again.  Monitor your blood pressure at home for 1 week or longer. If you are diagnosed with hypertension, you may have other blood or imaging tests to help your health care provider understand your overall risk for other conditions. How is this treated? This condition is treated by making healthy lifestyle changes, such as eating healthy foods, exercising more, and reducing your alcohol intake. Your health care provider may prescribe medicine if lifestyle changes are not enough to get your blood pressure under control, and if:  Your systolic blood pressure is above 130.  Your diastolic blood pressure is above 80. Your personal target blood pressure may vary depending on your medical conditions, your age, and other factors. Follow these instructions at home: Eating and drinking  Eat a diet that is high in fiber and potassium, and low in sodium, added sugar, and fat. An example eating plan is called the DASH (Dietary Approaches to Stop Hypertension) diet. To eat this way: ? Eat plenty of fresh fruits and vegetables. Try to fill one half of your plate at each meal with fruits and vegetables. ? Eat whole grains, such as whole-wheat pasta, brown rice, or whole-grain bread. Fill about one fourth of your plate with whole grains. ? Eat or drink low-fat dairy products, such as skim milk or low-fat yogurt. ? Avoid fatty cuts of meat, processed or cured meats, and poultry with skin. Fill about one fourth of your plate with lean proteins, such as fish,  chicken without skin, beans, eggs, or tofu. ? Avoid pre-made and processed foods. These tend to be higher in sodium, added sugar, and fat.  Reduce your daily sodium intake. Most people with hypertension should eat less than 1,500 mg of sodium a day.  Do not drink alcohol if: ? Your health care provider tells you not to drink. ? You are pregnant, may be pregnant, or are planning to become pregnant.  If you drink alcohol: ? Limit how much you use to:  0-1 drink a day for women.  0-2 drinks a day for men. ? Be aware of how much alcohol is in your drink. In the [place] one drink equals one 12 oz bottle of beer (355 mL), one 5 oz glass of wine (148 mL), or one 1 oz glass of hard liquor (44 mL).   Lifestyle  Work with your health care provider to maintain a healthy body weight or to lose weight. Ask what an ideal weight is for you.  Get at least 30 minutes of exercise most days of the week. Activities may include walking, swimming, or biking.  Include exercise to strengthen your muscles (resistance exercise), such as Pilates or lifting weights, as part of your weekly exercise routine. Try to do these types of exercises for 30 minutes at least 3 days a week.  Do not use any products that contain nicotine or tobacco, such as cigarettes, e-cigarettes, and chewing tobacco. If you need help quitting, ask your health care provider.  Monitor your blood pressure at home as told by your health care provider.  Keep all follow-up visits as told by your health care provider. This is important.   Medicines  Take over-the-counter  and prescription medicines only as told by your health care provider. Follow directions carefully. Blood pressure medicines must be taken as prescribed.  Do not skip doses of blood pressure medicine. Doing this puts you at risk for problems and can make the medicine less effective.  Ask your health care provider about side effects or reactions to medicines that you should  watch for. Contact a health care provider if you:  Think you are having a reaction to a medicine you are taking.  Have headaches that keep coming back (recurring).  Feel dizzy.  Have swelling in your ankles.  Have trouble with your vision. Get help right away if you:  Develop a severe headache or confusion.  Have unusual weakness or numbness.  Feel faint.  Have severe pain in your chest or abdomen.  Vomit repeatedly.  Have trouble breathing. Summary  Hypertension is when the force of blood pumping through your arteries is too strong. If this condition is not controlled, it may put you at risk for serious complications.  Your personal target blood pressure may vary depending on your medical conditions, your age, and other factors. For most people, a normal blood pressure is less than 120/80.  Hypertension is treated with lifestyle changes, medicines, or a combination of both. Lifestyle changes include losing weight, eating a healthy, low-sodium diet, exercising more, and limiting alcohol. This information is not intended to replace advice given to you by your health care provider. Make sure you discuss any questions you have with your health care provider. Document Revised: 09/25/2017 Document Reviewed: 09/25/2017 Elsevier Patient Education  [place] Maintenance, Female Adopting a healthy lifestyle and getting preventive care are important in promoting health and wellness. Ask your health care provider about:  The right schedule for you to have regular tests and exams.  Things you can do on your own to prevent diseases and keep yourself healthy. What should I know about diet, weight, and exercise? Eat a healthy diet  Eat a diet that includes plenty of vegetables, fruits, low-fat dairy products, and lean protein.  Do not eat a lot of foods that are high in solid fats, added sugars, or sodium.   Maintain a healthy weight Body mass index (BMI) is used  to identify weight problems. It estimates body fat based on height and weight. Your health care provider can help determine your BMI and help you achieve or maintain a healthy weight. Get regular exercise Get regular exercise. This is one of the most important things you can do for your health. Most adults should:  Exercise for at least 150 minutes each week. The exercise should increase your heart rate and make you sweat (moderate-intensity exercise).  Do strengthening exercises at least twice a week. This is in addition to the moderate-intensity exercise.  Spend less time sitting. Even light physical activity can be beneficial. Watch cholesterol and blood lipids Have your blood tested for lipids and cholesterol at 49 years of age, then have this test every 5 years. Have your cholesterol levels checked more often if:  Your lipid or cholesterol levels are high.  You are older than 49 years of age.  You are at high risk for heart disease. What should I know about cancer screening? Depending on your health history and family history, you may need to have cancer screening at various ages. This may include screening for:  Breast cancer.  Cervical cancer.  Colorectal cancer.  Skin cancer.  Lung cancer. What should  I know about heart disease, diabetes, and high blood pressure? Blood pressure and heart disease  High blood pressure causes heart disease and increases the risk of stroke. This is more likely to develop in people who have high blood pressure readings, are of African descent, or are overweight.  Have your blood pressure checked: ? Every 3-5 years if you are 16-45 years of age. ? Every year if you are 17 years old or older. Diabetes Have regular diabetes screenings. This checks your fasting blood sugar level. Have the screening done:  Once every three years after age 56 if you are at a normal weight and have a low risk for diabetes.  More often and at a younger age if  you are overweight or have a high risk for diabetes. What should I know about preventing infection? Hepatitis B If you have a higher risk for hepatitis B, you should be screened for this virus. Talk with your health care provider to find out if you are at risk for hepatitis B infection. Hepatitis C Testing is recommended for:  Everyone born from [birth year] through 1964.  Anyone with known risk factors for hepatitis C. Sexually transmitted infections (STIs)  Get screened for STIs, including gonorrhea and chlamydia, if: ? You are sexually active and are younger than 49 years of age. ? You are older than 49 years of age and your health care provider tells you that you are at risk for this type of infection. ? Your sexual activity has changed since you were last screened, and you are at increased risk for chlamydia or gonorrhea. Ask your health care provider if you are at risk.  Ask your health care provider about whether you are at high risk for HIV. Your health care provider may recommend a prescription medicine to help prevent HIV infection. If you choose to take medicine to prevent HIV, you should first get tested for HIV. You should then be tested every 3 months for as long as you are taking the medicine. Pregnancy  If you are about to stop having your period (premenopausal) and you may become pregnant, seek counseling before you get pregnant.  Take 400 to 800 micrograms (mcg) of folic acid every day if you become pregnant.  Ask for birth control (contraception) if you want to prevent pregnancy. Osteoporosis and menopause Osteoporosis is a disease in which the bones lose minerals and strength with aging. This can result in bone fractures. If you are 78 years old or older, or if you are at risk for osteoporosis and fractures, ask your health care provider if you should:  Be screened for bone loss.  Take a calcium or vitamin D supplement to lower your risk of fractures.  Be given hormone  replacement therapy (HRT) to treat symptoms of menopause. Follow these instructions at home: Lifestyle  Do not use any products that contain nicotine or tobacco, such as cigarettes, e-cigarettes, and chewing tobacco. If you need help quitting, ask your health care provider.  Do not use street drugs.  Do not share needles.  Ask your health care provider for help if you need support or information about quitting drugs. Alcohol use  Do not drink alcohol if: ? Your health care provider tells you not to drink. ? You are pregnant, may be pregnant, or are planning to become pregnant.  If you drink alcohol: ? Limit how much you use to 0-1 drink a day. ? Limit intake if you are breastfeeding.  Be aware of how  much alcohol is in your drink. In the [place] one drink equals one 12 oz bottle of beer (355 mL), one 5 oz glass of wine (148 mL), or one 1 oz glass of hard liquor (44 mL). General instructions  Schedule regular health, dental, and eye exams.  Stay current with your vaccines.  Tell your health care provider if: ? You often feel depressed. ? You have ever been abused or do not feel safe at home. Summary  Adopting a healthy lifestyle and getting preventive care are important in promoting health and wellness.  Follow your health care provider's instructions about healthy diet, exercising, and getting tested or screened for diseases.  Follow your health care provider's instructions on monitoring your cholesterol and blood pressure. This information is not intended to replace advice given to you by your health care provider. Make sure you discuss any questions you have with your health care provider. Document Revised: 01/08/2018 Document Reviewed: 01/08/2018 Elsevier Patient Education  2021 [place], MD [facility]

## 2020-06-30 ENCOUNTER — Other Ambulatory Visit (HOSPITAL_COMMUNITY): Payer: Self-pay

## 2020-06-30 MED ORDER — METHYLPREDNISOLONE 4 MG PO TBPK
ORAL_TABLET | ORAL | 0 refills | Status: DC
  Filled 2020-06-30: qty 21, 6d supply, fill #0

## 2020-07-22 ENCOUNTER — Ambulatory Visit: Payer: No Typology Code available for payment source | Admitting: Obstetrics and Gynecology

## 2020-09-01 ENCOUNTER — Encounter: Payer: Self-pay | Admitting: Emergency Medicine

## 2020-09-01 ENCOUNTER — Ambulatory Visit (INDEPENDENT_AMBULATORY_CARE_PROVIDER_SITE_OTHER): Payer: No Typology Code available for payment source | Admitting: Emergency Medicine

## 2020-09-01 ENCOUNTER — Other Ambulatory Visit: Payer: Self-pay

## 2020-09-01 VITALS — BP 130/80 | HR 85 | Temp 98.1°F | Ht 69.0 in | Wt 218.0 lb

## 2020-09-01 DIAGNOSIS — M238X1 Other internal derangements of right knee: Secondary | ICD-10-CM | POA: Diagnosis not present

## 2020-09-01 DIAGNOSIS — M25561 Pain in right knee: Secondary | ICD-10-CM

## 2020-09-01 DIAGNOSIS — M25252 Flail joint, left hip: Secondary | ICD-10-CM | POA: Diagnosis not present

## 2020-09-01 DIAGNOSIS — M25552 Pain in left hip: Secondary | ICD-10-CM | POA: Diagnosis not present

## 2020-09-01 DIAGNOSIS — G8929 Other chronic pain: Secondary | ICD-10-CM | POA: Diagnosis not present

## 2020-09-01 LAB — COMPREHENSIVE METABOLIC PANEL
ALT: 20 U/L (ref 0–35)
AST: 21 U/L (ref 0–37)
Albumin: 4.4 g/dL (ref 3.5–5.2)
Alkaline Phosphatase: 71 U/L (ref 39–117)
BUN: 6 mg/dL (ref 6–23)
CO2: 32 mEq/L (ref 19–32)
Calcium: 9.1 mg/dL (ref 8.4–10.5)
Chloride: 99 mEq/L (ref 96–112)
Creatinine, Ser: 0.89 mg/dL (ref 0.40–1.20)
GFR: 76.4 mL/min (ref >60.00)
Glucose, Bld: 101 mg/dL — ABNORMAL HIGH (ref 70–99)
Potassium: 3.7 mEq/L (ref 3.5–5.1)
Sodium: 136 mEq/L (ref 135–145)
Total Bilirubin: 0.7 mg/dL (ref 0.2–1.2)
Total Protein: 8.2 g/dL (ref 6.0–8.3)

## 2020-09-01 LAB — SEDIMENTATION RATE: Sed Rate: 19 mm/hr (ref 0–20)

## 2020-09-01 LAB — CBC WITH DIFFERENTIAL/PLATELET
Basophils Absolute: 0.1 10*3/uL (ref 0.0–0.1)
Basophils Relative: 0.6 % (ref 0.0–3.0)
Eosinophils Absolute: 0.1 10*3/uL (ref 0.0–0.7)
Eosinophils Relative: 1.3 % (ref 0.0–5.0)
HCT: 39.6 % (ref 36.0–46.0)
Hemoglobin: 13.3 g/dL (ref 12.0–15.0)
Lymphocytes Relative: 31.1 % (ref 12.0–46.0)
Lymphs Abs: 2.8 10*3/uL (ref 0.7–4.0)
MCHC: 33.6 g/dL (ref 30.0–36.0)
MCV: 88.8 fl (ref 78.0–100.0)
Monocytes Absolute: 0.4 10*3/uL (ref 0.1–1.0)
Monocytes Relative: 4.9 % (ref 3.0–12.0)
Neutro Abs: 5.5 10*3/uL (ref 1.4–7.7)
Neutrophils Relative %: 62.1 % (ref 43.0–77.0)
Platelets: 286 10*3/uL (ref 150.0–400.0)
RBC: 4.46 Mil/uL (ref 3.87–5.11)
RDW: 13.2 % (ref 11.5–15.5)
WBC: 8.9 10*3/uL (ref 4.0–10.5)

## 2020-09-01 NOTE — Progress Notes (Signed)
Deanna Cook 50 y.o.   Chief Complaint  Patient presents with   Office Visit    Left hip and bilateral knee pain    HISTORY OF PRESENT ILLNESS: This is a 50 y.o. female complaining of chronic pain and instability of right knee leading to several falls in the past.  It happened first about 5 years ago and since she has had a couple of more episodes where suddenly right leg gave out and she falls down.  Slowly but progressively getting worse.  She has also noticed what she describes as left hip instability with "popping" sensations and increased laxity over the past year also progressively getting worse.  At times in the morning she wakes up with numbness and tingling of the left hip.  Range of motion of left hip has been affected and is definitely abnormal.  Denies direct injuries or any other associated symptoms.  HPI   Prior to Admission medications   Medication Sig Start Date End Date Taking? Authorizing Provider  amLODipine (NORVASC) 10 MG tablet Take 1 tablet (10 mg total) by mouth daily. 06/22/20  Yes Hagop Mccollam, Eilleen Kempf, MD  APPLE CIDER VINEGAR PO Take by mouth.   Yes [provider]  Azelastine-Fluticasone 137-50 MCG/ACT SUSP Place 1 spray into the nose 2 (two) times daily. 10/09/18  Yes Padgett, Pilar Grammes, MD  cholecalciferol (VITAMIN D3) 25 MCG (1000 UNIT) tablet Take 1,000 Units by mouth daily.   Yes [provider]  Cyanocobalamin (B-12) 1000 MCG TABS Take 1 tablet by mouth daily.   Yes [provider]  fexofenadine (ALLEGRA) 180 MG tablet Take 180 mg by mouth daily.   Yes [provider]  fluticasone (FLONASE) 50 MCG/ACT nasal spray Place into both nostrils daily.   Yes [provider]    No Known Allergies  Patient Active Problem List   Diagnosis Date Noted   Essential hypertension 02/11/2019   Refusal of blood transfusions as patient is Jehovah's Witness 08/07/2018   Seasonal allergies 08/07/2018    Past Medical  History:  Diagnosis Date   Allergic rhinitis    Fibroid    Hypertension     Past Surgical History:  Procedure Laterality Date   ABDOMINAL HYSTERECTOMY     [facility]-2014   APPENDECTOMY  1989   EYE SURGERY     Hole in retina    Social History   Socioeconomic History   Marital status: Married    Spouse name: Not on file   Number of children: Not on file   Years of education: Not on file   Highest education level: Not on file  Occupational History   Not on file  Tobacco Use   Smoking status: Never   Smokeless tobacco: Never  Vaping Use   Vaping Use: Never used  Substance and Sexual Activity   Alcohol use: Yes    Comment: social   Drug use: Never   Sexual activity: Yes    Birth control/protection: Surgical    Comment: 1st intercourse 50 yo-More than 5 partners  Other Topics Concern   Not on file  Social History Narrative   Not on file   Social Determinants of Health   Financial Resource Strain: Not on file  Food Insecurity: Not on file  Transportation Needs: Not on file  Physical Activity: Not on file  Stress: Not on file  Social Connections: Not on file  Intimate Partner Violence: Not on file    Family History  Problem Relation Age of Onset  Hypertension Mother    Colon cancer Mother    Heart attack Father    Diabetes Mellitus I Father    Kidney failure Father    Thyroid cancer Sister    Kidney disease Brother      Review of Systems  Constitutional: Negative.  Negative for chills and fever.  HENT: Negative.  Negative for congestion and sore throat.   Respiratory: Negative.  Negative for cough and shortness of breath.   Cardiovascular:  Negative for chest pain and palpitations.  Gastrointestinal:  Negative for abdominal pain, nausea and vomiting.  Genitourinary: Negative.  Negative for dysuria and hematuria.  Musculoskeletal:  Positive for joint pain.  Skin: Negative.  Negative for rash.  Neurological: Negative.  Negative for dizziness and headaches.   All other systems reviewed and are negative.   Physical Exam Vitals reviewed.  Constitutional:      Appearance: Normal appearance.  HENT:     Head: Normocephalic.  Eyes:     Extraocular Movements: Extraocular movements intact.     Pupils: Pupils are equal, round, and reactive to light.  Cardiovascular:     Rate and Rhythm: Normal rate.  Pulmonary:     Effort: Pulmonary effort is normal.  Abdominal:     Palpations: Abdomen is soft.     Tenderness: There is no abdominal tenderness.  Musculoskeletal:     Cervical back: Normal range of motion.     Comments: Right knee: No swelling or significant tenderness.  Stable in flexion and extension.  Full range of motion Left knee: Within normal limits with full range of motion Left hip: Pain during range of motion Lower extremities: Neurovascularly intact with excellent peripheral pulses.  Skin:    General: Skin is warm and dry.     Capillary Refill: Capillary refill takes less than 2 seconds.  Neurological:     General: No focal deficit present.     Mental Status: She is alert and oriented to person, place, and time.  Psychiatric:        Mood and Affect: Mood normal.        Behavior: Behavior normal.     ASSESSMENT & PLAN: Margel was seen today for office visit.  Diagnoses and all orders for this visit:  Chronic pain of right knee -     CBC with Differential/Platelet -     Comprehensive metabolic panel -     ANA,IFA RA Diag Pnl w/rflx Tit/Patn -     Sedimentation rate -     Ambulatory referral to Orthopedic Surgery  Left hip pain -     CBC with Differential/Platelet -     Comprehensive metabolic panel -     ANA,IFA RA Diag Pnl w/rflx Tit/Patn -     Sedimentation rate -     Ambulatory referral to Orthopedic Surgery  Hip ligament laxity, left  Joint laxity of right knee  Patient Instructions  Chronic Knee Pain, Adult Knee pain that lasts longer than 3 months is called chronic knee pain. You may have pain in one or  both knees. Symptoms of chronic knee pain may also include swelling and stiffness. The most common cause is age-related wear and tear (osteoarthritis) of your knee joint. Many conditions can cause chronic knee pain. Treatment depends on the cause. The main treatments are physical therapy and weight loss. It may also be treated with medicines, injections, a knee sleeve or brace, and by using crutches. Rest, ice, pressure (compression), and elevation, also known as RICE therapy,  may also be recommended. Follow these instructions at home: If you have a knee sleeve or brace:  Wear the knee sleeve or brace as told by your doctor. Take it off only as told by your doctor. Loosen it if your toes: Tingle. Become numb. Turn cold and blue. Keep it clean. If the sleeve or brace is not waterproof: Do not let it get wet. Ask your doctor if you may take it off when you take a bath or shower. If not, cover it with a watertight covering.  Managing pain, stiffness, and swelling     If told, put heat on your knee. Do this as often as told by your doctor. Use the heat source that your doctor recommends, such as a moist heat pack or a heating pad. If you have a removable knee sleeve or brace, take it off as told by your doctor. Place a towel between your skin and the heat source. Leave the heat on for 20-30 minutes. Take off the heat if your skin turns bright red. This is very important. If you cannot feel pain, heat, or cold, you have a greater risk of getting burned. If told, put ice on your knee. To do this: If you have a removable knee sleeve or brace, take it off as told by your doctor. Put ice in a plastic bag. Place a towel between your skin and the bag. Leave the ice on for 20 minutes, 2-3 times a day. Take off the ice if your skin turns bright red. This is very important. If you cannot feel pain, heat, or cold, you have a greater risk of damage to the area. Move your toes often. Raise the injured  area above the level of your heart while you are sitting or lying down. Activity Avoid activities where both feet leave the ground at the same time (high-impact activities). Examples are running, jumping rope, and doing jumping jacks. Follow the exercise plan that your doctor makes for you. Your doctor may suggest that you: Avoid activities that make knee pain worse. You may need to change the exercises that you do, the sports that you participate in, or your job duties. Wear shoes with cushioned soles. Avoid sports that require running and sudden changes in direction. Do exercises or physical therapy. This is planned to match your needs and your abilities. Do exercises that increase your balance and strength, such as tai chi and yoga. Do not use your injured knee to support your body weight until your doctor says that you can. Use crutches as told by your doctor. Return to your normal activities when your doctor says that it is safe. General instructions Take over-the-counter and prescription medicines only as told by your doctor. If you are overweight, work with your doctor and a food expert (dietitian) to set goals to lose weight. Being overweight can make your knee hurt more. Do not smoke or use any products that contain nicotine or tobacco. If you need help quitting, ask your doctor. Keep all follow-up visits. Contact a doctor if: You have knee pain that is not getting better or gets worse. You are not able to do your exercises due to knee pain. Get help right away if: Your knee swells and the swelling gets worse. You cannot move your knee. You have very bad knee pain. Summary Knee pain that lasts more than 3 months is called chronic knee pain. The main treatments for chronic knee pain are physical therapy and weight loss. You may  also need to take medicines, wear a knee sleeve or brace, use crutches, and put ice or heat on your knee. Lose weight if you are overweight. Work with your  doctor and a food expert (dietitian) to help you set goals to lose weight. Being overweight can make your knee hurt more. Follow the exercise plan that your doctor makes for you. This information is not intended to replace advice given to you by your health care provider. Make sure you discuss any questions you have with your healthcare provider. Document Revised: 07/01/2019 Document Reviewed: 07/01/2019 Elsevier Patient Education  2022 [place], [place] [facility]

## 2020-09-01 NOTE — Patient Instructions (Signed)
Chronic Knee Pain, Adult ?Knee pain that lasts longer than 3 months is called chronic knee pain. You may have pain in one or both knees. Symptoms of chronic knee pain may also include swelling and stiffness. The most common cause is age-related wear and tear (osteoarthritis) of your knee joint. Many conditions can cause chronic knee pain. ?Treatment depends on the cause. The main treatments are physical therapy and weight loss. It may also be treated with medicines, injections, a knee sleeve or brace, and by using crutches. Rest, ice, pressure (compression), and elevation, also known as RICE therapy, may also be recommended. ?Follow these instructions at home: ?If you have a knee sleeve or brace: ? ?Wear the knee sleeve or brace as told by your doctor. Take it off only as told by your doctor. ?Loosen it if your toes: ?Tingle. ?Become numb. ?Turn cold and blue. ?Keep it clean. ?If the sleeve or brace is not waterproof: ?Do not let it get wet. ?Ask your doctor if you may take it off when you take a bath or shower. If not, cover it with a watertight covering. ?Managing pain, stiffness, and swelling ?  ?If told, put heat on your knee. Do this as often as told by your doctor. Use the heat source that your doctor recommends, such as a moist heat pack or a heating pad. ?If you have a removable knee sleeve or brace, take it off as told by your doctor. ?Place a towel between your skin and the heat source. ?Leave the heat on for 20-30 minutes. ?Take off the heat if your skin turns bright red. This is very important. If you cannot feel pain, heat, or cold, you have a greater risk of getting burned. ?If told, put ice on your knee. To do this: ?If you have a removable knee sleeve or brace, take it off as told by your doctor. ?Put ice in a plastic bag. ?Place a towel between your skin and the bag. ?Leave the ice on for 20 minutes, 2-3 times a day. ?Take off the ice if your skin turns bright red. This is very important. If you  cannot feel pain, heat, or cold, you have a greater risk of damage to the area. ?Move your toes often. ?Raise the injured area above the level of your heart while you are sitting or lying down. ?Activity ?Avoid activities where both feet leave the ground at the same time (high-impact activities). Examples are running, jumping rope, and doing jumping jacks. ?Follow the exercise plan that your doctor makes for you. Your doctor may suggest that you: ?Avoid activities that make knee pain worse. You may need to change the exercises that you do, the sports that you participate in, or your job duties. ?Wear shoes with cushioned soles. ?Avoid sports that require running and sudden changes in direction. ?Do exercises or physical therapy. This is planned to match your needs and your abilities. ?Do exercises that increase your balance and strength, such as tai chi and yoga. ?Do not use your injured knee to support your body weight until your doctor says that you can. Use crutches as told by your doctor. ?Return to your normal activities when your doctor says that it is safe. ?General instructions ?Take over-the-counter and prescription medicines only as told by your doctor. ?If you are overweight, work with your doctor and a food expert (dietitian) to set goals to lose weight. Being overweight can make your knee hurt more. ?Do not smoke or use any products that   contain nicotine or tobacco. If you need help quitting, ask your doctor. ?Keep all follow-up visits. ?Contact a doctor if: ?You have knee pain that is not getting better or gets worse. ?You are not able to do your exercises due to knee pain. ?Get help right away if: ?Your knee swells and the swelling gets worse. ?You cannot move your knee. ?You have very bad knee pain. ?Summary ?Knee pain that lasts more than 3 months is called chronic knee pain. ?The main treatments for chronic knee pain are physical therapy and weight loss. You may also need to take medicines, wear a  knee sleeve or brace, use crutches, and put ice or heat on your knee. ?Lose weight if you are overweight. Work with your doctor and a food expert (dietitian) to help you set goals to lose weight. Being overweight can make your knee hurt more. ?Follow the exercise plan that your doctor makes for you. ?This information is not intended to replace advice given to you by your health care provider. Make sure you discuss any questions you have with your health care provider. ?Document Revised: 07/01/2019 Document Reviewed: 07/01/2019 ?Elsevier Patient Education ? 2022 Elsevier Inc[employer] ? ?

## 2020-09-03 ENCOUNTER — Encounter: Payer: Self-pay | Admitting: Emergency Medicine

## 2020-09-05 NOTE — Telephone Encounter (Signed)
She can reach out to any orthopedic doctor she wants.  If they need a referral they will let her and our office know.  Thanks.

## 2020-09-06 LAB — ANA,IFA RA DIAG PNL W/RFLX TIT/PATN
Anti Nuclear Antibody (ANA): POSITIVE — AB
Cyclic Citrullin Peptide Ab: <16 UNITS
Rheumatoid fact SerPl-aCnc: <14 IU/mL (ref <14)

## 2020-09-06 LAB — ANTI-NUCLEAR AB-TITER (ANA TITER): ANA Titer 1: 1:40 {titer} — ABNORMAL HIGH

## 2020-09-07 ENCOUNTER — Other Ambulatory Visit: Payer: Self-pay | Admitting: Emergency Medicine

## 2020-09-07 DIAGNOSIS — R768 Other specified abnormal immunological findings in serum: Secondary | ICD-10-CM

## 2020-09-21 ENCOUNTER — Other Ambulatory Visit (HOSPITAL_COMMUNITY): Payer: Self-pay

## 2020-09-21 ENCOUNTER — Ambulatory Visit (INDEPENDENT_AMBULATORY_CARE_PROVIDER_SITE_OTHER): Payer: No Typology Code available for payment source

## 2020-09-21 ENCOUNTER — Ambulatory Visit: Payer: Self-pay

## 2020-09-21 ENCOUNTER — Other Ambulatory Visit: Payer: Self-pay

## 2020-09-21 ENCOUNTER — Ambulatory Visit (INDEPENDENT_AMBULATORY_CARE_PROVIDER_SITE_OTHER): Payer: No Typology Code available for payment source | Admitting: Orthopaedic Surgery

## 2020-09-21 DIAGNOSIS — G8929 Other chronic pain: Secondary | ICD-10-CM | POA: Diagnosis not present

## 2020-09-21 DIAGNOSIS — M25561 Pain in right knee: Secondary | ICD-10-CM | POA: Diagnosis not present

## 2020-09-21 DIAGNOSIS — M25552 Pain in left hip: Secondary | ICD-10-CM | POA: Diagnosis not present

## 2020-09-21 DIAGNOSIS — M25562 Pain in left knee: Secondary | ICD-10-CM | POA: Diagnosis not present

## 2020-09-21 MED ORDER — BUPIVACAINE HCL 0.25 % IJ SOLN
2.0000 mL | INTRAMUSCULAR | Status: AC | PRN
  Administered 2020-09-21: 2 mL via INTRA_ARTICULAR

## 2020-09-21 MED ORDER — METHYLPREDNISOLONE ACETATE 40 MG/ML IJ SUSP
40.0000 mg | INTRAMUSCULAR | Status: AC | PRN
  Administered 2020-09-21: 40 mg via INTRA_ARTICULAR

## 2020-09-21 MED ORDER — PREDNISONE 5 MG PO TABS
ORAL_TABLET | ORAL | 0 refills | Status: AC
  Filled 2020-09-21: qty 21, 6d supply, fill #0

## 2020-09-21 MED ORDER — LIDOCAINE HCL 1 % IJ SOLN
2.0000 mL | INTRAMUSCULAR | Status: AC | PRN
  Administered 2020-09-21: 2 mL

## 2020-09-21 NOTE — Progress Notes (Signed)
Office Visit Note   Patient: Deanna Cook           Date of Birth: [birth year]           MRN: [id number] Visit Date: 09/21/2020              Requested by: Horald Pollen, [place],   [id number] PCP: Horald Pollen, MD   Assessment & Plan: Visit Diagnoses:  1. Pain in left hip   2. Chronic pain of right knee     Plan: Impression is chronic left hip and right knee pain.  Based on the patient's HPI and clinical findings, believe the left hip pain is actually coming from nerve irritation from the back, I would like to start her on a short course of steroids followed by physical therapy.  She is agreeable to this plan.  In regards to the right knee, I believe her symptoms could be coming from a questionable medial meniscus tear, we have discussed proceeding with cortisone injection today for which she would like to proceed.  Should her symptoms not improve over the next several weeks to months, she will let us know.  Follow-up with [place] as needed.  Follow-Up Instructions: Return if symptoms worsen or fail to improve.   Orders:  Orders Placed This Encounter  Procedures   Large Joint Inj: R knee   XR KNEE 3 VIEW RIGHT   XR HIP UNILAT W OR W/O PELVIS 2-3 VIEWS LEFT   XR Lumbar Spine 2-3 Views   Ambulatory referral to Physical Therapy   Meds ordered this encounter  Medications   predniSONE (DELTASONE) 5 MG tablet    Sig: Take 6 tablets (30 mg total) by mouth daily for 1 day, THEN 5 tablets (25 mg total) daily for 1 day, THEN 4 tablets (20 mg total) daily for 1 day, THEN 3 tablets (15 mg total) daily for 1 day, THEN 2 tablets (10 mg total) daily for 1 day, THEN 1 tablet (5 mg total) daily for 1 day.    Dispense:  21 tablet    Refill:  0       Procedures: Large Joint Inj: R knee on 09/21/2020 9:22 AM Indications: pain Details: 22 G needle, anterolateral approach Medications: 2 mL lidocaine 1 %; 2 mL bupivacaine 0.25 %; 40 mg methylPREDNISolone  acetate 40 MG/ML     Clinical Data: No additional findings.   Subjective: Chief Complaint  Patient presents with   Right Knee - Pain   Left Hip - Pain    HPI patient is a pleasant 50 year old female who comes in today with concerns about her left hip and right knee.  About 5 years ago she notes that her right knee gave out causing her to fall out of her shower.  She has had a few falls since which she thinks were caused by her right knee giving way.  She never noticed any pain to the knee until recently.  The pain is primarily to the medial aspect and worse with stair climbing or pivoting which she tries not to do.  No previous cortisone injection.  She does not take medication for this as the symptoms are very transient.  In regards to the left hip, she had an episode several years ago which sounds like sciatica to the left lower extremity that started after going from a seated to standing position.  The pain she has had since is primarily deep within the left buttock and  radiates down the back of the leg not quite to the knee.  The symptoms appear to be worse when she is going from a seated to standing position.  She constantly feels as though she has to pop her hip back in place.  She has been noticing numbness to the entire left leg which primarily occurs in the morning when awakening from her sleep.  No bowel or bladder change or saddle paresthesias.  Review of Systems as detailed in HPI.  All others reviewed and are negative.   Objective: Vital Signs: There were no vitals taken for this visit.  Physical Exam well-developed and well-nourished female in no acute distress.  Alert and oriented x3.  Ortho Exam examination of the left hip reveals a negative logroll and negative FADIR.  No tenderness to the greater trochanter.  She does have a moderately positive straight leg raise.  No focal weakness.  Right lower extremity shows a negative logroll and negative FADIR.  Negative straight leg  raise.  Range of motion of the knee from 0 to 125 degrees.  No effusion.  Moderate medial joint line tenderness.  Mild patellofemoral crepitus.  Ligaments are stable.  She is neurovascular intact distally.  Specialty Comments:  No specialty comments available.  Imaging: XR HIP UNILAT W OR W/O PELVIS 2-3 VIEWS LEFT  Result Date: 09/21/2020 No acute or structural abnormalities  XR KNEE 3 VIEW RIGHT  Result Date: 09/21/2020 Mild degenerative changes to the medial and patellofemoral compartments  XR Lumbar Spine 2-3 Views  Result Date: 09/21/2020 Mild multilevel degenerative changes    PMFS History: Patient Active Problem List   Diagnosis Date Noted   Essential hypertension 02/11/2019   Refusal of blood transfusions as patient is Jehovah's Witness 08/07/2018   Seasonal allergies 08/07/2018   Past Medical History:  Diagnosis Date   Allergic rhinitis    Fibroid    Hypertension     Family History  Problem Relation Age of Onset   Hypertension Mother    Colon cancer Mother    Heart attack Father    Diabetes Mellitus I Father    Kidney failure Father    Thyroid cancer Sister    Kidney disease Brother     Past Surgical History:  Procedure Laterality Date   ABDOMINAL HYSTERECTOMY     LAVH-2014   APPENDECTOMY  1989   EYE SURGERY     Hole in retina   Social History   Occupational History   Not on file  Tobacco Use   Smoking status: Never   Smokeless tobacco: Never  Vaping Use   Vaping Use: Never used  Substance and Sexual Activity   Alcohol use: Yes    Comment: social   Drug use: Never   Sexual activity: Yes    Birth control/protection: Surgical    Comment: 1st intercourse 50 yo-More than 5 partners

## 2020-10-07 ENCOUNTER — Encounter: Payer: Self-pay | Admitting: Rehabilitative and Restorative Service Providers"

## 2020-10-07 ENCOUNTER — Other Ambulatory Visit: Payer: Self-pay

## 2020-10-07 ENCOUNTER — Ambulatory Visit: Payer: No Typology Code available for payment source | Admitting: Rehabilitative and Restorative Service Providers"

## 2020-10-07 DIAGNOSIS — G8929 Other chronic pain: Secondary | ICD-10-CM

## 2020-10-07 DIAGNOSIS — M6281 Muscle weakness (generalized): Secondary | ICD-10-CM

## 2020-10-07 DIAGNOSIS — M25552 Pain in left hip: Secondary | ICD-10-CM | POA: Diagnosis not present

## 2020-10-07 DIAGNOSIS — R262 Difficulty in walking, not elsewhere classified: Secondary | ICD-10-CM

## 2020-10-07 DIAGNOSIS — M25561 Pain in right knee: Secondary | ICD-10-CM

## 2020-10-07 NOTE — Patient Instructions (Signed)
Access Code: [email or web address] URL: [email or web address] Date: 10/07/2020 Prepared by: Scot Jun  Exercises Clamshell - 2 x daily - 7 x weekly - 3 sets - 10 reps Supine Figure 4 Piriformis Stretch (Mirrored) - 2 x daily - 7 x weekly - 1 sets - 5 reps - 30 hold Supine Piriformis Stretch with Foot on Ground - 2 x daily - 7 x weekly - 1 sets - 5 reps - 30 hold Seated Straight Leg Heel Taps - 2 x daily - 7 x weekly - 3 sets - 10 reps Sit to Stand - 2 x daily - 7 x weekly - 3 sets - 10 reps

## 2020-10-07 NOTE — Therapy (Signed)
[facility] [address] [place], [place], [address] Phone: [phone]   Fax:  [phone]  Physical Therapy Evaluation  Patient Details  Name: Deanna Cook MRN: [id number] Date of Birth: 12/18/1970 Referring Provider (PT): Aundra Dubin, [place]   Encounter Date: 10/07/2020   PT End of Session - 10/07/20 0829     Visit Number 1    Number of Visits 20    Date for PT Re-Evaluation 12/16/20    Authorization Type [facility] $40 copay    Progress Note Due on Visit 10    PT Start Time 0845    PT Stop Time 0922    PT Time Calculation (min) 37 min    Activity Tolerance Patient tolerated treatment well    Behavior During Therapy [facility] for tasks assessed/performed             Past Medical History:  Diagnosis Date   Allergic rhinitis    Fibroid    Hypertension     Past Surgical History:  Procedure Laterality Date   ABDOMINAL HYSTERECTOMY     [facility]-2014   APPENDECTOMY  1989   EYE SURGERY     Hole in retina    There were no vitals filed for this visit.    Subjective Assessment - 10/07/20 0826     Subjective Pt. indicated seeing MD for Rt knee and Lt hip with onset of symptoms about 5 years ago after falling out of shower (unsure why).  Pt. stated a bit later she had another fall that was a result of knee giving way.  Pt. stated some more time passed and she was walking again and felt knee give way again.  Pt. stated she moved to local area here and in last year she has felt continued pressure and complaints in Rt knee.  Pt. stated giving way has continued to occur at various times with movements.  Pt. stated in last year with progressing worse feeling of Lt hip complaints.  Pt. stated she felt like it "comes out of socket." but also described it as pain in the hip that has progressed.  Pt. stated 4-5 weeks ago she felt pain continued longer after standing up.  Hitting wall with hip finally helped.  Pt. stated restriction of Lt leg in  intimate positioning.  Pt. indicated hip pain in anterior hip/groin.    Limitations Standing;Walking    Diagnostic tests xrays Lt hip unremarkable. Rt knee mild degenerative changes, lumbar degenerative changes    Patient Stated Goals Reduce pain    Currently in Pain? Yes    Pain Score 8    at worst   Pain Location Hip    Pain Orientation Left    Pain Descriptors / Indicators Sharp    Pain Type Chronic pain    Pain Onset More than a month ago    Pain Frequency Intermittent    Aggravating Factors  sitting prolonged transfers, spreading legs to side/back, standing prolonged    Multiple Pain Sites Yes    Pain Score 8    Pain Location Knee    Pain Orientation Right;Anterior    Pain Descriptors / Indicators Pressure    Pain Type Chronic pain    Pain Onset More than a month ago    Pain Frequency Intermittent    Aggravating Factors  going up stairs, giving way randomly at times    Pain Relieving Factors not doing stairs  [facility] PT Assessment - 10/07/20 0001       Assessment   Medical Diagnosis M25.552 (ICD-10-CM) - Pain in left hip    Referring Provider (PT) Aundra Dubin, PA-C    Onset Date/Surgical Date 10/08/19    Hand Dominance Left      Precautions   Precautions None      Balance Screen   Has the patient fallen in the past 6 months No    Has the patient had a decrease in activity level because of a fear of falling?  No    Is the patient reluctant to leave their home because of a fear of falling?  No      Home Environment   Living Environment Private residence    Additional Comments Full flight of stairs to bedroom      Prior Function   Vocation Requirements no physical lifting/carrying.  Sitting primarily for work, has standing desk at well    Leisure Walking for exercise      Observation/Other Assessments   Focus on Therapeutic Outcomes (FOTO)  intake 56 %, predicted 72%      Functional Tests   Functional tests Sit to Stand;Single leg stance       Sit to Stand   Comments 18 inch chair s UE assist on 1st try      ROM / Strength   AROM / PROM / Strength Strength;PROM;AROM      AROM   AROM Assessment Site Lumbar;Hip;Knee    Right/Left Hip Left;Right    Right Hip External Rotation  60    Left Hip External Rotation  35    Right/Left Knee Left;Right      PROM   Overall PROM Comments measured in supine.  ER/IR measured in 90 deg flexion    PROM Assessment Site Hip    Right/Left Hip Right;Left    Right Hip Flexion 120    Right Hip External Rotation  60    Right Hip Internal Rotation  50    Left Hip Flexion 110    Left Hip External Rotation  40    Left Hip Internal Rotation  50      Strength   Strength Assessment Site Knee;Hip;Ankle    Right/Left Hip Right;Left    Right Hip Flexion 5/5    Right Hip Extension 5/5    Left Hip Flexion 5/5    Left Hip Extension 5/5    Left Hip ABduction 4/5    Right/Left Knee Right;Left    Right Knee Extension 5/5   42.2, 51.2 lb   Left Knee Extension 5/5   47.8, 49.3 lbs   Right/Left Ankle Right;Left    Right Ankle Dorsiflexion 5/5    Left Ankle Dorsiflexion 5/5      Special Tests   Other special tests + Corky Sox on Lt                        Objective measurements completed on examination: See above findings.       [facility] [facility] PT Treatment/Exercise - 10/07/20 0001       Exercises   Exercises Other Exercises;Knee/Hip    Other Exercises  HEP instruction/performance c cues for techniques, handout provided.  Trial set performed of each for comprehension and symptom assessment.      Knee/Hip Exercises: Stretches   Other Knee/Hip Stretches figure 4 supine press away and pull toward 30 sec x 2 Lt      Knee/Hip Exercises:  Seated   Other Seated Knee/Hip Exercises slr x 10 Rt    Sit to Sand without UE support;10 reps   slow eccentric     Knee/Hip Exercises: Sidelying   Clams x 10 Lt      Manual Therapy   Manual therapy comments inferior Lt hip joint mobs G3,  anterior hip joint mobs Lt in prone c ER mobilization c movement g3                     PT Education - 10/07/20 0834     Education Details HEP, POC    Person(s) Educated Patient    Methods Explanation;Demonstration;Verbal cues;Handout    Comprehension Verbalized understanding;Returned demonstration              PT Short Term Goals - 10/07/20 0829       PT SHORT TERM GOAL #1   Title Patient will demonstrate independent use of home exercise program to maintain progress from in clinic treatments.    Time 3    Period Weeks    Status New    Target Date 10/28/20               PT Long Term Goals - 10/07/20 0828       PT LONG TERM GOAL #1   Title Patient will demonstrate/report pain at worst less than or equal to 2/10 to facilitate minimal limitation in daily activity secondary to pain symptoms.    Time 10    Period Weeks    Status New    Target Date 12/16/20      PT LONG TERM GOAL #2   Title Patient will demonstrate independent use of home exercise program to facilitate ability to maintain/progress functional gains from skilled physical therapy services.    Time 10    Period Weeks    Status New    Target Date 12/16/20      PT LONG TERM GOAL #3   Title Pt. will demonstrate FOTO outcome > or = 72% to indicated reduced disability due to condition.    Time 10    Period Weeks    Status New    Target Date 12/16/20      PT LONG TERM GOAL #4   Title Patient will demonstrate bilateral LE MMT 5/5 throughout to facilitate ability to perform usual standing, walking, stairs at PLOF s limitation due to symptoms.    Time 10    Period Weeks    Status New    Target Date 12/16/20      PT LONG TERM GOAL #5   Title Pt. will demonstrate Lt hip AROM/PROM equal to Rt to facilitate usual mobility for daily activity.    Time 10    Period Weeks    Status New    Target Date 12/16/20                    Plan - 10/07/20 0827     Clinical Impression  Statement Patient is a 50 y.o. who comes to clinic with complaints of Lt hip/leg pain, Rt knee pain with mobility, strength and movement coordination deficits that impair their ability to perform usual daily and recreational functional activities without increase difficulty/symptoms at this time.  Patient to benefit from skilled PT services to address impairments and limitations to improve to previous level of function without restriction secondary to condition.    Examination-Activity Limitations Locomotion Level;Transfers;Bend;Squat;Stairs;Stand;Bed Mobility    Examination-Participation Restrictions Interpersonal Relationship;Community Activity;Occupation;Shop;Cleaning  Stability/Clinical Decision Making Stable/Uncomplicated    Clinical Decision Making Low    Rehab Potential Good    PT Frequency Other (comment)   1-2x/week   PT Duration Other (comment)   10 weeks   PT Treatment/Interventions ADLs/Self Care Home Management;Cryotherapy;Electrical Stimulation;Iontophoresis '4mg'$ /ml Dexamethasone;Moist Heat;Balance training;Therapeutic exercise;Therapeutic activities;Functional mobility training;Stair training;Gait training;DME Instruction;Ultrasound;Neuromuscular re-education;Patient/family education;Passive range of motion;Spinal Manipulations;Joint Manipulations;Dry needling;Taping;Manual techniques    PT Next Visit Plan Quad strengthening, manual for hip joint mobility    PT Home Exercise Plan [id number]    Consulted and Agree with Plan of Care Patient             Patient will benefit from skilled therapeutic intervention in order to improve the following deficits and impairments:  Pain, Decreased strength, Decreased activity tolerance, Decreased mobility, Difficulty walking, Decreased balance, Decreased range of motion, Impaired perceived functional ability, Improper body mechanics, Impaired flexibility, Decreased coordination, Hypomobility  Visit Diagnosis: Pain in left hip  Chronic pain of  right knee  Muscle weakness (generalized)  Difficulty in walking, not elsewhere classified     Problem List Patient Active Problem List   Diagnosis Date Noted   Essential hypertension 02/11/2019   Refusal of blood transfusions as patient is Jehovah's Witness 08/07/2018   Seasonal allergies 08/07/2018    Scot Jun, PT, DPT, OCS, ATC 10/07/20  9:33 AM    [facility] Physical Therapy [address] [place], [place], [address] Phone: [phone]   Fax:  [phone]  Name: Deepti Handcock MRN: [id number] Date of Birth: 1970-07-01

## 2020-10-17 ENCOUNTER — Other Ambulatory Visit: Payer: Self-pay

## 2020-10-17 ENCOUNTER — Other Ambulatory Visit (HOSPITAL_COMMUNITY): Payer: Self-pay

## 2020-10-17 ENCOUNTER — Ambulatory Visit (AMBULATORY_SURGERY_CENTER): Payer: No Typology Code available for payment source | Admitting: *Deleted

## 2020-10-17 VITALS — Ht 69.0 in | Wt 218.0 lb

## 2020-10-17 DIAGNOSIS — Z1211 Encounter for screening for malignant neoplasm of colon: Secondary | ICD-10-CM

## 2020-10-17 MED ORDER — NA SULFATE-K SULFATE-MG SULF 17.5-3.13-1.6 GM/177ML PO SOLN
1.0000 | ORAL | 0 refills | Status: DC
  Filled 2020-10-17: qty 354, 1d supply, fill #0

## 2020-10-17 NOTE — Progress Notes (Signed)

## 2020-10-21 ENCOUNTER — Encounter: Payer: Self-pay | Admitting: Gastroenterology

## 2020-10-21 ENCOUNTER — Other Ambulatory Visit (HOSPITAL_COMMUNITY): Payer: Self-pay

## 2020-10-21 ENCOUNTER — Ambulatory Visit (INDEPENDENT_AMBULATORY_CARE_PROVIDER_SITE_OTHER): Payer: No Typology Code available for payment source | Admitting: Internal Medicine

## 2020-10-21 ENCOUNTER — Encounter: Payer: Self-pay | Admitting: Internal Medicine

## 2020-10-21 ENCOUNTER — Other Ambulatory Visit: Payer: Self-pay

## 2020-10-21 DIAGNOSIS — G8929 Other chronic pain: Secondary | ICD-10-CM

## 2020-10-21 DIAGNOSIS — M25552 Pain in left hip: Secondary | ICD-10-CM | POA: Diagnosis not present

## 2020-10-21 DIAGNOSIS — R768 Other specified abnormal immunological findings in serum: Secondary | ICD-10-CM | POA: Diagnosis not present

## 2020-10-21 DIAGNOSIS — M25561 Pain in right knee: Secondary | ICD-10-CM

## 2020-10-21 NOTE — Patient Instructions (Signed)
Patellofemoral Pain Syndrome Patellofemoral pain syndrome is a condition in which the tissue (cartilage) on the underside of the kneecap (patella) softens or breaks down. This causes pain in the front of the knee. The condition is also called runner's knee or chondromalacia patella. Patellofemoral pain syndrome is most common in young adults who are active in sports. The knee is the largest joint in the body. The patella covers the front of the knee and is attached to muscles above and below the knee. The underside of the patella is covered with a smooth type of cartilage (synovium). The smooth surface helps the patella glide easily when you move your knee. Patellofemoral pain syndrome causes swelling in the joint linings and bone surfaces in the knee. What are the causes? This condition may be caused by: Overuse of the knee. Poor alignment of your knee joints. Weak leg muscles. A direct hit to your kneecap. What increases the risk? You are more likely to develop this condition if: You do a lot of activities that can wear down your kneecap. These include: Running. Squatting. Climbing stairs. You start a new physical activity or exercise program. You wear shoes that do not fit well. You do not have good leg strength. You are overweight. What are the signs or symptoms? The main symptom of this condition is knee pain. This may feel like a dull, aching pain underneath your patella, in the front of your knee. There may be a popping or cracking sound when you move your knee. Pain may get worse with: Exercise. Climbing stairs. Running. Jumping. Squatting. Kneeling. Sitting for a long time. Moving or pushing on your patella. How is this diagnosed? This condition may be diagnosed based on: Your symptoms and medical history. You may be asked about your recent physical activities and which ones cause knee pain. A physical exam. This may include: Moving your patella back and forth. Checking  your range of knee motion. Having you squat or jump to see if you have pain. Checking the strength of your leg muscles. Imaging tests to confirm the diagnosis. These may include an MRI of your knee. How is this treated? This condition may be treated at home with rest, ice, compression, and elevation (Deanna Cook).  Other treatments may include: NSAIDs, such as ibuprofen. Physical therapy to stretch and strengthen your leg muscles. Shoe inserts (orthotics) to take stress off your knee. A knee brace or knee support. Adhesive tapes to the skin. Surgery to remove damaged cartilage or move the patella to a better position. This is rare. Follow these instructions at home: If you have a brace: Wear the brace as told by your health care provider. Remove it only as told by your health care provider. Loosen the brace if your toes tingle, become numb, or turn cold and blue. Keep the brace clean. If the brace is not waterproof: Do not let it get wet. Cover it with a watertight covering when you take a bath or a shower. Managing pain, stiffness, and swelling  If directed, put ice on the painful area. To do this: If you have a removable brace, remove it as told by your health care provider. Put ice in a plastic bag. Place a towel between your skin and the bag. Leave the ice on for 20 minutes, 2-3 times a day. Remove the ice if your skin turns bright red. This is very important. If you cannot feel pain, heat, or cold, you have a greater risk of damage to the area. Move   your toes often to reduce stiffness and swelling. Raise (elevate) the injured area above the level of your heart while you are sitting or lying down. Activity Rest your knee. Avoid activities that cause knee pain. Perform stretching and strengthening exercises as told by your health care provider or physical therapist. Return to your normal activities as told by your health care provider. Ask your health care provider what activities are  safe for you. General instructions Take over-the-counter and prescription medicines only as told by your health care provider. Use splints, braces, knee supports, or walking aids as directed by your health care provider. Do not use any products that contain nicotine or tobacco, such as cigarettes, e-cigarettes, and chewing tobacco. These can delay healing. If you need help quitting, ask your health care provider. Keep all follow-up visits. This is important. Contact a health care provider if: Your symptoms get worse. You are not improving with home care. Summary Patellofemoral pain syndrome is a condition in which the tissue (cartilage) on the underside of the kneecap (patella) softens or breaks down. This condition causes swelling in the joint linings and bone surfaces in the knee. This leads to pain in the front of the knee. This condition may be treated at home with rest, ice, compression, and elevation (Deanna Cook). Use splints, braces, knee supports, or walking aids as directed by your health care provider.   Antinuclear Antibody Test Why am I having this test? This is a test that is used to help diagnose systemic lupus erythematosus (SLE) and other autoimmune diseases. An autoimmune disease is a disease in which the body's own defense (immune)system attacks its organs. What is being tested? This test checks for antinuclear antibodies (ANA) in the blood. The presence of ANA is associated with several autoimmune diseases. It is seen in almost all patients with lupus. What kind of sample is taken? A blood sample is required for this test. It is usually collected by inserting a needle into a blood vessel. How are the results reported? Your test results will be reported as either positive or negative. A false-positive result can occur. A false positive is incorrect because it means that a condition is present when it is not. What do the results mean? A positive test result may mean that you  have: Lupus. Other autoimmune diseases, such as rheumatoid arthritis, scleroderma, or Sjgren syndrome. Conditions that may cause a false-positive result include: Liver dysfunction. Myasthenia gravis. Infectious mononucleosis. Talk with your health care provider about what your results mean. Questions to ask your health care provider Ask your health care provider, or the department that is doing the test: When will my results be ready? How will I get my results? What are my treatment options? What other tests do I need? What are my next steps? Summary This is a test that is used to help diagnose systemic lupus erythematosus (SLE) and other autoimmune diseases. An autoimmune disease is a disease in which the body's own defense (immune)system attacks the body. This test checks for antinuclear antibodies (ANA) in the blood. The presence of ANA is associated with several autoimmune diseases. It is seen in almost all patients with lupus. Your test results will be reported as either positive or negative. Talk with your health care provider about what your results mean. This information is not intended to replace advice given to you by your health care provider. Make sure you discuss any questions you have with your health care provider. Document Revised: 09/18/2019 Document Reviewed: 09/18/2019 Elsevier  Elsevier Patient Education  2022 [employer]  

## 2020-10-21 NOTE — Progress Notes (Signed)
Office Visit Note  Patient: Deanna Cook             Date of Birth: [birth year]           MRN: [id number]             PCP: Horald Pollen, MD Referring: Horald Pollen, * Visit Date: 10/21/2020 Occupation: [employer]  Subjective:  New Patient (Initial Visit) (Patient complains of left hip pain with radiation into the left leg with numbness and tingling.)   History of Present Illness: Deanna Cook is a 50 y.o. female here for positive ANA with right knee and left hip pain and decreased mobility.  She has been noticing problems intermittently since moving to this area around 2019.  However about 5 weeks ago she experienced an episode she felt like her left hip was out of position and unable to move properly alleviated with hitting it against the wall.  Since then she has had some more aching and feeling like the hip gets out of position or unable to move normally especially with certain movements requiring a lot of flexion and external rotation.  She also had an episode of 2 hours of pain radiating down the leg after rising from a seated position.  Her right knee is also cause more trouble occasionally seeming to catch or lock part way through its range of movement.  She also describes pain that feels like it is inside the joint when climbing stairs. She recalls a few episodes of suddenly falling due to her joint giving way or she feels it is not engaging before these past few weeks.  She recalls falling while standing in a shower 5 or 6 years ago, fall onto her right knee climbing stairs about 4 years ago, falling forward while bending about 2 years ago. She saw Dr. Erlinda Hong for this with trial of right knee steroid injection and oral prednisone and physical therapy for left hip pain.  She felt not much benefit with the steroid injection after about 3 days.  The oral steroids did not help much for hip pain either. Outside of joint pains she is also had worsened acne during  the past 2 years has seen dermatology for this.  She describes increased fatigue and excessive daytime somnolence.  Otherwise she denies any hair loss, oral ulcers, lymphadenopathy, Raynaud's symptoms, chest pains, or history of blood clots.  Labs reviewed 08/2020 ANA 1:40 homogenous RF neg CCP neg ESR 19 CBC wnl CMP wnl  Imaging reviewed 09/21/20 Xray left hip No acute or structural abnormalities  09/21/20 Xray right knee Mild degenerative changes to the medial and patellofemoral compartments  09/21/20 Xray lumbar spine Mild multilevel degenerative changes  Activities of Daily Living:  Patient reports morning stiffness for 1-30 minutes.   Patient Reports nocturnal pain.  Difficulty dressing/grooming: Reports Difficulty climbing stairs: Denies Difficulty getting out of chair: Denies Difficulty using hands for taps, buttons, cutlery, and/or writing: Denies  Review of Systems  Constitutional:  Positive for fatigue.  HENT:  Negative for mouth sores, mouth dryness and nose dryness.   Eyes:  Positive for dryness. Negative for pain, itching and visual disturbance.  Respiratory:  Negative for cough, hemoptysis, shortness of breath and difficulty breathing.   Cardiovascular:  Negative for chest pain, palpitations and swelling in legs/feet.  Gastrointestinal:  Negative for abdominal pain, blood in stool, constipation and diarrhea.  Endocrine: Negative for increased urination.  Genitourinary:  Negative for painful urination.  Musculoskeletal:  Positive for  joint pain, joint pain and morning stiffness. Negative for joint swelling, myalgias, muscle weakness, muscle tenderness and myalgias.  Skin:  Negative for color change, rash and redness.  Allergic/Immunologic: Negative for susceptible to infections.  Neurological:  Positive for numbness and parasthesias. Negative for dizziness, headaches, memory loss and weakness.  Hematological:  Negative for swollen glands.  Psychiatric/Behavioral:   Positive for sleep disturbance. Negative for confusion.    PMFS History:  Patient Active Problem List   Diagnosis Date Noted   Positive ANA (antinuclear antibody) 10/21/2020   Pain in left hip 10/21/2020   Pain in right knee 10/21/2020   Acne vulgaris 06/04/2019   Hyperpigmentation 06/04/2019   Essential hypertension 02/11/2019   Refusal of blood transfusions as patient is Jehovah's Witness 08/07/2018   Seasonal allergies 08/07/2018    Past Medical History:  Diagnosis Date   Allergic rhinitis    Allergy    Fibroid    Hypertension     Family History  Problem Relation Age of Onset   Colon polyps Mother    Hypertension Mother    Colon cancer Mother 35   Colon polyps Father    Heart attack Father    Diabetes Mellitus I Father    Kidney failure Father    Thyroid cancer Sister    Fibromyalgia Sister    Kidney disease Brother    Esophageal cancer Neg Hx    Stomach cancer Neg Hx    Rectal cancer Neg Hx    Past Surgical History:  Procedure Laterality Date   ABDOMINAL HYSTERECTOMY     LAVH-2014   APPENDECTOMY  1989   COLONOSCOPY  2005   about 20 years ago in [place] exam   [place] in retina   Social History   Social History Narrative   Not on file   Immunization History  Administered Date(s) Administered   Influenza,inj,Quad PF,6+ Mos 12/24/2018   Influenza-Unspecified 11/24/2019   PFIZER(Purple Top)SARS-COV-2 Vaccination 04/05/2019, 04/27/2019   Tdap 01/29/2013     Objective: Vital Signs: BP 119/79 (BP Location: Left Arm, Patient Position: Sitting, Cuff Size: Large)   Pulse 71   Ht _0  (1.753 m)   Wt 223 lb (101.2 kg)   BMI 32.93 kg/m    Physical Exam Constitutional:      Appearance: She is obese.  HENT:     Right Ear: External ear normal.     Left Ear: External ear normal.     Mouth/Throat:     Mouth: Mucous membranes are moist.     Pharynx: Oropharynx is clear.  Eyes:     Conjunctiva/sclera: Conjunctivae normal.   Cardiovascular:     Rate and Rhythm: Normal rate and regular rhythm.  Pulmonary:     Effort: Pulmonary effort is normal.     Breath sounds: Normal breath sounds.  Skin:    General: Skin is warm and dry.     Comments: Facial acneiform rash Erythematous well demarcated patch on right upper arm Normal nailfold capillary appearance bilaterally  Neurological:     General: No focal deficit present.     Mental Status: She is alert.     Deep Tendon Reflexes: Reflexes normal.  Psychiatric:        Mood and Affect: Mood normal.    Musculoskeletal Exam:  Neck full ROM no tenderness Shoulders full ROM no tenderness or swelling Elbows full ROM no tenderness or swelling Wrists full ROM no tenderness or swelling Fingers full ROM no tenderness or  swelling Hip normal internal and external rotation, no tenderness to lateral hip palpation, mild left lateral hip discomfort with flexion and external rotation Knees full ROM no tenderness or swelling, mild patellofemoral crepitus of right knee mildly positive patellar grind test Ankles full ROM no tenderness or swelling    Investigation: No additional findings.  Imaging: No results found.  Recent Labs: Lab Results  Component Value Date   WBC 8.9 09/01/2020   HGB 13.3 09/01/2020   PLT 286.0 09/01/2020   NA 136 09/01/2020   K 3.7 09/01/2020   CL 99 09/01/2020   CO2 32 09/01/2020   GLUCOSE 101 (H) 09/01/2020   BUN 6 09/01/2020   CREATININE 0.89 09/01/2020   BILITOT 0.7 09/01/2020   ALKPHOS 71 09/01/2020   AST 21 09/01/2020   ALT 20 09/01/2020   PROT 8.2 09/01/2020   ALBUMIN 4.4 09/01/2020   CALCIUM 9.1 09/01/2020   GFRAA >60 12/13/2018    Speciality Comments: No specialty comments available.  Procedures:  No procedures performed Allergies: Patient has no known allergies.   Assessment / Plan:     Visit Diagnoses: Positive ANA (antinuclear antibody)  Low positive ANA titer and no specific clinical inflammatory findings on exam  and history today. Very low suspicion for any autoimmune condition, discussed with patient and recommend no further serology workup at this time. Recommend continued f/u with therapy and orthopedics at this time.  Pain in left hip  She is continuing with the Pt referral, so far lack of response to glucocorticoids does not seem typical for any inflammatory process.  Chronic pain of right knee  Appears to have some patellofemoral pain syndrome with the anterior pain with climbing but also instability that is not typical for this.  Orders: No orders of the defined types were placed in this encounter.  No orders of the defined types were placed in this encounter.    Follow-Up Instructions: No follow-ups on file.   Collier Salina, MD  Note - This record has been created using [employer].  Chart creation errors have been sought, but may not always  have been located. Such creation errors do not reflect on  the standard of medical care.

## 2020-10-26 ENCOUNTER — Ambulatory Visit: Payer: No Typology Code available for payment source | Admitting: Rehabilitative and Restorative Service Providers"

## 2020-10-26 ENCOUNTER — Other Ambulatory Visit: Payer: Self-pay

## 2020-10-26 ENCOUNTER — Encounter: Payer: Self-pay | Admitting: Rehabilitative and Restorative Service Providers"

## 2020-10-26 DIAGNOSIS — M6281 Muscle weakness (generalized): Secondary | ICD-10-CM

## 2020-10-26 DIAGNOSIS — R262 Difficulty in walking, not elsewhere classified: Secondary | ICD-10-CM

## 2020-10-26 DIAGNOSIS — M25561 Pain in right knee: Secondary | ICD-10-CM

## 2020-10-26 DIAGNOSIS — M25552 Pain in left hip: Secondary | ICD-10-CM | POA: Diagnosis not present

## 2020-10-26 DIAGNOSIS — G8929 Other chronic pain: Secondary | ICD-10-CM

## 2020-10-26 NOTE — Therapy (Signed)
[facility] Physical Therapy [address] [place], [place], [address] Phone: [phone]   Fax:  [phone]  Physical Therapy Treatment  Patient Details  Name: Deanna Cook MRN: [id number] Date of Birth: [birth year] Referring Provider (PT): Aundra Dubin, [place]   Encounter Date: 10/26/2020   PT End of Session - 10/26/20 0802     Visit Number 2    Number of Visits 20    Date for PT Re-Evaluation 12/16/20    Authorization Type [facility] $40 copay    Progress Note Due on Visit 10    PT Start Time 0802    PT Stop Time 0842    PT Time Calculation (min) 40 min    Activity Tolerance Patient tolerated treatment well    Behavior During Therapy [facility] for tasks assessed/performed             Past Medical History:  Diagnosis Date   Allergic rhinitis    Allergy    Fibroid    Hypertension     Past Surgical History:  Procedure Laterality Date   ABDOMINAL HYSTERECTOMY     LAVH-2014   APPENDECTOMY  1989   COLONOSCOPY  2005   about 20 years ago in [place] exam   [place] in retina    There were no vitals filed for this visit.   Subjective Assessment - 10/26/20 0805     Subjective Pt. indicated she felt like her knee wasn't as noticable but still had complaints in hip primarily.  Stairs were much better on knee.    Limitations Standing;Walking    Diagnostic tests xrays Lt hip unremarkable. Rt knee mild degenerative changes, lumbar degenerative changes    Patient Stated Goals Reduce pain    Currently in Pain? Yes    Pain Score 7     Pain Orientation Left    Pain Descriptors / Indicators Sharp    Pain Type Chronic pain    Pain Onset More than a month ago    Pain Frequency Intermittent    Aggravating Factors  transfers after sitting, walking    Pain Relieving Factors nothing specific    Multiple Pain Sites No    Pain Score 0    Pain Location Knee    Pain Orientation Left    Pain Onset More than a month ago                 [facility] PT Assessment - 10/26/20 0001       Assessment   Medical Diagnosis M25.552 (ICD-10-CM) - Pain in left hip    Referring Provider (PT) Aundra Dubin, PA-C    Onset Date/Surgical Date 10/08/19    Hand Dominance Left      PROM   Left Hip Flexion 120    Left Hip External Rotation  55                           [facility] Adult PT Treatment/Exercise - 10/26/20 0001       Knee/Hip Exercises: Stretches   Other Knee/Hip Stretches figure 4 supine press away and pull toward 30 sec x 2 Lt      Knee/Hip Exercises: Aerobic   Nustep Lvl 6 10 mins      Knee/Hip Exercises: Seated   Other Seated Knee/Hip Exercises seated slr 2 x 10 bilateral    Sit to Sand without UE support;10 reps   18 inch chair, slow eccentric focus  Knee/Hip Exercises: Sidelying   Clams 2 x 10 Lt, Reverse clam 2 x 10 Lt      Manual Therapy   Manual therapy comments inferior Lt hip, lateral hip, PA mobs c ER/IR mobilization c movement g3 all directions                     PT Education - 10/26/20 0819     Education Details HEP progression    Person(s) Educated Patient    Methods Explanation;Demonstration;Verbal cues;Handout    Comprehension Returned demonstration;Verbalized understanding              PT Short Term Goals - 10/26/20 0819       PT SHORT TERM GOAL #1   Title Patient will demonstrate independent use of home exercise program to maintain progress from in clinic treatments.    Time 3    Period Weeks    Status On-going    Target Date 10/28/20               PT Long Term Goals - 10/07/20 0828       PT LONG TERM GOAL #1   Title Patient will demonstrate/report pain at worst less than or equal to 2/10 to facilitate minimal limitation in daily activity secondary to pain symptoms.    Time 10    Period Weeks    Status New    Target Date 12/16/20      PT LONG TERM GOAL #2   Title Patient will demonstrate independent use of home exercise program to  facilitate ability to maintain/progress functional gains from skilled physical therapy services.    Time 10    Period Weeks    Status New    Target Date 12/16/20      PT LONG TERM GOAL #3   Title Pt. will demonstrate FOTO outcome > or = 72% to indicated reduced disability due to condition.    Time 10    Period Weeks    Status New    Target Date 12/16/20      PT LONG TERM GOAL #4   Title Patient will demonstrate bilateral LE MMT 5/5 throughout to facilitate ability to perform usual standing, walking, stairs at PLOF s limitation due to symptoms.    Time 10    Period Weeks    Status New    Target Date 12/16/20      PT LONG TERM GOAL #5   Title Pt. will demonstrate Lt hip AROM/PROM equal to Rt to facilitate usual mobility for daily activity.    Time 10    Period Weeks    Status New    Target Date 12/16/20                   Plan - 10/26/20 6734     Clinical Impression Statement Lt hip complaints still noted, specifcally c end range ER, flexion with symptoms noted in anterior hip/groin region.  Performance of hip passive mobility intervention improved hip joint mobility as documented in flow sheet.  Pt. to continue to benefit from skilled PT services to progress hip joint mobility and strength to facilitate reduced symptoms and limitations.    Examination-Activity Limitations Locomotion Level;Transfers;Bend;Squat;Stairs;Stand;Bed Mobility    Examination-Participation Restrictions Interpersonal Relationship;Community Activity;Occupation;Shop;Cleaning    Stability/Clinical Decision Making Stable/Uncomplicated    Rehab Potential Good    PT Frequency Other (comment)   1-2x/week   PT Duration Other (comment)   10 weeks   PT Treatment/Interventions ADLs/Self Care  Home Management;Cryotherapy;Electrical Stimulation;Iontophoresis 4mg /ml Dexamethasone;Moist Heat;Balance training;Therapeutic exercise;Therapeutic activities;Functional mobility training;Stair training;Gait training;DME  Instruction;Ultrasound;Neuromuscular re-education;Patient/family education;Passive range of motion;Spinal Manipulations;Joint Manipulations;Dry needling;Taping;Manual techniques    PT Next Visit Plan Lt hip joint passive accessory interventions, progress hip strengthening/quad strengthening continued.    PT Home Exercise Plan [email or web address]    Consulted and Agree with Plan of Care Patient             Patient will benefit from skilled therapeutic intervention in order to improve the following deficits and impairments:  Pain, Decreased strength, Decreased activity tolerance, Decreased mobility, Difficulty walking, Decreased balance, Decreased range of motion, Impaired perceived functional ability, Improper body mechanics, Impaired flexibility, Decreased coordination, Hypomobility  Visit Diagnosis: Pain in left hip  Chronic pain of right knee  Muscle weakness (generalized)  Difficulty in walking, not elsewhere classified     Problem List Patient Active Problem List   Diagnosis Date Noted   Positive ANA (antinuclear antibody) 10/21/2020   Pain in left hip 10/21/2020   Pain in right knee 10/21/2020   Acne vulgaris 06/04/2019   Hyperpigmentation 06/04/2019   Essential hypertension 02/11/2019   Refusal of blood transfusions as patient is Jehovah's Witness 08/07/2018   Seasonal allergies 08/07/2018   Scot Jun, PT, DPT, OCS, ATC 10/26/20  8:49 AM    [facility] Physical Therapy [address] [place], [place], [address] Phone: [phone]   Fax:  [phone]  Name: Deanna Cook MRN: [id number] Date of Birth: 1970/04/10

## 2020-10-26 NOTE — Patient Instructions (Signed)
Access Code: [email or web address] URL: [email or web address] Date: 10/26/2020 Prepared by: Scot Jun  Exercises Clamshell - 2 x daily - 7 x weekly - 3 sets - 10 reps Supine Figure 4 Piriformis Stretch (Mirrored) - 2 x daily - 7 x weekly - 1 sets - 5 reps - 30 hold Supine Piriformis Stretch with Foot on Ground - 2 x daily - 7 x weekly - 1 sets - 5 reps - 30 hold Seated Straight Leg Heel Taps - 2 x daily - 7 x weekly - 3 sets - 10 reps Sit to Stand - 2 x daily - 7 x weekly - 3 sets - 10 reps Sidelying Reverse Clamshell - 2 x daily - 7 x weekly - 3 sets - 10 reps

## 2020-10-31 ENCOUNTER — Other Ambulatory Visit: Payer: Self-pay

## 2020-10-31 ENCOUNTER — Ambulatory Visit (AMBULATORY_SURGERY_CENTER): Payer: No Typology Code available for payment source | Admitting: Gastroenterology

## 2020-10-31 ENCOUNTER — Encounter: Payer: Self-pay | Admitting: Gastroenterology

## 2020-10-31 VITALS — BP 123/78 | HR 68 | Temp 97.3°F | Resp 13 | Ht 69.0 in | Wt 218.0 lb

## 2020-10-31 DIAGNOSIS — Z800 Family history of malignant neoplasm of digestive organs: Secondary | ICD-10-CM

## 2020-10-31 DIAGNOSIS — Z1211 Encounter for screening for malignant neoplasm of colon: Secondary | ICD-10-CM

## 2020-10-31 MED ORDER — SODIUM CHLORIDE 0.9 % IV SOLN: 500.0000 mL | Freq: Once | INTRAVENOUS | Status: DC

## 2020-10-31 NOTE — Progress Notes (Signed)
Report given to PACU, vss 

## 2020-10-31 NOTE — Progress Notes (Signed)
VS completed by DT.  Pt's states no medical or surgical changes since previsit or office visit.  

## 2020-10-31 NOTE — Op Note (Signed)
[facility] Patient Name: Deanna Cook Procedure Date: 10/31/2020 12:00 PM MRN: [id number] Endoscopist: Mauri Pole , MD Age: 50 Referring MD:  Date of Birth: [birth year] Gender: Female Account #: [id number] Procedure:                Colonoscopy Indications:              Screening patient at increased risk: Family history                            of 1st-degree relative with colorectal cancer at                            age 5 years (or older) Medicines:                Monitored Anesthesia Care Procedure:                Pre-Anesthesia Assessment:                           - Prior to the procedure, a History and Physical                            was performed, and patient medications and                            allergies were reviewed. The patient's tolerance of                            previous anesthesia was also reviewed. The risks                            and benefits of the procedure and the sedation                            options and risks were discussed with the patient.                            All questions were answered, and informed consent                            was obtained. Prior Anticoagulants: The patient has                            taken no previous anticoagulant or antiplatelet                            agents. ASA Grade Assessment: II - A patient with                            mild systemic disease. After reviewing the risks                            and benefits, the patient was deemed in  satisfactory condition to undergo the procedure.                           After obtaining informed consent, the colonoscope                            was passed under direct vision. Throughout the                            procedure, the patient's blood pressure, pulse, and                            oxygen saturations were monitored continuously. The                            PCF-HQ190L Colonoscope was  introduced through the                            anus and advanced to the the ileocolonic                            anastomosis. The colonoscopy was performed without                            difficulty. The patient tolerated the procedure                            well. The quality of the bowel preparation was                            good. The terminal ileum and the rectum were                            photographed. Scope In: 12:07:33 PM Scope Out: 12:21:09 PM Scope Withdrawal Time: 0 hours 11 minutes 6 seconds  Total Procedure Duration: 0 hours 13 minutes 36 seconds  Findings:                 The perianal and digital rectal examinations were                            normal.                           There was evidence of a prior functional end-to-end                            ileo-colonic anastomosis in the ascending colon.                            This was patent and was characterized by healthy                            appearing mucosa. The anastomosis was traversed.  Non-bleeding external and internal hemorrhoids were                            found during retroflexion. The hemorrhoids were                            medium-sized.                           The exam was otherwise without abnormality. Complications:            No immediate complications. Estimated Blood Loss:     Estimated blood loss was minimal. Impression:               - Patent functional end-to-end ileo-colonic                            anastomosis, characterized by healthy appearing                            mucosa.                           - Non-bleeding external and internal hemorrhoids.                           - The examination was otherwise normal.                           - No specimens collected. Recommendation:           - Patient has a contact number available for                            emergencies. The signs and symptoms of potential                             delayed complications were discussed with the                            patient. Return to normal activities tomorrow.                            Written discharge instructions were provided to the                            patient.                           - Resume previous diet.                           - Continue present medications.                           - Repeat colonoscopy in 5 years for screening  purposes.                           - Return to GI clinic PRN. Mauri Pole, MD 10/31/2020 12:26:38 PM This report has been signed electronically.

## 2020-10-31 NOTE — Patient Instructions (Signed)
YOU HAD AN ENDOSCOPIC PROCEDURE TODAY AT THE [facility]:   Refer to the procedure report that was given to you for any specific questions about what was found during the examination.  If the procedure report does not answer your questions, please call your gastroenterologist to clarify.  If you requested that your care partner not be given the details of your procedure findings, then the procedure report has been included in a sealed envelope for you to review at your convenience later.  YOU SHOULD EXPECT: Some feelings of bloating in the abdomen. Passage of more gas than usual.  Walking can help get rid of the air that was put into your GI tract during the procedure and reduce the bloating. If you had a lower endoscopy (such as a colonoscopy or flexible sigmoidoscopy) you may notice spotting of blood in your stool or on the toilet paper. If you underwent a bowel prep for your procedure, you may not have a normal bowel movement for a few days.  Please Note:  You might notice some irritation and congestion in your nose or some drainage.  This is from the oxygen used during your procedure.  There is no need for concern and it should clear up in a day or so.  SYMPTOMS TO REPORT IMMEDIATELY:   Following lower endoscopy (colonoscopy or flexible sigmoidoscopy):  Excessive amounts of blood in the stool  Significant tenderness or worsening of abdominal pains  Swelling of the abdomen that is new, acute  Fever of 100F or higher  For urgent or emergent issues, a gastroenterologist can be reached at any hour by calling [phone]. Do not use MyChart messaging for urgent concerns.    DIET:  We do recommend a small meal at first, but then you may proceed to your regular diet.  Drink plenty of fluids but you should avoid alcoholic beverages for 24 hours.  ACTIVITY:  You should plan to take it easy for the rest of today and you should NOT DRIVE or use heavy machinery until tomorrow (because  of the sedation medicines used during the test).    FOLLOW UP: Our staff will call the number listed on your records 48-72 hours following your procedure to check on you and address any questions or concerns that you may have regarding the information given to you following your procedure. If we do not reach you, we will leave a message.  We will attempt to reach you two times.  During this call, we will ask if you have developed any symptoms of COVID 19. If you develop any symptoms (ie: fever, flu-like symptoms, shortness of breath, cough etc.) before then, please call [phone].  If you test positive for Covid 19 in the 2 weeks post procedure, please call and report this information to us.    If any biopsies were taken you will be contacted by phone or by letter within the next 1-3 weeks.  Please call us at [phone] if you have not heard about the biopsies in 3 weeks.    SIGNATURES/CONFIDENTIALITY: You and/or your care partner have signed paperwork which will be entered into your electronic medical record.  These signatures attest to the fact that that the information above on your After Visit Summary has been reviewed and is understood.  Full responsibility of the confidentiality of this discharge information lies with you and/or your care-partner. 

## 2020-10-31 NOTE — Progress Notes (Addendum)
[facility] History and Physical   Primary Care Physician:  Horald Pollen, MD   Reason for Procedure:  Colorectal cancer screening, family history of colon cancer  Plan:    Screening colonoscopy with possible interventions as needed     HPI: Ceclia Cook is a very pleasant 50 y.o. female here for screening colonoscopy. Denies any nausea, vomiting, abdominal pain, melena or bright red blood per rectum  Mother had colon cancer in her 67's  The risks and benefits as well as alternatives of endoscopic procedure(s) have been discussed and reviewed. All questions answered. The patient agrees to proceed.    Past Medical History:  Diagnosis Date   Allergic rhinitis    Allergy    Fibroid    Hypertension     Past Surgical History:  Procedure Laterality Date   ABDOMINAL HYSTERECTOMY     LAVH-2014   APPENDECTOMY  1989   COLONOSCOPY  2005   about 20 years ago in [place] exam   [place] in retina    Prior to Admission medications   Medication Sig Start Date End Date Taking? Authorizing Provider  amLODipine (NORVASC) 10 MG tablet Take 1 tablet (10 mg total) by mouth daily. 06/22/20  Yes Sagardia, Ines Bloomer, MD  Ascorbic Acid (VITAMIN C PO) Take by mouth.   Yes [provider]  Cyanocobalamin (B-12) 1000 MCG TABS Take 1 tablet by mouth daily.   Yes [provider]  fexofenadine (ALLEGRA) 180 MG tablet Take 180 mg by mouth daily.   Yes [provider]  fluticasone (FLONASE) 50 MCG/ACT nasal spray Place into both nostrils daily.   Yes [provider]  Multiple Vitamin (MULTIVITAMIN) capsule Take 1 capsule by mouth daily.   Yes [provider]  APPLE CIDER VINEGAR PO Take by mouth.    [provider]    Current Outpatient Medications  Medication Sig Dispense Refill   amLODipine (NORVASC) 10 MG tablet Take 1 tablet (10 mg total) by mouth daily. 90 tablet 3   Ascorbic Acid (VITAMIN C  PO) Take by mouth.     Cyanocobalamin (B-12) 1000 MCG TABS Take 1 tablet by mouth daily.     fexofenadine (ALLEGRA) 180 MG tablet Take 180 mg by mouth daily.     fluticasone (FLONASE) 50 MCG/ACT nasal spray Place into both nostrils daily.     Multiple Vitamin (MULTIVITAMIN) capsule Take 1 capsule by mouth daily.     APPLE CIDER VINEGAR PO Take by mouth.     Current Facility-Administered Medications  Medication Dose Route Frequency Provider Last Rate Last Admin   0.9 %  sodium chloride infusion  500 mL Intravenous Once Mauri Pole, MD        Allergies as of 10/31/2020   (No Known Allergies)    Family History  Problem Relation Age of Onset   Colon polyps Mother    Hypertension Mother    Colon cancer Mother 68   Colon polyps Father    Heart attack Father    Diabetes Mellitus I Father    Kidney failure Father    Thyroid cancer Sister    Fibromyalgia Sister    Kidney disease Brother    Esophageal cancer Neg Hx    Stomach cancer Neg Hx    Rectal cancer Neg Hx     Social History   Socioeconomic History   Marital status: Married    Spouse name: Not on file   Number of children: Not  on file   Years of education: Not on file   Highest education level: Not on file  Occupational History   Not on file  Tobacco Use   Smoking status: Never   Smokeless tobacco: Never  Vaping Use   Vaping Use: Never used  Substance and Sexual Activity   Alcohol use: Yes    Alcohol/week: 3.0 standard drinks    Types: 3 Standard drinks or equivalent per week    Comment: social   Drug use: Never   Sexual activity: Yes    Birth control/protection: Surgical    Comment: 1st intercourse 50 yo-More than 5 partners  Other Topics Concern   Not on file  Social History Narrative   Not on file   Social Determinants of Health   Financial Resource Strain: Not on file  Food Insecurity: Not on file  Transportation Needs: Not on file  Physical Activity: Not on file  Stress: Not on file   Social Connections: Not on file  Intimate Partner Violence: Not on file    Review of Systems:  All other review of systems negative except as mentioned in the HPI.  Physical Exam: Vital signs in last 24 hours: BP (!) 151/93   Pulse 75   Temp (!) 97.3 F (36.3 C) (Temporal)   Ht 5\' 9"  (1.753 m)   Wt 218 lb (98.9 kg)   SpO2 98%   BMI 32.19 kg/m     General:   Alert, NAD Lungs:  Clear .   Heart:  Regular rate and rhythm Abdomen:  Soft, nontender and nondistended. Neuro/Psych:  Alert and cooperative. Normal mood and affect. A and O x 3  Reviewed labs, radiology imaging, old records and pertinent past GI work up  Patient is appropriate for planned procedure(s) and anesthesia in an ambulatory setting   K. Denzil Magnuson , MD [phone]

## 2020-11-02 ENCOUNTER — Encounter: Payer: No Typology Code available for payment source | Admitting: Rehabilitative and Restorative Service Providers"

## 2020-11-02 ENCOUNTER — Telehealth: Payer: Self-pay

## 2020-11-02 NOTE — Telephone Encounter (Signed)
  Follow up Call-  Call back number 10/31/2020  Post procedure Call Back phone  # [phone]  Permission to leave phone message Yes     Patient questions:  Do you have a fever, pain , or abdominal swelling? No. Pain Score  0 *  Have you tolerated food without any problems? Yes.    Have you been able to return to your normal activities? Yes.    Do you have any questions about your discharge instructions: Diet   No. Medications  No. Follow up visit  No.  Do you have questions or concerns about your Care? No.  Actions: * If pain score is 4 or above: No action needed, pain <4.

## 2020-11-09 ENCOUNTER — Ambulatory Visit: Payer: No Typology Code available for payment source | Admitting: Rehabilitative and Restorative Service Providers"

## 2020-11-09 ENCOUNTER — Other Ambulatory Visit: Payer: Self-pay

## 2020-11-09 ENCOUNTER — Encounter: Payer: Self-pay | Admitting: Rehabilitative and Restorative Service Providers"

## 2020-11-09 DIAGNOSIS — M6281 Muscle weakness (generalized): Secondary | ICD-10-CM | POA: Diagnosis not present

## 2020-11-09 DIAGNOSIS — M25552 Pain in left hip: Secondary | ICD-10-CM | POA: Diagnosis not present

## 2020-11-09 DIAGNOSIS — M25561 Pain in right knee: Secondary | ICD-10-CM | POA: Diagnosis not present

## 2020-11-09 DIAGNOSIS — R262 Difficulty in walking, not elsewhere classified: Secondary | ICD-10-CM | POA: Diagnosis not present

## 2020-11-09 DIAGNOSIS — G8929 Other chronic pain: Secondary | ICD-10-CM

## 2020-11-09 NOTE — Therapy (Signed)
[facility] [address] [place], [place], [address] Phone: [phone]   Fax:  [phone]  Physical Therapy Treatment  Patient Details  Name: Deanna Cook MRN: [id number] Date of Birth: 15-Dec-1970 Referring Provider (PT): Aundra Dubin, [place]   Encounter Date: 11/09/2020   PT End of Session - 11/09/20 0840     Visit Number 3    Number of Visits 20    Date for PT Re-Evaluation 12/16/20    Authorization Type [facility] $40 copay    Progress Note Due on Visit 10    PT Start Time 0817    PT Stop Time 0856    PT Time Calculation (min) 39 min    Activity Tolerance Patient tolerated treatment well    Behavior During Therapy [facility] for tasks assessed/performed             Past Medical History:  Diagnosis Date   Allergic rhinitis    Allergy    Fibroid    Hypertension     Past Surgical History:  Procedure Laterality Date   ABDOMINAL HYSTERECTOMY     LAVH-2014   APPENDECTOMY  1989   COLONOSCOPY  2005   about 20 years ago in [place] exam   [place] in retina    There were no vitals filed for this visit.   Subjective Assessment - 11/09/20 0834     Subjective Pt. indicated knee continued to be doing well.  Pt. stated hip can still be noted but having less severe pain and gains noted in functional movements.    Limitations Standing;Walking    Diagnostic tests xrays Lt hip unremarkable. Rt knee mild degenerative changes, lumbar degenerative changes    Patient Stated Goals Reduce pain    Currently in Pain? No/denies   none upon arrival at rest   Pain Score 5    5/10 at worst in last week   Pain Location Hip    Pain Orientation Left    Pain Descriptors / Indicators Sharp;Tightness    Pain Type Chronic pain    Pain Onset More than a month ago    Pain Frequency Intermittent    Aggravating Factors  end range movement, putting on socks/shoes c Lt foot    Pain Score 0    Pain Location Knee    Pain Onset More  than a month ago                [facility] PT Assessment - 11/09/20 0001       Assessment   Medical Diagnosis M25.552 (ICD-10-CM) - Pain in left hip    Referring Provider (PT) Aundra Dubin, PA-C    Onset Date/Surgical Date 10/08/19    Hand Dominance Left      Observation/Other Assessments   Focus on Therapeutic Outcomes (FOTO)  update 72%      AROM   Overall AROM Comments measured in supine    Left Hip Flexion 120    Left Hip External Rotation  51    Left Hip Internal Rotation  50                           [facility] Adult PT Treatment/Exercise - 11/09/20 0001       Knee/Hip Exercises: Stretches   Other Knee/Hip Stretches figure 4 supine press away and pull toward 30 sec x 3 Lt      Knee/Hip Exercises: Machines for Strengthening  Cybex Leg Press Double leg 2 x 10 75 lbs, 2 x 10 single leg 43 lbs, performed bilateral      Knee/Hip Exercises: Supine   Bridges 20 reps;Both      Manual Therapy   Manual therapy comments lateral, lateral/inferior g4 Lt hip mobilizations, prone PA g4 mobs in ER/abduction                       PT Short Term Goals - 11/09/20 0835       PT SHORT TERM GOAL #1   Title Patient will demonstrate independent use of home exercise program to maintain progress from in clinic treatments.    Time 3    Period Weeks    Status Achieved    Target Date 10/28/20               PT Long Term Goals - 11/09/20 0835       PT LONG TERM GOAL #1   Title Patient will demonstrate/report pain at worst less than or equal to 2/10 to facilitate minimal limitation in daily activity secondary to pain symptoms.    Time 10    Period Weeks    Status On-going    Target Date 12/16/20      PT LONG TERM GOAL #2   Title Patient will demonstrate independent use of home exercise program to facilitate ability to maintain/progress functional gains from skilled physical therapy services.    Time 10    Period Weeks    Status On-going     Target Date 12/16/20      PT LONG TERM GOAL #3   Title Pt. will demonstrate FOTO outcome > or = 72% to indicated reduced disability due to condition.    Time 10    Period Weeks    Status New      PT LONG TERM GOAL #4   Title Patient will demonstrate bilateral LE MMT 5/5 throughout to facilitate ability to perform usual standing, walking, stairs at PLOF s limitation due to symptoms.    Time 10    Period Weeks    Status On-going    Target Date 12/16/20      PT LONG TERM GOAL #5   Title Pt. will demonstrate Lt hip AROM/PROM equal to Rt to facilitate usual mobility for daily activity.    Time 10    Period Weeks    Status On-going    Target Date 12/16/20                   Plan - 11/09/20 0839     Clinical Impression Statement Measurement reassessment demonstrated improvement in active mobility related to Lt hip.  Pt. may continue to benefit from skilled PT services c hip joint mobilizations to continue to gain mobility for functional movements and symptom reduction.    Examination-Activity Limitations Locomotion Level;Transfers;Bend;Squat;Stairs;Stand;Bed Mobility    Examination-Participation Restrictions Interpersonal Relationship;Community Activity;Occupation;Shop;Cleaning    Stability/Clinical Decision Making Stable/Uncomplicated    Rehab Potential Good    PT Frequency Other (comment)   1-2x/week   PT Duration Other (comment)   10 weeks   PT Treatment/Interventions ADLs/Self Care Home Management;Cryotherapy;Electrical Stimulation;Iontophoresis 4mg /ml Dexamethasone;Moist Heat;Balance training;Therapeutic exercise;Therapeutic activities;Functional mobility training;Stair training;Gait training;DME Instruction;Ultrasound;Neuromuscular re-education;Patient/family education;Passive range of motion;Spinal Manipulations;Joint Manipulations;Dry needling;Taping;Manual techniques    PT Next Visit Plan Lt hip joint passive accessory interventions focus for mobility gains.    PT Home  Exercise Plan [id number]    Consulted and Agree with Plan  of Care Patient             Patient will benefit from skilled therapeutic intervention in order to improve the following deficits and impairments:  Pain, Decreased strength, Decreased activity tolerance, Decreased mobility, Difficulty walking, Decreased balance, Decreased range of motion, Impaired perceived functional ability, Improper body mechanics, Impaired flexibility, Decreased coordination, Hypomobility  Visit Diagnosis: Pain in left hip  Chronic pain of right knee  Muscle weakness (generalized)  Difficulty in walking, not elsewhere classified     Problem List Patient Active Problem List   Diagnosis Date Noted   Positive ANA (antinuclear antibody) 10/21/2020   Pain in left hip 10/21/2020   Pain in right knee 10/21/2020   Acne vulgaris 06/04/2019   Hyperpigmentation 06/04/2019   Essential hypertension 02/11/2019   Refusal of blood transfusions as patient is Jehovah's Witness 08/07/2018   Seasonal allergies 08/07/2018    Scot Jun, PT, DPT, OCS, ATC 11/09/20  8:54 AM    [facility] [address] [place], [place], [address] Phone: [phone]   Fax:  [phone]  Name: Deanna Cook MRN: [id number] Date of Birth: 03/15/1970

## 2020-11-16 ENCOUNTER — Encounter: Payer: No Typology Code available for payment source | Admitting: Rehabilitative and Restorative Service Providers"

## 2020-11-23 ENCOUNTER — Ambulatory Visit: Payer: No Typology Code available for payment source | Admitting: Rehabilitative and Restorative Service Providers"

## 2020-11-23 ENCOUNTER — Encounter: Payer: Self-pay | Admitting: Rehabilitative and Restorative Service Providers"

## 2020-11-23 ENCOUNTER — Other Ambulatory Visit: Payer: Self-pay

## 2020-11-23 DIAGNOSIS — M25561 Pain in right knee: Secondary | ICD-10-CM | POA: Diagnosis not present

## 2020-11-23 DIAGNOSIS — M25552 Pain in left hip: Secondary | ICD-10-CM

## 2020-11-23 DIAGNOSIS — R262 Difficulty in walking, not elsewhere classified: Secondary | ICD-10-CM | POA: Diagnosis not present

## 2020-11-23 DIAGNOSIS — G8929 Other chronic pain: Secondary | ICD-10-CM

## 2020-11-23 DIAGNOSIS — M6281 Muscle weakness (generalized): Secondary | ICD-10-CM

## 2020-11-23 NOTE — Therapy (Addendum)
[facility] [address] [place], [place], [address] Phone: [phone]   Fax:  [phone]  Physical Therapy Treatment/Discharge   Patient Details  Name: Deanna Cook MRN: [id number] Date of Birth: Dec 10, 1970 Referring Provider (PT): Aundra Dubin, [place]   Encounter Date: 11/23/2020   PT End of Session - 11/23/20 0821     Visit Number 4    Number of Visits 20    Date for PT Re-Evaluation 12/16/20    Authorization Type [facility] $40 copay    Progress Note Due on Visit 10    PT Start Time 0806    PT Stop Time 0845    PT Time Calculation (min) 39 min    Activity Tolerance Patient tolerated treatment well    Behavior During Therapy [facility] for tasks assessed/performed             Past Medical History:  Diagnosis Date   Allergic rhinitis    Allergy    Fibroid    Hypertension     Past Surgical History:  Procedure Laterality Date   ABDOMINAL HYSTERECTOMY     LAVH-2014   APPENDECTOMY  1989   COLONOSCOPY  2005   about 20 years ago in [place] exam   [place] in retina    There were no vitals filed for this visit.   Subjective Assessment - 11/23/20 0816     Subjective Pt. indicated knee is doing better.  Pt. indicated continued complaints in Lt hip, noted after sitting prolonged and in morning.  Pt. stated restriction and deep hip pain noted c movements such as putting on sock/shoe. Somewhat better +3    Limitations Standing;Walking    Diagnostic tests xrays Lt hip unremarkable. Rt knee mild degenerative changes, lumbar degenerative changes    Patient Stated Goals Reduce pain    Currently in Pain? No/denies   no pain upon arrival.   Pain Score 0-No pain    Pain Onset More than a month ago    Pain Onset More than a month ago                [facility] PT Assessment - 11/23/20 0001       Assessment   Medical Diagnosis M25.552 (ICD-10-CM) - Pain in left hip    Referring Provider (PT) Aundra Dubin,  PA-C    Onset Date/Surgical Date 10/08/19    Hand Dominance Left      Observation/Other Assessments   Focus on Therapeutic Outcomes (FOTO)  update 78%      AROM   Lumbar Extension 75% WFL, no complaints in hip.  Repeated x 5 in standing improved movement to 100%                           [facility] Adult PT Treatment/Exercise - 11/23/20 0001       Exercises   Other Exercises  Review of HEP      Knee/Hip Exercises: Stretches   Other Knee/Hip Stretches seated figure 4 push down, pull up 30 sec x 3 bilateral ( cues for home use)      Knee/Hip Exercises: Aerobic   Stationary Bike Lvl 3 10 mins      Knee/Hip Exercises: Standing   Other Standing Knee Exercises standing lumbar extension x 10      Manual Therapy   Manual therapy comments prone g4 pa mobs Lt hip in FABER positioning  PT Short Term Goals - 11/09/20 0835       PT SHORT TERM GOAL #1   Title Patient will demonstrate independent use of home exercise program to maintain progress from in clinic treatments.    Time 3    Period Weeks    Status Achieved    Target Date 10/28/20               PT Long Term Goals - 11/23/20 0840       PT LONG TERM GOAL #1   Title Patient will demonstrate/report pain at worst less than or equal to 2/10 to facilitate minimal limitation in daily activity secondary to pain symptoms.    Time 10    Period Weeks    Status On-going    Target Date 12/16/20      PT LONG TERM GOAL #2   Title Patient will demonstrate independent use of home exercise program to facilitate ability to maintain/progress functional gains from skilled physical therapy services.    Time 10    Period Weeks    Status On-going    Target Date 12/16/20      PT LONG TERM GOAL #3   Title Pt. will demonstrate FOTO outcome > or = 72% to indicated reduced disability due to condition.    Time 10    Period Weeks    Status Achieved      PT LONG TERM GOAL #4   Title  Patient will demonstrate bilateral LE MMT 5/5 throughout to facilitate ability to perform usual standing, walking, stairs at PLOF s limitation due to symptoms.    Time 10    Period Weeks    Status On-going    Target Date 12/16/20      PT LONG TERM GOAL #5   Title Pt. will demonstrate Lt hip AROM/PROM equal to Rt to facilitate usual mobility for daily activity.    Time 10    Period Weeks    Status On-going    Target Date 12/16/20                   Plan - 11/23/20 0841     Clinical Impression Statement In review, FOTO update improved gain today compared to last time. Pt. has had continued complaints in Lt hip but retrospective analysis indicated reduced difficulty and improved movement noted.   Pt. was desiring switch to HEP trial today due to improvements and knowledge of HEP.    Examination-Activity Limitations Locomotion Level;Transfers;Bend;Squat;Stairs;Stand;Bed Mobility    Examination-Participation Restrictions Interpersonal Relationship;Community Activity;Occupation;Shop;Cleaning    Stability/Clinical Decision Making Stable/Uncomplicated    Rehab Potential Good    PT Frequency Other (comment)   1-2x/week   PT Duration Other (comment)   10 weeks   PT Treatment/Interventions ADLs/Self Care Home Management;Cryotherapy;Electrical Stimulation;Iontophoresis 4mg /ml Dexamethasone;Moist Heat;Balance training;Therapeutic exercise;Therapeutic activities;Functional mobility training;Stair training;Gait training;DME Instruction;Ultrasound;Neuromuscular re-education;Patient/family education;Passive range of motion;Spinal Manipulations;Joint Manipulations;Dry needling;Taping;Manual techniques    PT Next Visit Plan Trial HEP, hip joint mobilization and recertification possible on next visit.    PT Home Exercise Plan [id number]    Consulted and Agree with Plan of Care Patient             Patient will benefit from skilled therapeutic intervention in order to improve the following  deficits and impairments:  Pain, Decreased strength, Decreased activity tolerance, Decreased mobility, Difficulty walking, Decreased balance, Decreased range of motion, Impaired perceived functional ability, Improper body mechanics, Impaired flexibility, Decreased coordination, Hypomobility  Visit Diagnosis: Pain in left hip  Chronic pain of right knee  Muscle weakness (generalized)  Difficulty in walking, not elsewhere classified     Problem List Patient Active Problem List   Diagnosis Date Noted   Positive ANA (antinuclear antibody) 10/21/2020   Pain in left hip 10/21/2020   Pain in right knee 10/21/2020   Acne vulgaris 06/04/2019   Hyperpigmentation 06/04/2019   Essential hypertension 02/11/2019   Refusal of blood transfusions as patient is Jehovah's Witness 08/07/2018   Seasonal allergies 08/07/2018    Scot Jun, PT, DPT, OCS, ATC 11/23/20  8:48 AM  PHYSICAL THERAPY DISCHARGE SUMMARY  Visits from Start of Care: 4  Current functional level related to goals / functional outcomes: See note   Remaining deficits: See note   Education / Equipment: HEP   Patient agrees to discharge. Patient goals were partially met. Patient is being discharged due to not returning since the last visit.  Scot Jun, PT, DPT, OCS, ATC 01/09/21  10:00 AM     [facility] Physical Therapy [address] [place], [place], [address] Phone: [phone]   Fax:  [phone]  Name: Deanna Cook MRN: [id number] Date of Birth: [birth year]

## 2021-01-19 ENCOUNTER — Telehealth: Payer: No Typology Code available for payment source | Admitting: Physician Assistant

## 2021-01-19 DIAGNOSIS — S39012A Strain of muscle, fascia and tendon of lower back, initial encounter: Secondary | ICD-10-CM | POA: Diagnosis not present

## 2021-01-20 ENCOUNTER — Other Ambulatory Visit (HOSPITAL_COMMUNITY): Payer: Self-pay

## 2021-01-20 MED ORDER — NAPROXEN 500 MG PO TABS
500.0000 mg | ORAL_TABLET | Freq: Two times a day (BID) | ORAL | 0 refills | Status: DC
  Filled 2021-01-20: qty 30, 15d supply, fill #0

## 2021-01-20 MED ORDER — CYCLOBENZAPRINE HCL 10 MG PO TABS
10.0000 mg | ORAL_TABLET | Freq: Three times a day (TID) | ORAL | 0 refills | Status: DC | PRN
  Filled 2021-01-20: qty 30, 10d supply, fill #0

## 2021-01-20 NOTE — Progress Notes (Signed)

## 2021-01-23 ENCOUNTER — Encounter: Payer: Self-pay | Admitting: Emergency Medicine

## 2021-01-24 ENCOUNTER — Telehealth: Payer: No Typology Code available for payment source | Admitting: Emergency Medicine

## 2021-01-24 ENCOUNTER — Other Ambulatory Visit (HOSPITAL_COMMUNITY): Payer: Self-pay

## 2021-01-24 DIAGNOSIS — B029 Zoster without complications: Secondary | ICD-10-CM

## 2021-01-24 MED ORDER — VALACYCLOVIR HCL 1 G PO TABS
1000.0000 mg | ORAL_TABLET | Freq: Three times a day (TID) | ORAL | 0 refills | Status: DC
  Filled 2021-01-24: qty 21, 7d supply, fill #0

## 2021-01-24 NOTE — Progress Notes (Signed)
E-visit for Shingles   We are sorry that you are not feeling well. Here is how we plan to help!  Based on what you shared with me it looks like you have shingles.  Shingles or herpes zoster, is a common infection of the nerves.  It is a painful rash caused by the herpes zoster virus.  This is the same virus that causes chickenpox.  After a person has chickenpox, the virus remains inactive in the nerve cells.  Years later, the virus can become active again and travel to the skin.  It typically will appear on one side of the face or body.  Burning or shooting pain, tingling, or itching are early signs of the infection.  Blisters typically scab over in 7 to 10 days and clear up within 2-4 weeks. Shingles is only contagious to people that have never had the chickenpox, the chickenpox vaccine, or anyone who has a compromised immune system.  You should avoid contact with these type of people until your blisters scab over.  I have prescribed Valacyclovir 1g three times daily for 7 days.  HOME CARE: Apply ice packs (wrapped in a thin towel), cool compresses, or soak in cool bath to help reduce pain. Use calamine lotion to calm itchy skin. Avoid scratching the rash. Avoid direct sunlight.  GET HELP RIGHT AWAY IF: Symptoms that dont away after treatment. A rash or blisters near your eye. Increased drainage, fever, or rash after treatment. Severe pain that doesnt go away.   MAKE SURE YOU   Understand these instructions. Will watch your condition. Will get help right away if you are not doing well or get worse.  Thank you for choosing an e-visit.  Your e-visit answers were reviewed by a board certified advanced clinical practitioner to complete your personal care plan. Depending upon the condition, your plan could have included both over the counter or prescription medications.  Please review your pharmacy choice. Make sure the pharmacy is open so you can pick up prescription now. If there is  a problem, you may contact your provider through [employer] and have the prescription routed to another pharmacy.  Your safety is important to [place]. If you have drug allergies check your prescription carefully.   For the next 24 hours you can use MyChart to ask questions about today's visit, request a non-urgent call back, or ask for a work or school excuse. You will get an email in the next two days asking about your experience. I hope that your e-visit has been valuable and will speed your recovery.  Approximately 5 minutes was used in reviewing the patient's chart, questionnaire, prescribing medications, and documentation.

## 2021-01-25 NOTE — Telephone Encounter (Signed)
Called and spoke with pt in regards to message, she states she had an E visit and medication to treat the shingles was prescribed. She states that she is still in pain and would like to have medication for pain. Scheduled an OV.

## 2021-01-26 ENCOUNTER — Encounter: Payer: Self-pay | Admitting: Emergency Medicine

## 2021-01-26 ENCOUNTER — Other Ambulatory Visit (HOSPITAL_COMMUNITY): Payer: Self-pay

## 2021-01-26 ENCOUNTER — Ambulatory Visit (INDEPENDENT_AMBULATORY_CARE_PROVIDER_SITE_OTHER): Payer: No Typology Code available for payment source | Admitting: Emergency Medicine

## 2021-01-26 ENCOUNTER — Other Ambulatory Visit: Payer: Self-pay

## 2021-01-26 VITALS — BP 162/78 | HR 85 | Ht 69.0 in | Wt 222.0 lb

## 2021-01-26 DIAGNOSIS — B0223 Postherpetic polyneuropathy: Secondary | ICD-10-CM | POA: Diagnosis not present

## 2021-01-26 DIAGNOSIS — B029 Zoster without complications: Secondary | ICD-10-CM

## 2021-01-26 MED ORDER — OXYCODONE-ACETAMINOPHEN 5-325 MG PO TABS
1.0000 | ORAL_TABLET | Freq: Four times a day (QID) | ORAL | 0 refills | Status: AC | PRN
  Filled 2021-01-26: qty 20, 5d supply, fill #0

## 2021-01-26 NOTE — Patient Instructions (Signed)

## 2021-01-26 NOTE — Progress Notes (Signed)
Deanna Cook 50 y.o.   Chief Complaint  Patient presents with   Rash    Painful red bumps on stomach and back, possible shingles. Pt had not had any sleep for a week.    HISTORY OF PRESENT ILLNESS: This is a 50 y.o. female complaining of painful rash to left trunk area that started about 8 days ago. Had E visit last malleolus started on Valtrex for shingles.  Still having significant pain.  HPI   Prior to Admission medications   Medication Sig Start Date End Date Taking? Authorizing Provider  amLODipine (NORVASC) 10 MG tablet Take 1 tablet (10 mg total) by mouth daily. 06/22/20  Yes Walid Haig, Ines Bloomer, MD  APPLE CIDER VINEGAR PO Take by mouth.   Yes [provider]  Ascorbic Acid (VITAMIN C PO) Take by mouth.   Yes [provider]  Cyanocobalamin (B-12) 1000 MCG TABS Take 1 tablet by mouth daily.   Yes [provider]  cyclobenzaprine (FLEXERIL) 10 MG tablet Take 1 tablet (10 mg total) by mouth 3 (three) times daily as needed for muscle spasms. 01/20/21  Yes Mar Daring, PA-C  fexofenadine (ALLEGRA) 180 MG tablet Take 180 mg by mouth daily.   Yes [provider]  fluticasone (FLONASE) 50 MCG/ACT nasal spray Place into both nostrils daily.   Yes [provider]  Multiple Vitamin (MULTIVITAMIN) capsule Take 1 capsule by mouth daily.   Yes [provider]  naproxen (NAPROSYN) 500 MG tablet Take 1 tablet (500 mg total) by mouth 2 (two) times daily with a meal. 01/20/21  Yes Burnette, Clearnce Sorrel, PA-C  valACYclovir (VALTREX) 1000 MG tablet Take 1 tablet (1,000 mg total) by mouth 3 (three) times daily. 01/24/21  Yes Montine Circle, PA-C    No Known Allergies  Patient Active Problem List   Diagnosis Date Noted   Positive ANA (antinuclear antibody) 10/21/2020   Pain in left hip 10/21/2020   Pain in right knee 10/21/2020   Acne vulgaris 06/04/2019   Hyperpigmentation 06/04/2019   Essential hypertension 02/11/2019    Refusal of blood transfusions as patient is Jehovah's Witness 08/07/2018   Seasonal allergies 08/07/2018    Past Medical History:  Diagnosis Date   Allergic rhinitis    Allergy    Fibroid    Hypertension     Past Surgical History:  Procedure Laterality Date   ABDOMINAL HYSTERECTOMY     [facility]-2014   APPENDECTOMY  1989   COLONOSCOPY  2005   about 20 years ago in [place] exam   [place] in retina    Social History   Socioeconomic History   Marital status: Married    Spouse name: Not on file   Number of children: Not on file   Years of education: Not on file   Highest education level: Not on file  Occupational History   Not on file  Tobacco Use   Smoking status: Never   Smokeless tobacco: Never  Vaping Use   Vaping Use: Never used  Substance and Sexual Activity   Alcohol use: Yes    Alcohol/week: 3.0 standard drinks    Types: 3 Standard drinks or equivalent per week    Comment: social   Drug use: Never   Sexual activity: Yes    Birth control/protection: Surgical    Comment: 1st intercourse 50 yo-More than 5 partners  Other Topics Concern   Not on file  Social History Narrative   Not on file  Social Determinants of Health   Financial Resource Strain: Not on file  Food Insecurity: Not on file  Transportation Needs: Not on file  Physical Activity: Not on file  Stress: Not on file  Social Connections: Not on file  Intimate Partner Violence: Not on file    Family History  Problem Relation Age of Onset   Colon polyps Mother    Hypertension Mother    Colon cancer Mother 9   Colon polyps Father    Heart attack Father    Diabetes Mellitus I Father    Kidney failure Father    Thyroid cancer Sister    Fibromyalgia Sister    Kidney disease Brother    Esophageal cancer Neg Hx    Stomach cancer Neg Hx    Rectal cancer Neg Hx      Review of Systems  Constitutional: Negative.  Negative for chills and fever.  HENT: Negative.  Negative  for congestion and sore throat.   Respiratory: Negative.  Negative for cough and shortness of breath.   Cardiovascular: Negative.  Negative for chest pain and palpitations.  Gastrointestinal: Negative.  Negative for abdominal pain, diarrhea, nausea and vomiting.  Genitourinary: Negative.  Negative for dysuria and hematuria.  Musculoskeletal: Negative.   Skin:  Positive for itching and rash.  Neurological:  Negative for dizziness and headaches.  All other systems reviewed and are negative.   Physical Exam Vitals reviewed.  Constitutional:      Appearance: Normal appearance.     Comments: In obvious pain  HENT:     Head: Normocephalic.  Eyes:     Extraocular Movements: Extraocular movements intact.  Cardiovascular:     Rate and Rhythm: Normal rate.  Pulmonary:     Effort: Pulmonary effort is normal.  Musculoskeletal:        General: Normal range of motion.     Cervical back: Normal range of motion.  Skin:    General: Skin is warm and dry.     Findings: Rash (Vesicular rash with surrounding erythema on left T10-T11 dermatome distribution) present.  Neurological:     General: No focal deficit present.     Mental Status: She is alert and oriented to person, place, and time.     ASSESSMENT & PLAN: Problem List Items Addressed This Visit   None Visit Diagnoses     Herpes zoster without complication    -  Primary   Acute herpes zoster neuropathy       Relevant Medications   oxyCODONE-acetaminophen (PERCOCET/ROXICET) 5-325 MG tablet      Continue and finish Valtrex as prescribed.  Patient Instructions  Shingles Shingles is an infection. It gives you a painful skin rash and blisters that have fluid in them. Shingles is caused by the same germ (virus) that causes chickenpox. Shingles only happens in people who: Have had chickenpox. Have been given a shot (vaccine) to protect against chickenpox. Shingles is rare in this group. What are the causes? This condition is caused  by varicella-zoster virus. This is the same germ that causes chickenpox. After a person is exposed to the germ, the germ stays in the body but is not active (dormant). Shingles develops if the germ becomes active again (is reactivated). This can happen many years after the first exposure to the germ. It is not known what causes this germ to become active again. What increases the risk? People who have had chickenpox or received the chickenpox shot are at risk for shingles. This infection is  more common in people who: Are older than 49 years of age. Have a weakened disease-fighting system (immune system), such as people with: HIV (human immunodeficiency virus). AIDS (acquired immunodeficiency syndrome). Cancer. Are taking medicines that weaken the immune system, such as organ transplant medicines. Have a lot of stress. What are the signs or symptoms? The first symptoms of shingles may be itching, tingling, or pain in an area on your skin. A rash will show on your skin a few days or weeks later. This is what usually happens: The rash is likely to be on one side of your body. The rash usually has a shape like a belt or a band. Over time, the rash turns into fluid-filled blisters. The blisters will break open and change into scabs. The scabs usually dry up in about 2-3 weeks. You may also have: A fever. Chills. A headache. A feeling like you may vomit (nausea). How is this treated? The rash may last for several weeks. There is not a specific cure for this condition. Your doctor may prescribe medicines. Medicines may: Help with pain. Help you get better sooner. Help to prevent long-term problems. Help with itching (antihistamines). If the area involved is on your face, you may need to see a specialist. This may be an eye doctor or an ear, nose, and throat (ENT) doctor. Follow these instructions at home: Medicines Take over-the-counter and prescription medicines only as told by your  doctor. Put on an anti-itch cream or numbing cream where you have a rash, blisters, or scabs. Do this as told by your doctor. Helping with itching and discomfort  Put cold, wet cloths (cold compresses) on the area of the rash or blisters as told by your doctor. Cool baths can help you feel better. Try adding baking soda or dry oatmeal to the water to lessen itching. Do not bathe in hot water. Use calamine lotion as told by your doctor. Blister and rash care Keep your rash covered with a loose bandage (dressing). Wear loose clothing that does not rub on your rash. Wash your hands with soap and water for at least 20 seconds before and after you change your bandage. If you cannot use soap and water, use hand sanitizer. Change your bandage as told by your doctor. Keep your rash and blisters clean. To do this, wash the area with mild soap and cool water as told by your doctor. Check your rash every day for signs of infection. Check for: More redness, swelling, or pain. Fluid or blood. Warmth. Pus or a bad smell. Do not scratch your rash. Do not pick at your blisters. To help you to not scratch: Keep your fingernails clean and cut short. Wear gloves or mittens when you sleep, if scratching is a problem. General instructions Rest as told by your doctor. Wash your hands often with soap and water for at least 20 seconds. If you cannot use soap and water, use hand sanitizer. Doing this lowers your chance of getting a skin infection. Your infection can cause chickenpox in people who have never had chickenpox or never got a chickenpox vaccine shot. If you have blisters that did not change into scabs yet, try not to touch other people or be around other people, especially: Babies. Pregnant women. Children who have areas of red, itchy, or rough skin (eczema). Older people who have organ transplants. People who have a long-term (chronic) illness, like cancer or AIDS. Keep all follow-up visits. How is  this prevented? A vaccine shot  is the best way to prevent shingles and protect against shingles problems. If you have not had a vaccine shot, talk with your doctor about getting it. Where to find more information Centers for Disease Control and Prevention: http://www.wolf.info/ Contact a doctor if: Your pain does not get better with medicine. Your pain does not get better after the rash heals. You have any of these signs of infection around the rash: More redness, swelling, or pain. Fluid or blood. Warmth. Pus or a bad smell. You have a fever. Get help right away if: The rash is on your face or nose. You have pain in your face or pain by your eye. You lose feeling on one side of your face. You have trouble seeing. You have ear pain, or you have ringing in your ear. You have a loss of taste. Your condition gets worse. Summary Shingles gives you a painful skin rash and blisters that have fluid in them. Shingles is caused by the same germ (virus) that causes chickenpox. Keep your rash covered with a loose bandage. Wear loose clothing that does not rub on your rash. If you have blisters that did not change into scabs yet, try not to touch other people or be around people. This information is not intended to replace advice given to you by your health care provider. Make sure you discuss any questions you have with your health care provider. Document Revised: 01/11/2020 Document Reviewed: 01/11/2020 Elsevier Patient Education  2022 [place], [place] [facility]

## 2021-03-16 ENCOUNTER — Encounter: Payer: Self-pay | Admitting: Emergency Medicine

## 2021-03-17 NOTE — Telephone Encounter (Signed)
No interaction that I know of.  Thanks.

## 2021-04-24 ENCOUNTER — Other Ambulatory Visit (HOSPITAL_COMMUNITY): Payer: Self-pay

## 2021-04-24 MED ORDER — HYDROCODONE-ACETAMINOPHEN 5-325 MG PO TABS
1.0000 | ORAL_TABLET | ORAL | 0 refills | Status: DC
  Filled 2021-04-24: qty 12, 3d supply, fill #0

## 2021-06-01 ENCOUNTER — Other Ambulatory Visit: Payer: Self-pay | Admitting: Emergency Medicine

## 2021-06-01 ENCOUNTER — Other Ambulatory Visit (HOSPITAL_COMMUNITY): Payer: Self-pay

## 2021-06-01 DIAGNOSIS — Z1231 Encounter for screening mammogram for malignant neoplasm of breast: Secondary | ICD-10-CM

## 2021-06-01 MED ORDER — HYDROCODONE-ACETAMINOPHEN 5-325 MG PO TABS
1.0000 | ORAL_TABLET | ORAL | 0 refills | Status: DC | PRN
  Filled 2021-06-01: qty 12, 2d supply, fill #0

## 2021-06-14 ENCOUNTER — Ambulatory Visit
Admission: RE | Admit: 2021-06-14 | Discharge: 2021-06-14 | Disposition: A | Discharge status: Home / Self Care | Payer: No Typology Code available for payment source | Source: Ambulatory Visit

## 2021-06-14 DIAGNOSIS — Z1231 Encounter for screening mammogram for malignant neoplasm of breast: Secondary | ICD-10-CM

## 2021-06-21 ENCOUNTER — Ambulatory Visit (INDEPENDENT_AMBULATORY_CARE_PROVIDER_SITE_OTHER): Payer: No Typology Code available for payment source | Admitting: Emergency Medicine

## 2021-06-21 ENCOUNTER — Encounter: Payer: Self-pay | Admitting: Emergency Medicine

## 2021-06-21 VITALS — BP 132/80 | HR 79 | Temp 98.1°F | Ht 69.0 in | Wt 219.5 lb

## 2021-06-21 DIAGNOSIS — Z130 Encounter for screening for diseases of the blood and blood-forming organs and certain disorders involving the immune mechanism: Secondary | ICD-10-CM

## 2021-06-21 DIAGNOSIS — Z1329 Encounter for screening for other suspected endocrine disorder: Secondary | ICD-10-CM | POA: Diagnosis not present

## 2021-06-21 DIAGNOSIS — Z13228 Encounter for screening for other metabolic disorders: Secondary | ICD-10-CM

## 2021-06-21 DIAGNOSIS — I10 Essential (primary) hypertension: Secondary | ICD-10-CM

## 2021-06-21 DIAGNOSIS — Z0000 Encounter for general adult medical examination without abnormal findings: Secondary | ICD-10-CM

## 2021-06-21 DIAGNOSIS — Z13220 Encounter for screening for lipoid disorders: Secondary | ICD-10-CM | POA: Diagnosis not present

## 2021-06-21 LAB — COMPREHENSIVE METABOLIC PANEL
ALT: 18 U/L (ref 0–35)
AST: 18 U/L (ref 0–37)
Albumin: 4.3 g/dL (ref 3.5–5.2)
Alkaline Phosphatase: 66 U/L (ref 39–117)
BUN: 7 mg/dL (ref 6–23)
CO2: 32 mEq/L (ref 19–32)
Calcium: 8.9 mg/dL (ref 8.4–10.5)
Chloride: 102 mEq/L (ref 96–112)
Creatinine, Ser: 0.95 mg/dL (ref 0.40–1.20)
GFR: 70.25 mL/min (ref >60.00)
Glucose, Bld: 93 mg/dL (ref 70–99)
Potassium: 3.5 mEq/L (ref 3.5–5.1)
Sodium: 139 mEq/L (ref 135–145)
Total Bilirubin: 0.6 mg/dL (ref 0.2–1.2)
Total Protein: 7.6 g/dL (ref 6.0–8.3)

## 2021-06-21 LAB — CBC WITH DIFFERENTIAL/PLATELET
Basophils Absolute: 0 10*3/uL (ref 0.0–0.1)
Basophils Relative: 0.4 % (ref 0.0–3.0)
Eosinophils Absolute: 0.1 10*3/uL (ref 0.0–0.7)
Eosinophils Relative: 1.6 % (ref 0.0–5.0)
HCT: 37.4 % (ref 36.0–46.0)
Hemoglobin: 12.5 g/dL (ref 12.0–15.0)
Lymphocytes Relative: 31.4 % (ref 12.0–46.0)
Lymphs Abs: 2.6 10*3/uL (ref 0.7–4.0)
MCHC: 33.4 g/dL (ref 30.0–36.0)
MCV: 89.6 fl (ref 78.0–100.0)
Monocytes Absolute: 0.4 10*3/uL (ref 0.1–1.0)
Monocytes Relative: 4.9 % (ref 3.0–12.0)
Neutro Abs: 5 10*3/uL (ref 1.4–7.7)
Neutrophils Relative %: 61.7 % (ref 43.0–77.0)
Platelets: 291 10*3/uL (ref 150.0–400.0)
RBC: 4.17 Mil/uL (ref 3.87–5.11)
RDW: 13.1 % (ref 11.5–15.5)
WBC: 8.2 10*3/uL (ref 4.0–10.5)

## 2021-06-21 LAB — LIPID PANEL
Cholesterol: 171 mg/dL (ref 0–200)
HDL: 60.1 mg/dL (ref >39.00)
LDL Cholesterol: 97 mg/dL (ref 0–99)
NonHDL: 110.7
Total CHOL/HDL Ratio: 3
Triglycerides: 68 mg/dL (ref 0.0–149.0)
VLDL: 13.6 mg/dL (ref 0.0–40.0)

## 2021-06-21 LAB — HEMOGLOBIN A1C: Hgb A1c MFr Bld: 5.8 % (ref 4.6–6.5)

## 2021-06-21 NOTE — Progress Notes (Signed)
Deanna Cook 51 y.o.   Chief Complaint  Patient presents with   Annual Exam   stiffness    Stiffness, tingling in both hands in the am    Numbness    Outer thigh numb from knee to hip bone     HISTORY OF PRESENT ILLNESS: This is a 51 y.o. female here for annual exam.  HPI   Prior to Admission medications   Medication Sig Start Date End Date Taking? Authorizing Provider  amLODipine (NORVASC) 10 MG tablet Take 1 tablet (10 mg total) by mouth daily. 06/22/20  Yes Kerron Sedano, Ines Bloomer, MD  Cyanocobalamin (B-12) 1000 MCG TABS Take 1 tablet by mouth daily.   Yes [provider]  fexofenadine (ALLEGRA) 180 MG tablet Take 180 mg by mouth daily.   Yes [provider]  fluticasone (FLONASE) 50 MCG/ACT nasal spray Place into both nostrils daily.   Yes [provider]  Multiple Vitamin (MULTIVITAMIN) capsule Take 1 capsule by mouth daily.   Yes [provider]  APPLE CIDER VINEGAR PO Take by mouth. Patient not taking: Reported on 06/21/2021    [provider]  Ascorbic Acid (VITAMIN C PO) Take by mouth. Patient not taking: Reported on 06/21/2021    [provider]  cyclobenzaprine (FLEXERIL) 10 MG tablet Take 1 tablet (10 mg total) by mouth 3 (three) times daily as needed for muscle spasms. Patient not taking: Reported on 06/21/2021 01/20/21   Mar Daring, PA-C  HYDROcodone-acetaminophen (NORCO/VICODIN) 5-325 MG tablet Take 1 tablet by mouth every 4-6 hours as needed for pain Patient not taking: Reported on 06/21/2021 04/24/21     HYDROcodone-acetaminophen (NORCO/VICODIN) 5-325 MG tablet Take 1 tablet by mouth every 4 (four) to 6 (six) hours as needed for pain. Patient not taking: Reported on 06/21/2021 06/01/21     naproxen (NAPROSYN) 500 MG tablet Take 1 tablet (500 mg total) by mouth 2 (two) times daily with a meal. Patient not taking: Reported on 06/21/2021 01/20/21   Mar Daring, PA-C  valACYclovir (VALTREX) 1000 MG  tablet Take 1 tablet (1,000 mg total) by mouth 3 (three) times daily. Patient not taking: Reported on 06/21/2021 01/24/21   Montine Circle, PA-C    No Known Allergies  Patient Active Problem List   Diagnosis Date Noted   Positive ANA (antinuclear antibody) 10/21/2020   Pain in left hip 10/21/2020   Pain in right knee 10/21/2020   Acne vulgaris 06/04/2019   Hyperpigmentation 06/04/2019   Essential hypertension 02/11/2019   Refusal of blood transfusions as patient is Jehovah's Witness 08/07/2018   Seasonal allergies 08/07/2018    Past Medical History:  Diagnosis Date   Allergic rhinitis    Allergy    Fibroid    Hypertension     Past Surgical History:  Procedure Laterality Date   ABDOMINAL HYSTERECTOMY     [facility]-2014   APPENDECTOMY  1989   COLONOSCOPY  2005   about 20 years ago in [place] exam   [place] in retina    Social History   Socioeconomic History   Marital status: Married    Spouse name: Not on file   Number of children: Not on file   Years of education: Not on file   Highest education level: Not on file  Occupational History   Not on file  Tobacco Use   Smoking status: Never   Smokeless tobacco: Never  Vaping Use   Vaping Use: Never used  Substance and Sexual  Activity   Alcohol use: Yes    Alcohol/week: 3.0 standard drinks    Types: 3 Standard drinks or equivalent per week    Comment: social   Drug use: Never   Sexual activity: Yes    Birth control/protection: Surgical    Comment: 1st intercourse 51 yo-More than 5 partners  Other Topics Concern   Not on file  Social History Narrative   Not on file   Social Determinants of Health   Financial Resource Strain: Not on file  Food Insecurity: Not on file  Transportation Needs: Not on file  Physical Activity: Not on file  Stress: Not on file  Social Connections: Not on file  Intimate Partner Violence: Not on file    Family History  Problem Relation Age of Onset   Colon  polyps Mother    Hypertension Mother    Colon cancer Mother 14   Colon polyps Father    Heart attack Father    Diabetes Mellitus I Father    Kidney failure Father    Thyroid cancer Sister    Fibromyalgia Sister    Breast cancer Cousin    Kidney disease Brother    Breast cancer Maternal Aunt    Esophageal cancer Neg Hx    Stomach cancer Neg Hx    Rectal cancer Neg Hx      Review of Systems  Constitutional: Negative.  Negative for chills and fever.  HENT: Negative.  Negative for congestion and sore throat.   Respiratory: Negative.  Negative for cough and shortness of breath.   Cardiovascular: Negative.  Negative for chest pain and palpitations.  Gastrointestinal:  Negative for abdominal pain, diarrhea, nausea and vomiting.  Genitourinary: Negative.   Musculoskeletal:  Positive for back pain and joint pain.  Skin: Negative.  Negative for rash.  Neurological:  Positive for sensory change. Negative for dizziness, focal weakness, seizures, weakness and headaches.  All other systems reviewed and are negative. Today's Vitals   06/21/21 1406  BP: 132/80  Pulse: 79  Temp: 98.1 F (36.7 C)  TempSrc: Oral  SpO2: 98%  Weight: 219 lb 8 oz (99.6 kg)  Height: '5\' 9"'$  (1.753 m)   Body mass index is 32.41 kg/m.   Physical Exam Vitals reviewed.  Constitutional:      Appearance: Normal appearance.  HENT:     Head: Normocephalic.     Right Ear: Tympanic membrane, ear canal and external ear normal.     Left Ear: Tympanic membrane, ear canal and external ear normal.     Mouth/Throat:     Mouth: Mucous membranes are moist.     Pharynx: Oropharynx is clear.  Eyes:     Extraocular Movements: Extraocular movements intact.     Conjunctiva/sclera: Conjunctivae normal.     Pupils: Pupils are equal, round, and reactive to light.  Cardiovascular:     Rate and Rhythm: Normal rate and regular rhythm.     Pulses: Normal pulses.     Heart sounds: Normal heart sounds.  Pulmonary:     Effort:  Pulmonary effort is normal.     Breath sounds: Normal breath sounds.  Abdominal:     General: There is no distension.     Palpations: Abdomen is soft.     Tenderness: There is no abdominal tenderness.  Musculoskeletal:        General: Normal range of motion.     Cervical back: No tenderness.     Right lower leg: No edema.  Left lower leg: No edema.  Lymphadenopathy:     Cervical: No cervical adenopathy.  Skin:    General: Skin is warm and dry.     Capillary Refill: Capillary refill takes less than 2 seconds.  Neurological:     General: No focal deficit present.     Mental Status: She is alert and oriented to person, place, and time.  Psychiatric:        Mood and Affect: Mood normal.        Behavior: Behavior normal.     ASSESSMENT & PLAN: Problem List Items Addressed This Visit       Cardiovascular and Mediastinum   Essential hypertension   Other Visit Diagnoses     Routine general medical examination at a health care facility    -  Primary   Screening for deficiency anemia       Relevant Orders   CBC with Differential (Completed)   Screening for lipoid disorders       Relevant Orders   Lipid panel (Completed)   Screening for endocrine, metabolic and immunity disorder       Relevant Orders   Comprehensive metabolic panel (Completed)   Hemoglobin A1c (Completed)      Modifiable risk factors discussed with patient. Anticipatory guidance according to age provided. The following topics were also discussed: Social Determinants of Health Smoking.  Non-smoker Diet and nutrition and need to decrease amount of daily carbohydrate intake and daily calories Benefits of exercise Cancer screening and review of most recent mammogram and colonoscopy reports. Vaccinations Cardiovascular risk assessment, hypertension and treatment Mental health including depression and anxiety Fall and accident prevention  Patient Instructions  Health Maintenance, Female Adopting a  healthy lifestyle and getting preventive care are important in promoting health and wellness. Ask your health care provider about: The right schedule for you to have regular tests and exams. Things you can do on your own to prevent diseases and keep yourself healthy. What should I know about diet, weight, and exercise? Eat a healthy diet  Eat a diet that includes plenty of vegetables, fruits, low-fat dairy products, and lean protein. Do not eat a lot of foods that are high in solid fats, added sugars, or sodium. Maintain a healthy weight Body mass index (BMI) is used to identify weight problems. It estimates body fat based on height and weight. Your health care provider can help determine your BMI and help you achieve or maintain a healthy weight. Get regular exercise Get regular exercise. This is one of the most important things you can do for your health. Most adults should: Exercise for at least 150 minutes each week. The exercise should increase your heart rate and make you sweat (moderate-intensity exercise). Do strengthening exercises at least twice a week. This is in addition to the moderate-intensity exercise. Spend less time sitting. Even light physical activity can be beneficial. Watch cholesterol and blood lipids Have your blood tested for lipids and cholesterol at 50 years of age, then have this test every 5 years. Have your cholesterol levels checked more often if: Your lipid or cholesterol levels are high. You are older than 50 years of age. You are at high risk for heart disease. What should I know about cancer screening? Depending on your health history and family history, you may need to have cancer screening at various ages. This may include screening for: Breast cancer. Cervical cancer. Colorectal cancer. Skin cancer. Lung cancer. What should I know about heart disease, diabetes, and  high blood pressure? Blood pressure and heart disease High blood pressure causes  heart disease and increases the risk of stroke. This is more likely to develop in people who have high blood pressure readings or are overweight. Have your blood pressure checked: Every 3-5 years if you are 32-11 years of age. Every year if you are 68 years old or older. Diabetes Have regular diabetes screenings. This checks your fasting blood sugar level. Have the screening done: Once every three years after age 23 if you are at a normal weight and have a low risk for diabetes. More often and at a younger age if you are overweight or have a high risk for diabetes. What should I know about preventing infection? Hepatitis B If you have a higher risk for hepatitis B, you should be screened for this virus. Talk with your health care provider to find out if you are at risk for hepatitis B infection. Hepatitis C Testing is recommended for: Everyone born from [birth year] through 1964. Anyone with known risk factors for hepatitis C. Sexually transmitted infections (STIs) Get screened for STIs, including gonorrhea and chlamydia, if: You are sexually active and are younger than 50 years of age. You are older than 50 years of age and your health care provider tells you that you are at risk for this type of infection. Your sexual activity has changed since you were last screened, and you are at increased risk for chlamydia or gonorrhea. Ask your health care provider if you are at risk. Ask your health care provider about whether you are at high risk for HIV. Your health care provider may recommend a prescription medicine to help prevent HIV infection. If you choose to take medicine to prevent HIV, you should first get tested for HIV. You should then be tested every 3 months for as long as you are taking the medicine. Pregnancy If you are about to stop having your period (premenopausal) and you may become pregnant, seek counseling before you get pregnant. Take 400 to 800 micrograms (mcg) of folic acid every day  if you become pregnant. Ask for birth control (contraception) if you want to prevent pregnancy. Osteoporosis and menopause Osteoporosis is a disease in which the bones lose minerals and strength with aging. This can result in bone fractures. If you are 57 years old or older, or if you are at risk for osteoporosis and fractures, ask your health care provider if you should: Be screened for bone loss. Take a calcium or vitamin D supplement to lower your risk of fractures. Be given hormone replacement therapy (HRT) to treat symptoms of menopause. Follow these instructions at home: Alcohol use Do not drink alcohol if: Your health care provider tells you not to drink. You are pregnant, may be pregnant, or are planning to become pregnant. If you drink alcohol: Limit how much you have to: 0-1 drink a day. Know how much alcohol is in your drink. In the [place] one drink equals one 12 oz bottle of beer (355 mL), one 5 oz glass of wine (148 mL), or one 1 oz glass of hard liquor (44 mL). Lifestyle Do not use any products that contain nicotine or tobacco. These products include cigarettes, chewing tobacco, and vaping devices, such as e-cigarettes. If you need help quitting, ask your health care provider. Do not use street drugs. Do not share needles. Ask your health care provider for help if you need support or information about quitting drugs. General instructions Schedule regular health, dental,  and eye exams. Stay current with your vaccines. Tell your health care provider if: You often feel depressed. You have ever been abused or do not feel safe at home. Summary Adopting a healthy lifestyle and getting preventive care are important in promoting health and wellness. Follow your health care provider's instructions about healthy diet, exercising, and getting tested or screened for diseases. Follow your health care provider's instructions on monitoring your cholesterol and blood pressure. This  information is not intended to replace advice given to you by your health care provider. Make sure you discuss any questions you have with your health care provider. Document Revised: 06/06/2020 Document Reviewed: 06/06/2020 Elsevier Patient Education  [place], MD [facility]

## 2021-06-21 NOTE — Patient Instructions (Signed)

## 2021-08-21 ENCOUNTER — Other Ambulatory Visit: Payer: Self-pay | Admitting: Emergency Medicine

## 2021-08-21 ENCOUNTER — Other Ambulatory Visit (HOSPITAL_COMMUNITY): Payer: Self-pay

## 2021-08-21 DIAGNOSIS — I10 Essential (primary) hypertension: Secondary | ICD-10-CM

## 2021-08-21 MED ORDER — AMLODIPINE BESYLATE 10 MG PO TABS
10.0000 mg | ORAL_TABLET | Freq: Every day | ORAL | 3 refills | Status: DC
  Filled 2021-08-21: qty 90, 90d supply, fill #0
  Filled 2021-11-22 – 2021-12-04 (×2): qty 90, 90d supply, fill #1
  Filled 2022-04-16: qty 90, 90d supply, fill #2
  Filled 2022-07-13: qty 90, 90d supply, fill #3

## 2021-08-22 ENCOUNTER — Telehealth: Payer: Self-pay

## 2021-08-22 DIAGNOSIS — M5430 Sciatica, unspecified side: Secondary | ICD-10-CM

## 2021-08-22 NOTE — Telephone Encounter (Signed)
Referral to orthopedic placed

## 2021-08-22 NOTE — Telephone Encounter (Signed)
Okay to place orthopedic referral

## 2021-08-22 NOTE — Telephone Encounter (Signed)
Pt is calling in stating that her sciatic nerve pain is really bad again and would like another referral as the one from 2022 she never scheduled.  Please advise

## 2021-08-24 ENCOUNTER — Ambulatory Visit: Payer: Self-pay

## 2021-08-24 ENCOUNTER — Ambulatory Visit (INDEPENDENT_AMBULATORY_CARE_PROVIDER_SITE_OTHER): Payer: No Typology Code available for payment source | Admitting: Surgery

## 2021-08-24 DIAGNOSIS — M5442 Lumbago with sciatica, left side: Secondary | ICD-10-CM | POA: Diagnosis not present

## 2021-08-24 DIAGNOSIS — G8929 Other chronic pain: Secondary | ICD-10-CM

## 2021-08-24 DIAGNOSIS — M4726 Other spondylosis with radiculopathy, lumbar region: Secondary | ICD-10-CM

## 2021-08-24 NOTE — Progress Notes (Signed)
Office Visit Note   Patient: Deanna Cook           Date of Birth: [birth year]           MRN: [id number] Visit Date: 08/24/2021              Requested by: Horald Pollen, [place],  [place] [address] PCP: Horald Pollen, MD   Assessment & Plan: Visit Diagnoses:  1. Chronic left-sided low back pain with left-sided sciatica   2. Other spondylosis with radiculopathy, lumbar region     Plan: With patient's ongoing problems for at least a year that is failed conservative treatment I will order lumbar MRI to rule out HNP/stenosis.  Follow-up with Dr. Lorin Mercy in about 3 weeks for recheck and to discuss results.  Patient also did complain of areas of joint pain and advised her that with her history of documented positive ANA that she should follow back up with a rheumatologist to see if more lab work needs to be done.  Follow-Up Instructions: Return in about 3 weeks (around 09/14/2021) for WITH DR YATES TO REVIEW LUMBAR MRI .   Orders:  Orders Placed This Encounter  Procedures   XR Lumbar Spine 2-3 Views   MR Lumbar Spine w/o contrast   No orders of the defined types were placed in this encounter.     Procedures: No procedures performed   Clinical Data: No additional findings.   Subjective: Chief Complaint  Patient presents with   Lower Back - Pain    HPI 51 year old female returns to the office with complaints of low back pain and left lower extremity radiculopathy.  She was seen by Tawanna Cooler PA August 2022 for same complaint.  Patient was provided a Medrol dose pack taper and sent to formal PT.  States that she did not have any significant improvement with this.  She continues to have pain in her low back that radiates into the left buttock and down to her foot.  Left thigh is constantly numb.  After she completed PT that was previously ordered she never followed back up with Tawanna Cooler PA.  Review of systems No current  complaints of cardiopulmonary GI/GU issues    Objective: Vital Signs: There were no vitals taken for this visit.  Physical Exam HENT:     Head: Normocephalic and atraumatic.     Nose: Nose normal.  Eyes:     Extraocular Movements: Extraocular movements intact.  Musculoskeletal:     Comments: Gait is normal.  No lumbar paraspinal tenderness/spasm.  Negative logroll bilateral hips.  Positive left straight leg raise.  Positive left sciatic notch tenderness.  No focal motor deficits.  Neurological:     Mental Status: She is alert and oriented to person, place, and time.  Psychiatric:        Mood and Affect: Mood normal.     Ortho Exam  Specialty Comments:  No specialty comments available.  Imaging: No results found.   PMFS History: Patient Active Problem List   Diagnosis Date Noted   Positive ANA (antinuclear antibody) 10/21/2020   Pain in left hip 10/21/2020   Pain in right knee 10/21/2020   Acne vulgaris 06/04/2019   Hyperpigmentation 06/04/2019   Essential hypertension 02/11/2019   Refusal of blood transfusions as patient is Jehovah's Witness 08/07/2018   Seasonal allergies 08/07/2018   Past Medical History:  Diagnosis Date   Allergic rhinitis    Allergy    Fibroid  Hypertension     Family History  Problem Relation Age of Onset   Colon polyps Mother    Hypertension Mother    Colon cancer Mother 106   Colon polyps Father    Heart attack Father    Diabetes Mellitus I Father    Kidney failure Father    Thyroid cancer Sister    Fibromyalgia Sister    Breast cancer Cousin    Kidney disease Brother    Breast cancer Maternal Aunt    Esophageal cancer Neg Hx    Stomach cancer Neg Hx    Rectal cancer Neg Hx     Past Surgical History:  Procedure Laterality Date   ABDOMINAL HYSTERECTOMY     [facility]-2014   APPENDECTOMY  1989   COLONOSCOPY  2005   about 20 years ago in [place] exam   [place] in retina   Social History   Occupational  History   Not on file  Tobacco Use   Smoking status: Never   Smokeless tobacco: Never  Vaping Use   Vaping Use: Never used  Substance and Sexual Activity   Alcohol use: Yes    Alcohol/week: 3.0 standard drinks of alcohol    Types: 3 Standard drinks or equivalent per week    Comment: social   Drug use: Never   Sexual activity: Yes    Birth control/protection: Surgical    Comment: 1st intercourse 51 yo-More than 5 partners

## 2021-09-07 ENCOUNTER — Ambulatory Visit
Admission: RE | Admit: 2021-09-07 | Discharge: 2021-09-07 | Disposition: A | Discharge status: Home / Self Care | Payer: No Typology Code available for payment source | Source: Ambulatory Visit | Attending: Surgery | Admitting: Surgery

## 2021-09-07 DIAGNOSIS — M4726 Other spondylosis with radiculopathy, lumbar region: Secondary | ICD-10-CM

## 2021-09-07 DIAGNOSIS — G8929 Other chronic pain: Secondary | ICD-10-CM

## 2021-09-19 ENCOUNTER — Encounter: Payer: Self-pay | Admitting: Orthopaedic Surgery

## 2021-09-19 ENCOUNTER — Ambulatory Visit (INDEPENDENT_AMBULATORY_CARE_PROVIDER_SITE_OTHER): Payer: No Typology Code available for payment source | Admitting: Orthopaedic Surgery

## 2021-09-19 DIAGNOSIS — M7712 Lateral epicondylitis, left elbow: Secondary | ICD-10-CM | POA: Insufficient documentation

## 2021-09-19 DIAGNOSIS — M5126 Other intervertebral disc displacement, lumbar region: Secondary | ICD-10-CM | POA: Diagnosis not present

## 2021-09-19 NOTE — Progress Notes (Unsigned)
Office Visit Note   Patient: Deanna Cook           Date of Birth: [birth year]           MRN: [id number] Visit Date: 09/19/2021              Requested by: Horald Pollen, [place],  [place] [address] PCP: Horald Pollen, MD   Assessment & Plan: Visit Diagnoses:  1. Left tennis elbow   2. Protrusion of lumbar intervertebral disc     Plan: Left tennis elbow brace applied.  MRI scan reviewed again with copy report.  Patient has a desiccation L5-S1 small disc central disc protrusion without compression.  If she gets increased symptoms we can consider epidural injection.  If her elbow gets worse we can consider lateral epicondylar injection.  Tennis elbow brace applied.  Recheck 3 months.  Follow-Up Instructions: Return in about 3 months (around 12/20/2021).   Orders:  No orders of the defined types were placed in this encounter.  No orders of the defined types were placed in this encounter.     Procedures: No procedures performed   Clinical Data: No additional findings.   Subjective: Chief Complaint  Patient presents with   Lower Back - Pain, Follow-up    MRI lumbar review    HPI 51 year old female returns for MRI review.  She has also had increased lateral pain at her elbow but does not recall any grabbing squeezing.  Lumbar pains been present greater than a year.  She had some borderline elevation in ANA labs and has rheumatology follow-up pending.  Review of Systems all of systems are noncontributory to HPI.   Objective: Vital Signs: BP (!) 151/93   Pulse 67   Ht '5\' 9"'$  (1.753 m)   Wt 224 lb (101.6 kg)   BMI 33.08 kg/m   Physical Exam Constitutional:      Appearance: She is well-developed.  HENT:     Head: Normocephalic.     Right Ear: External ear normal.     Left Ear: External ear normal. There is no impacted cerumen.  Eyes:     Pupils: Pupils are equal, round, and reactive to light.  Neck:     Thyroid: No  thyromegaly.     Trachea: No tracheal deviation.  Cardiovascular:     Rate and Rhythm: Normal rate.  Pulmonary:     Effort: Pulmonary effort is normal.  Abdominal:     Palpations: Abdomen is soft.  Musculoskeletal:     Cervical back: No rigidity.  Skin:    General: Skin is warm and dry.  Neurological:     Mental Status: She is alert and oriented to person, place, and time.  Psychiatric:        Behavior: Behavior normal.     Ortho Exam patient is moderate to severe tenderness lateral epicondyle pain with resisted wrist extension.  Olecranon radial head is normal.  Medial epicondyle nontender full extension the elbow negative impingement the shoulder good cervical range of motion.  Negative straight leg raising 90 degrees.  Patient is able to heel and toe walk.  Specialty Comments:  No specialty comments available.  Imaging: Narrative & Impression  CLINICAL DATA:  Worsening low back pain and left lower extremity radiculopathy   EXAM: MRI LUMBAR SPINE WITHOUT CONTRAST   TECHNIQUE: Multiplanar, multisequence MR imaging of the lumbar spine was performed. No intravenous contrast was administered.   COMPARISON:  No prior MRI, correlation is  made with 08/24/2021 lumbar spine radiographs   FINDINGS: Segmentation:  Standard.   Alignment:  Levocurvature.  No significant listhesis.   Vertebrae:  No acute fracture or suspicious osseous lesion.   Conus medullaris and cauda equina: Conus extends to the L1 level. Conus and cauda equina appear normal.   Paraspinal and other soft tissues: Negative.   Disc levels:   T12-L1: No significant disc bulge. No spinal canal stenosis or neural foraminal narrowing.   L1-L2: No significant disc bulge. No spinal canal stenosis or neural foraminal narrowing.   L2-L3: No significant disc bulge. No spinal canal stenosis or neural foraminal narrowing.   L3-L4: No significant disc bulge. Mild facet arthropathy. No spinal canal stenosis or  neural foraminal narrowing.   L4-L5: No significant disc bulge. Mild facet arthropathy. No spinal canal stenosis or neural foraminal narrowing.   L5-S1: Disc desiccation and small central disc protrusion, which appears to contact the descending S1 nerve roots. Mild facet arthropathy. No spinal canal stenosis or neural foraminal narrowing.   IMPRESSION: Mild degenerative changes, most prominently at L5-S1 where a small central disc protrusion appears to contact the descending S1 nerve roots. No spinal canal stenosis or neural foraminal narrowing.     Electronically Signed   By: Merilyn Baba M.D.   On: 09/07/2021 20:47       PMFS History: Patient Active Problem List   Diagnosis Date Noted   Left tennis elbow 09/19/2021   Protrusion of lumbar intervertebral disc 09/19/2021   Positive ANA (antinuclear antibody) 10/21/2020   Pain in left hip 10/21/2020   Pain in right knee 10/21/2020   Acne vulgaris 06/04/2019   Hyperpigmentation 06/04/2019   Essential hypertension 02/11/2019   Refusal of blood transfusions as patient is Jehovah's Witness 08/07/2018   Seasonal allergies 08/07/2018   Past Medical History:  Diagnosis Date   Allergic rhinitis    Allergy    Fibroid    Hypertension     Family History  Problem Relation Age of Onset   Colon polyps Mother    Hypertension Mother    Colon cancer Mother 9   Colon polyps Father    Heart attack Father    Diabetes Mellitus I Father    Kidney failure Father    Thyroid cancer Sister    Fibromyalgia Sister    Breast cancer Cousin    Kidney disease Brother    Breast cancer Maternal Aunt    Esophageal cancer Neg Hx    Stomach cancer Neg Hx    Rectal cancer Neg Hx     Past Surgical History:  Procedure Laterality Date   ABDOMINAL HYSTERECTOMY     LAVH-2014   APPENDECTOMY  1989   COLONOSCOPY  2005   about 20 years ago in [place] exam   [place] in retina   Social History   Occupational History    Not on file  Tobacco Use   Smoking status: Never   Smokeless tobacco: Never  Vaping Use   Vaping Use: Never used  Substance and Sexual Activity   Alcohol use: Yes    Alcohol/week: 3.0 standard drinks of alcohol    Types: 3 Standard drinks or equivalent per week    Comment: social   Drug use: Never   Sexual activity: Yes    Birth control/protection: Surgical    Comment: 1st intercourse 51 yo-More than 5 partners

## 2021-10-05 NOTE — Progress Notes (Signed)
Office Visit Note  Patient: Deanna Cook             Date of Birth: May 08, 1970           MRN: [id number]             PCP: Georgina Quint, MD Referring: Georgina Quint, * Visit Date: 10/11/2021   Subjective:   History of Present Illness: Deanna Cook is a 51 y.o. female here for follow up for positive ANA with joint pains.  At our initial visit last year were seeing knee and hip joint problems most active but no definite inflammatory findings.  Since then she has had ongoing problem at several areas including worsening elbow pain shoulder pain back pain in hands most severely affected in the mornings.  She describes finger pain as 11 out of 10 in severity in the mornings this improves over the course of a few hours and then to 2 or 3 out of 10 in severity.  She has left elbow pain was suspected for tennis elbow and is currently using a brace for this with no significant benefit. Sometimes has pain at the shoulder but not radiating down the whole arm. She has low back pain intermittent radiating symptoms down the left leg. She also describes an area on the side and front of the thigh with persistent numbness. She does have dry eyes and mouth symptoms. No skin rashes no erythema at joints no raynaud's symptoms.   Previous HPI 10/21/2021 Deanna Cook is a 51 y.o. female here for positive ANA with right knee and left hip pain and decreased mobility.  She has been noticing problems intermittently since moving to this area around 2019.  However about 5 weeks ago she experienced an episode she felt like her left hip was out of position and unable to move properly alleviated with hitting it against the wall.  Since then she has had some more aching and feeling like the hip gets out of position or unable to move normally especially with certain movements requiring a lot of flexion and external rotation.  She also had an episode of 2 hours of pain radiating down the leg after rising from a  seated position.  Her right knee is also cause more trouble occasionally seeming to catch or lock part way through its range of movement.  She also describes pain that feels like it is inside the joint when climbing stairs. She recalls a few episodes of suddenly falling due to her joint giving way or she feels it is not engaging before these past few weeks.  She recalls falling while standing in a shower 5 or 6 years ago, fall onto her right knee climbing stairs about 4 years ago, falling forward while bending about 2 years ago. She saw Dr. Roda Shutters for this with trial of right knee steroid injection and oral prednisone and physical therapy for left hip pain.  She felt not much benefit with the steroid injection after about 3 days.  The oral steroids did not help much for hip pain either. Outside of joint pains she is also had worsened acne during the past 2 years has seen dermatology for this.  She describes increased fatigue and excessive daytime somnolence.  Otherwise she denies any hair loss, oral ulcers, lymphadenopathy, Raynaud's symptoms, chest pains, or history of blood clots.   Labs reviewed 08/2020 ANA 1:40 homogenous RF neg CCP neg ESR 19 CBC wnl CMP wnl   Imaging reviewed 09/21/20 Xray left  hip No acute or structural abnormalities   09/21/20 Xray right knee Mild degenerative changes to the medial and patellofemoral compartments   09/21/20 Xray lumbar spine Mild multilevel degenerative changes   Review of Systems  Constitutional:  Positive for fatigue.  HENT:  Positive for mouth sores and mouth dryness.   Eyes:  Positive for dryness.  Respiratory:  Negative for shortness of breath.   Cardiovascular:  Positive for palpitations. Negative for chest pain.  Gastrointestinal:  Negative for blood in stool, constipation and diarrhea.  Endocrine: Negative for increased urination.  Genitourinary:  Positive for involuntary urination.  Musculoskeletal:  Positive for joint pain, joint pain,  myalgias, morning stiffness, muscle tenderness and myalgias. Negative for gait problem, joint swelling and muscle weakness.  Skin:  Negative for color change, rash, hair loss and sensitivity to sunlight.  Allergic/Immunologic: Negative for susceptible to infections.  Neurological:  Negative for dizziness and headaches.  Hematological:  Negative for swollen glands.  Psychiatric/Behavioral:  Positive for sleep disturbance. Negative for depressed mood. The patient is not nervous/anxious.     PMFS History:  Patient Active Problem List   Diagnosis Date Noted   Left tennis elbow 09/19/2021   Protrusion of lumbar intervertebral disc 09/19/2021   Positive ANA (antinuclear antibody) 10/21/2020   Pain in left hip 10/21/2020   Pain in right knee 10/21/2020   Acne vulgaris 06/04/2019   Hyperpigmentation 06/04/2019   Essential hypertension 02/11/2019   Refusal of blood transfusions as patient is Jehovah's Witness 08/07/2018   Seasonal allergies 08/07/2018    Past Medical History:  Diagnosis Date   Allergic rhinitis    Allergy    Fibroid    Hypertension     Family History  Problem Relation Age of Onset   Colon polyps Mother    Hypertension Mother    Colon cancer Mother 19   Colon polyps Father    Heart attack Father    Diabetes Mellitus I Father    Kidney failure Father    Thyroid cancer Sister    Fibromyalgia Sister    Breast cancer Cousin    Kidney disease Brother    Breast cancer Maternal Aunt    Esophageal cancer Neg Hx    Stomach cancer Neg Hx    Rectal cancer Neg Hx    Past Surgical History:  Procedure Laterality Date   ABDOMINAL HYSTERECTOMY     LAVH-2014   APPENDECTOMY  1989   COLONOSCOPY  2005   about 20 years ago in [place] exam   [place] in retina   Social History   Social History Narrative   Not on file   Immunization History  Administered Date(s) Administered   Influenza,inj,Quad PF,6+ Mos 12/24/2018   Influenza-Unspecified  11/24/2019   PFIZER(Purple Top)SARS-COV-2 Vaccination 04/05/2019, 04/27/2019   Tdap 01/29/2013     Objective: Vital Signs: BP (!) 154/90 (BP Location: Right Arm, Patient Position: Sitting, Cuff Size: Normal)   Pulse 65   Resp 15   Ht $R'5\' 9"'nO$  (1.753 m)   Wt 226 lb 6.4 oz (102.7 kg)   BMI 33.43 kg/m    Physical Exam Cardiovascular:     Rate and Rhythm: Normal rate and regular rhythm.  Pulmonary:     Effort: Pulmonary effort is normal.     Breath sounds: Normal breath sounds.  Musculoskeletal:     Right lower leg: No edema.     Left lower leg: No edema.  Skin:    General: Skin is warm  and dry.     Findings: No rash.  Neurological:     Mental Status: She is alert.  Psychiatric:        Mood and Affect: Mood normal.      Musculoskeletal Exam:  Shoulders full ROM no tenderness or swelling Elbows full ROM no tenderness or swelling Wrists full ROM no tenderness or swelling Fingers full ROM no tenderness or swelling Left lateral hip and low back paraspinal tenderness to pressure Knees full ROM no tenderness or swelling Ankles full ROM no tenderness or swelling  Investigation: No additional findings.  Imaging: No results found.  Recent Labs: Lab Results  Component Value Date   WBC 8.2 06/21/2021   HGB 12.5 06/21/2021   PLT 291.0 06/21/2021   NA 139 06/21/2021   K 3.5 06/21/2021   CL 102 06/21/2021   CO2 32 06/21/2021   GLUCOSE 93 06/21/2021   BUN 7 06/21/2021   CREATININE 0.95 06/21/2021   BILITOT 0.6 06/21/2021   ALKPHOS 66 06/21/2021   AST 18 06/21/2021   ALT 18 06/21/2021   PROT 7.6 06/21/2021   ALBUMIN 4.3 06/21/2021   CALCIUM 8.9 06/21/2021   GFRAA >60 12/13/2018    Speciality Comments: No specialty comments available.  Procedures:  No procedures performed Allergies: Patient has no known allergies.   Assessment / Plan:     Visit Diagnoses: Positive ANA (antinuclear antibody) - Plan: ANA, Anti-Smith antibody, Sjogrens syndrome-A extractable nuclear  antibody, Sjogrens syndrome-B extractable nuclear antibody, Anti-DNA antibody, double-stranded, C3 and C4, RNP Antibody  Severe morning pain and stiffness in bilateral hands suggestive for inflammatory joint pain.  She did have a persistent very low positive ANA antibody last year.  We will recheck titer and pattern also checking specific antibody markers as detailed above and serum complements.  If completely negative may not recommend any new medication but higher pretest suspicion for disease state compared to at exam last year.  Pain in left hip  Hip pains in a very lateral distribution less typical for a sciatica versus more superficial structure.  The numb area of the thigh suspicious for meralgia paresthetica.  Chronic pain of right knee  Polyarthralgia - Plan: Sedimentation rate, C-reactive protein, 14-3-3 eta Protein  Joint pain in multiple areas no definite inflammation on exam.  We will recheck sedimentation rate and CRP for inflammatory markers.  Also check $RemoveBe'14 3 3 'waOiAsapr$ ETA protein.   Orders: Orders Placed This Encounter  Procedures   Sedimentation rate   C-reactive protein   14-3-3 eta Protein   ANA   Anti-Smith antibody   Sjogrens syndrome-A extractable nuclear antibody   Sjogrens syndrome-B extractable nuclear antibody   Anti-DNA antibody, double-stranded   C3 and C4   RNP Antibody   Anti-nuclear ab-titer (ANA titer)   No orders of the defined types were placed in this encounter.    Follow-Up Instructions: No follow-ups on file.   Collier Salina, MD  Note - This record has been created using [employer].  Chart creation errors have been sought, but may not always  have been located. Such creation errors do not reflect on  the standard of medical care.

## 2021-10-11 ENCOUNTER — Encounter: Payer: Self-pay | Admitting: Internal Medicine

## 2021-10-11 ENCOUNTER — Ambulatory Visit: Payer: No Typology Code available for payment source | Attending: Internal Medicine | Admitting: Internal Medicine

## 2021-10-11 VITALS — BP 154/90 | HR 65 | Resp 15 | Ht 69.0 in | Wt 226.4 lb

## 2021-10-11 DIAGNOSIS — R768 Other specified abnormal immunological findings in serum: Secondary | ICD-10-CM | POA: Diagnosis not present

## 2021-10-11 DIAGNOSIS — M25561 Pain in right knee: Secondary | ICD-10-CM | POA: Diagnosis not present

## 2021-10-11 DIAGNOSIS — M2550 Pain in unspecified joint: Secondary | ICD-10-CM | POA: Diagnosis not present

## 2021-10-11 DIAGNOSIS — M25552 Pain in left hip: Secondary | ICD-10-CM

## 2021-10-11 DIAGNOSIS — G8929 Other chronic pain: Secondary | ICD-10-CM

## 2021-10-19 LAB — ANTI-NUCLEAR AB-TITER (ANA TITER): ANA Titer 1: 1:80 {titer} — ABNORMAL HIGH

## 2021-10-19 LAB — C-REACTIVE PROTEIN: CRP: 9.1 mg/L — ABNORMAL HIGH (ref <8.0)

## 2021-10-19 LAB — ANTI-DNA ANTIBODY, DOUBLE-STRANDED: ds DNA Ab: 4 IU/mL

## 2021-10-19 LAB — RNP ANTIBODY: Ribonucleic Protein(ENA) Antibody, IgG: <1 AI

## 2021-10-19 LAB — C3 AND C4
C3 Complement: 150 mg/dL (ref 83–193)
C4 Complement: 32 mg/dL (ref 15–57)

## 2021-10-19 LAB — ANTI-SMITH ANTIBODY: ENA SM Ab Ser-aCnc: <1 AI

## 2021-10-19 LAB — ANA: Anti Nuclear Antibody (ANA): POSITIVE — AB

## 2021-10-19 LAB — SJOGRENS SYNDROME-A EXTRACTABLE NUCLEAR ANTIBODY: SSA (Ro) (ENA) Antibody, IgG: 6.9 AI — AB

## 2021-10-19 LAB — SEDIMENTATION RATE: Sed Rate: 9 mm/h (ref 0–20)

## 2021-10-19 LAB — SJOGRENS SYNDROME-B EXTRACTABLE NUCLEAR ANTIBODY: SSB (La) (ENA) Antibody, IgG: <1 AI

## 2021-10-19 LAB — 14-3-3 ETA PROTEIN: 14-3-3 eta Protein: <0.2 ng/mL (ref <0.2)

## 2021-10-20 ENCOUNTER — Encounter: Payer: Self-pay | Admitting: Internal Medicine

## 2021-10-20 NOTE — Progress Notes (Signed)
Lab findings are mostly negative but does have a moderately positive SSA antibody titer along with the persistent positive ANA.  Her mild CRP elevation may be insignificant.  Considering all of her symptoms this is suspicious for Sjogren's syndrome.  She is strongly trying to avoid starting additional long-term medications so does not want to make follow-up plans to talk about drugs at this time.  I will send her additional information via MyChart to review about the problem and things to watch out for so we can follow-up as needed.

## 2021-11-02 ENCOUNTER — Telehealth (INDEPENDENT_AMBULATORY_CARE_PROVIDER_SITE_OTHER): Payer: No Typology Code available for payment source | Admitting: Family Medicine

## 2021-11-02 ENCOUNTER — Encounter: Payer: Self-pay | Admitting: Family Medicine

## 2021-11-02 ENCOUNTER — Other Ambulatory Visit (HOSPITAL_COMMUNITY): Payer: Self-pay

## 2021-11-02 DIAGNOSIS — R051 Acute cough: Secondary | ICD-10-CM

## 2021-11-02 DIAGNOSIS — R0982 Postnasal drip: Secondary | ICD-10-CM | POA: Diagnosis not present

## 2021-11-02 MED ORDER — BENZONATATE 200 MG PO CAPS
200.0000 mg | ORAL_CAPSULE | Freq: Two times a day (BID) | ORAL | 0 refills | Status: DC | PRN
  Filled 2021-11-02: qty 20, 10d supply, fill #0

## 2021-11-02 MED ORDER — AMOXICILLIN 875 MG PO TABS
875.0000 mg | ORAL_TABLET | Freq: Two times a day (BID) | ORAL | 0 refills | Status: AC
  Filled 2021-11-02: qty 20, 10d supply, fill #0

## 2021-11-02 NOTE — Progress Notes (Signed)
MyChart Video Visit    Virtual Visit via Video Note   This visit type was conducted due to national recommendations for restrictions regarding the COVID-19 Pandemic (e.g. social distancing) in an effort to limit this patient's exposure and mitigate transmission in our community. This patient is at least at moderate risk for complications without adequate follow up. This format is felt to be most appropriate for this patient at this time. Physical exam was limited by quality of the video and audio technology used for the visit. CMA was able to get the patient set up on a video visit.  Patient location: Home. Patient and provider in visit Provider location: Office  I discussed the limitations of evaluation and management by telemedicine and the availability of in person appointments. The patient expressed understanding and agreed to proceed.  Visit Date: 11/02/2021  Today's healthcare provider: Harland Dingwall, NP-C     Subjective:    Patient ID: Deanna Cook, female    DOB: [birth year], 51 y.o.   MRN: [id number]  No chief complaint on file.   HPI  Complains of a 2 day hx of post nasal drainage, sore throat, cough, chest burning, glands are swollen.   Taking Allegra D.   Flying to [employer].   No fever, chills, dizziness, shortness of breath, N/v/d  Negative covid test.     Past Medical History:  Diagnosis Date   Allergic rhinitis    Allergy    Fibroid    Hypertension     Past Surgical History:  Procedure Laterality Date   ABDOMINAL HYSTERECTOMY     LAVH-2014   APPENDECTOMY  1989   COLONOSCOPY  2005   about 20 years ago in [place] exam   [place] in retina    Family History  Problem Relation Age of Onset   Colon polyps Mother    Hypertension Mother    Colon cancer Mother 79   Colon polyps Father    Heart attack Father    Diabetes Mellitus I Father    Kidney failure Father    Thyroid cancer Sister    Fibromyalgia Sister     Breast cancer Cousin    Kidney disease Brother    Breast cancer Maternal Aunt    Esophageal cancer Neg Hx    Stomach cancer Neg Hx    Rectal cancer Neg Hx     Social History   Socioeconomic History   Marital status: Married    Spouse name: Not on file   Number of children: Not on file   Years of education: Not on file   Highest education level: Not on file  Occupational History   Not on file  Tobacco Use   Smoking status: Never    Passive exposure: Never   Smokeless tobacco: Never  Vaping Use   Vaping Use: Never used  Substance and Sexual Activity   Alcohol use: Yes    Alcohol/week: 3.0 standard drinks of alcohol    Types: 3 Standard drinks or equivalent per week    Comment: social   Drug use: Never   Sexual activity: Yes    Birth control/protection: Surgical    Comment: 1st intercourse 51 yo-More than 5 partners  Other Topics Concern   Not on file  Social History Narrative   Not on file   Social Determinants of Health   Financial Resource Strain: Not on file  Food Insecurity: Not on file  Transportation Needs: Not on file  Physical Activity: Not on file  Stress: Not on file  Social Connections: Not on file  Intimate Partner Violence: Not on file    Outpatient Medications Prior to Visit  Medication Sig Dispense Refill   amLODipine (NORVASC) 10 MG tablet Take 1 tablet (10 mg total) by mouth daily. (Patient taking differently: Take 10 mg by mouth every other day.) 90 tablet 3   Cyanocobalamin (B-12) 1000 MCG TABS Take 1 tablet by mouth as needed.     fexofenadine (ALLEGRA) 180 MG tablet Take 180 mg by mouth as needed.     fluticasone (FLONASE) 50 MCG/ACT nasal spray Place into both nostrils as needed.     Multiple Vitamin (MULTIVITAMIN) capsule Take 1 capsule by mouth as needed.     No facility-administered medications prior to visit.    No Known Allergies  ROS     Objective:    Physical Exam  There were no vitals taken for this visit. Wt Readings  from Last 3 Encounters:  10/11/21 226 lb 6.4 oz (102.7 kg)  09/19/21 224 lb (101.6 kg)  06/21/21 219 lb 8 oz (99.6 kg)   Alert and oriented and in no acute distress.  Respirations unlabored.  Speaking in complete sentences without difficulty.  Congested cough noted.     Assessment & Plan:   Problem List Items Addressed This Visit   None Visit Diagnoses     Acute cough    -  Primary   Relevant Medications   amoxicillin (AMOXIL) 875 MG tablet   benzonatate (TESSALON) 200 MG capsule   Post-nasal drainage          Discussed that she most likely has a viral illness at this point.  Recommend another COVID test at home today.  Discussed symptomatic management in depth.  Tessalon Perles prescribed.  She is going out of town until mid next week so I sent in a prescription for amoxicillin in case she is worsening or not improving by Sunday.  Follow-up as needed.  I am having Loletta Parish start on amoxicillin and benzonatate. I am also having her maintain her fexofenadine, fluticasone, B-12, multivitamin, and amLODipine.  Meds ordered this encounter  Medications   amoxicillin (AMOXIL) 875 MG tablet    Sig: Take 1 tablet (875 mg total) by mouth 2 (two) times daily for 10 days.    Dispense:  20 tablet    Refill:  0    Order Specific Question:   Supervising Provider    Answer:   Pricilla Holm A [[id number]]   benzonatate (TESSALON) 200 MG capsule    Sig: Take 1 capsule (200 mg total) by mouth 2 (two) times daily as needed for cough.    Dispense:  20 capsule    Refill:  0    Order Specific Question:   Supervising Provider    Answer:   Pricilla Holm A [[id number]]    I discussed the assessment and treatment plan with the patient. The patient was provided an opportunity to ask questions and all were answered. The patient agreed with the plan and demonstrated an understanding of the instructions.   The patient was advised to call back or seek an in-person evaluation if the symptoms worsen  or if the condition fails to improve as anticipated.  I provided 17 minutes of face-to-face time during this encounter.   Harland Dingwall, NP-C [facility] [place] [phone] (phone) [phone] (fax)  [place]

## 2021-11-22 ENCOUNTER — Other Ambulatory Visit (HOSPITAL_COMMUNITY): Payer: Self-pay

## 2021-12-01 ENCOUNTER — Other Ambulatory Visit (HOSPITAL_COMMUNITY): Payer: Self-pay

## 2021-12-04 ENCOUNTER — Other Ambulatory Visit (HOSPITAL_COMMUNITY): Payer: Self-pay

## 2021-12-11 ENCOUNTER — Other Ambulatory Visit (HOSPITAL_COMMUNITY): Payer: Self-pay

## 2022-01-08 ENCOUNTER — Other Ambulatory Visit (HOSPITAL_COMMUNITY): Payer: Self-pay

## 2022-01-08 ENCOUNTER — Encounter: Payer: Self-pay | Admitting: Emergency Medicine

## 2022-01-08 ENCOUNTER — Other Ambulatory Visit: Payer: Self-pay | Admitting: Emergency Medicine

## 2022-01-08 ENCOUNTER — Ambulatory Visit (INDEPENDENT_AMBULATORY_CARE_PROVIDER_SITE_OTHER): Payer: No Typology Code available for payment source | Admitting: Emergency Medicine

## 2022-01-08 VITALS — BP 154/88 | HR 67 | Temp 98.3°F | Ht 69.0 in | Wt 223.2 lb

## 2022-01-08 DIAGNOSIS — M3500 Sicca syndrome, unspecified: Secondary | ICD-10-CM

## 2022-01-08 DIAGNOSIS — E559 Vitamin D deficiency, unspecified: Secondary | ICD-10-CM

## 2022-01-08 DIAGNOSIS — F5104 Psychophysiologic insomnia: Secondary | ICD-10-CM | POA: Diagnosis not present

## 2022-01-08 DIAGNOSIS — M25561 Pain in right knee: Secondary | ICD-10-CM

## 2022-01-08 DIAGNOSIS — I10 Essential (primary) hypertension: Secondary | ICD-10-CM

## 2022-01-08 DIAGNOSIS — M79672 Pain in left foot: Secondary | ICD-10-CM

## 2022-01-08 DIAGNOSIS — G8929 Other chronic pain: Secondary | ICD-10-CM

## 2022-01-08 DIAGNOSIS — R29818 Other symptoms and signs involving the nervous system: Secondary | ICD-10-CM | POA: Insufficient documentation

## 2022-01-08 DIAGNOSIS — R5383 Other fatigue: Secondary | ICD-10-CM | POA: Insufficient documentation

## 2022-01-08 LAB — CBC WITH DIFFERENTIAL/PLATELET
Basophils Absolute: 0 10*3/uL (ref 0.0–0.1)
Basophils Relative: 0.7 % (ref 0.0–3.0)
Eosinophils Absolute: 0.1 10*3/uL (ref 0.0–0.7)
Eosinophils Relative: 1.9 % (ref 0.0–5.0)
HCT: 40.1 % (ref 36.0–46.0)
Hemoglobin: 13.8 g/dL (ref 12.0–15.0)
Lymphocytes Relative: 32.8 % (ref 12.0–46.0)
Lymphs Abs: 2.2 10*3/uL (ref 0.7–4.0)
MCHC: 34.5 g/dL (ref 30.0–36.0)
MCV: 89.5 fl (ref 78.0–100.0)
Monocytes Absolute: 0.3 10*3/uL (ref 0.1–1.0)
Monocytes Relative: 4.3 % (ref 3.0–12.0)
Neutro Abs: 4.1 10*3/uL (ref 1.4–7.7)
Neutrophils Relative %: 60.3 % (ref 43.0–77.0)
Platelets: 326 10*3/uL (ref 150.0–400.0)
RBC: 4.48 Mil/uL (ref 3.87–5.11)
RDW: 13.5 % (ref 11.5–15.5)
WBC: 6.8 10*3/uL (ref 4.0–10.5)

## 2022-01-08 LAB — HEMOGLOBIN A1C: Hgb A1c MFr Bld: 5.9 % (ref 4.6–6.5)

## 2022-01-08 LAB — COMPREHENSIVE METABOLIC PANEL
ALT: 17 U/L (ref 0–35)
AST: 16 U/L (ref 0–37)
Albumin: 4.4 g/dL (ref 3.5–5.2)
Alkaline Phosphatase: 62 U/L (ref 39–117)
BUN: 9 mg/dL (ref 6–23)
CO2: 29 mEq/L (ref 19–32)
Calcium: 9.1 mg/dL (ref 8.4–10.5)
Chloride: 102 mEq/L (ref 96–112)
Creatinine, Ser: 0.95 mg/dL (ref 0.40–1.20)
GFR: 69.97 mL/min (ref >60.00)
Glucose, Bld: 102 mg/dL — ABNORMAL HIGH (ref 70–99)
Potassium: 3.8 mEq/L (ref 3.5–5.1)
Sodium: 139 mEq/L (ref 135–145)
Total Bilirubin: 0.6 mg/dL (ref 0.2–1.2)
Total Protein: 7.8 g/dL (ref 6.0–8.3)

## 2022-01-08 LAB — VITAMIN D 25 HYDROXY (VIT D DEFICIENCY, FRACTURES): VITD: 15.07 ng/mL — ABNORMAL LOW (ref 30.00–100.00)

## 2022-01-08 LAB — LIPID PANEL
Cholesterol: 170 mg/dL (ref 0–200)
HDL: 63.7 mg/dL (ref >39.00)
LDL Cholesterol: 96 mg/dL (ref 0–99)
NonHDL: 106.08
Total CHOL/HDL Ratio: 3
Triglycerides: 48 mg/dL (ref 0.0–149.0)
VLDL: 9.6 mg/dL (ref 0.0–40.0)

## 2022-01-08 LAB — VITAMIN B12: Vitamin B-12: 236 pg/mL (ref 211–911)

## 2022-01-08 MED ORDER — VITAMIN D (ERGOCALCIFEROL) 1.25 MG (50000 UNIT) PO CAPS
50000.0000 [IU] | ORAL_CAPSULE | ORAL | 1 refills | Status: DC
  Filled 2022-01-08: qty 7, 49d supply, fill #0
  Filled 2022-02-21: qty 7, 49d supply, fill #1

## 2022-01-08 MED ORDER — ZOLPIDEM TARTRATE 10 MG PO TABS
10.0000 mg | ORAL_TABLET | Freq: Every evening | ORAL | 1 refills | Status: DC | PRN
  Filled 2022-01-08: qty 15, 15d supply, fill #0
  Filled 2022-04-16: qty 15, 15d supply, fill #1

## 2022-01-08 NOTE — Assessment & Plan Note (Signed)
Chronic and stable. May need orthopedic evaluation in the future Unremarkable recent x-rays May need MRI.

## 2022-01-08 NOTE — Assessment & Plan Note (Signed)
Associated with chronic insomnia. Wakes up tired, daytime somnolence, exhausted all the time Obstructive sleep apnea needs to be ruled out Referral to sleep studies placed today.

## 2022-01-08 NOTE — Assessment & Plan Note (Signed)
Well-controlled hypertension. Continue amlodipine 10 mg daily. Cardiovascular risk associated with hypertension discussed. Diet and nutrition discussed.

## 2022-01-08 NOTE — Assessment & Plan Note (Addendum)
Stable.  Recently seen by rheumatologist Dr. Benjamine Mola.  Office visit notes reviewed.  No specific treatment at present time. Chronic intermittent swelling of lymph nodes may be related to this condition.  No palpable lymph nodes at present time Assessment / Plan:     Visit Diagnoses: Positive ANA (antinuclear antibody)   Low positive ANA titer and no specific clinical inflammatory findings on exam and history today. Very low suspicion for any autoimmune condition, discussed with patient and recommend no further serology workup at this time. Recommend continued f/u with therapy and orthopedics at this time.   Pain in left hip   She is continuing with the Pt referral, so far lack of response to glucocorticoids does not seem typical for any inflammatory process.   Chronic pain of right knee   Appears to have some patellofemoral pain syndrome with the anterior pain with climbing but also instability that is not typical for this.

## 2022-01-08 NOTE — Assessment & Plan Note (Signed)
Affecting quality of life and daily performance at work. Sleep apnea needs to be ruled out. Over-the-counter medications unsuccessful. In the meantime, we will start Ambien 10 mg at bedtime. Sleep hygiene discussed with patient.

## 2022-01-08 NOTE — Assessment & Plan Note (Signed)
Differential diagnosis discussed. Blood work done today. Multifactorial. Diet and nutrition discussed. Chronic insomnia contributing a great deal.

## 2022-01-08 NOTE — Patient Instructions (Signed)

## 2022-01-08 NOTE — Assessment & Plan Note (Signed)
Needs podiatry evaluation. Referral placed today.

## 2022-01-08 NOTE — Progress Notes (Signed)
Deanna Cook 51 y.o.   Chief Complaint  Patient presents with   Follow-up    Not been able to sleep well, have a nodes that keeps coming and going     HISTORY OF PRESENT ILLNESS: This is a 51 y.o. female complaining of several things: 1.  Chronic insomnia.  Getting worse and affecting quality of life.  Interfering with day-to-day activities.  Daytime somnolence.  Wakes up tired.  Snores. 2.  Chronic exhaustion 3.  History of Sjogren's syndrome.  Recently seen by rheumatologist Dr. Benjamine Mola.  Office notes reviewed.  Normal colonoscopy in 2022 4.  Intermittent inflammation of lymph nodes to right clavicular area and right leg.  Not present now 5.  Chronic pain to right knee and left foot.  Requesting podiatry referral. 6.  History of hypertension  HPI   Prior to Admission medications   Medication Sig Start Date End Date Taking? Authorizing Provider  amLODipine (NORVASC) 10 MG tablet Take 1 tablet (10 mg total) by mouth daily. Patient taking differently: Take 10 mg by mouth every other day. 08/21/21  Yes Adamariz Gillott, Ines Bloomer, MD  benzonatate (TESSALON) 200 MG capsule Take 1 capsule (200 mg total) by mouth 2 (two) times daily as needed for cough. 11/02/21  Yes Henson, Vickie L, NP-C  Cyanocobalamin (B-12) 1000 MCG TABS Take 1 tablet by mouth as needed.   Yes [provider]  fexofenadine (ALLEGRA) 180 MG tablet Take 180 mg by mouth as needed.   Yes [provider]  fluticasone (FLONASE) 50 MCG/ACT nasal spray Place into both nostrils as needed.   Yes [provider]  Multiple Vitamin (MULTIVITAMIN) capsule Take 1 capsule by mouth as needed.   Yes [provider]  zolpidem (AMBIEN) 10 MG tablet Take 1 tablet (10 mg total) by mouth at bedtime as needed for sleep. 01/08/22  Yes Horald Pollen, MD    No Known Allergies  Patient Active Problem List   Diagnosis Date Noted   Protrusion of lumbar intervertebral disc 09/19/2021   Positive ANA  (antinuclear antibody) 10/21/2020   Essential hypertension 02/11/2019   Refusal of blood transfusions as patient is Jehovah's Witness 08/07/2018   Seasonal allergies 08/07/2018    Past Medical History:  Diagnosis Date   Allergic rhinitis    Allergy    Fibroid    Hypertension     Past Surgical History:  Procedure Laterality Date   ABDOMINAL HYSTERECTOMY     [facility]-2014   APPENDECTOMY  1989   COLONOSCOPY  2005   about 20 years ago in [place] exam   [place] in retina    Social History   Socioeconomic History   Marital status: Married    Spouse name: Not on file   Number of children: Not on file   Years of education: Not on file   Highest education level: Not on file  Occupational History   Not on file  Tobacco Use   Smoking status: Never    Passive exposure: Never   Smokeless tobacco: Never  Vaping Use   Vaping Use: Never used  Substance and Sexual Activity   Alcohol use: Yes    Alcohol/week: 3.0 standard drinks of alcohol    Types: 3 Standard drinks or equivalent per week    Comment: social   Drug use: Never   Sexual activity: Yes    Birth control/protection: Surgical    Comment: 1st intercourse 51 yo-More than 5 partners  Other Topics Concern  Not on file  Social History Narrative   Not on file   Social Determinants of Health   Financial Resource Strain: Not on file  Food Insecurity: Not on file  Transportation Needs: Not on file  Physical Activity: Not on file  Stress: Not on file  Social Connections: Not on file  Intimate Partner Violence: Not on file    Family History  Problem Relation Age of Onset   Colon polyps Mother    Hypertension Mother    Colon cancer Mother 89   Colon polyps Father    Heart attack Father    Diabetes Mellitus I Father    Kidney failure Father    Thyroid cancer Sister    Fibromyalgia Sister    Breast cancer Cousin    Kidney disease Brother    Breast cancer Maternal Aunt    Esophageal cancer  Neg Hx    Stomach cancer Neg Hx    Rectal cancer Neg Hx      Review of Systems  Constitutional:  Positive for malaise/fatigue. Negative for chills and fever.  HENT: Negative.  Negative for congestion and sore throat.   Respiratory: Negative.  Negative for cough and shortness of breath.   Cardiovascular: Negative.  Negative for chest pain and palpitations.  Gastrointestinal:  Negative for abdominal pain, nausea and vomiting.  Genitourinary: Negative.   Musculoskeletal:  Positive for joint pain (Chronic right knee pain).       Left foot pain  Skin: Negative.  Negative for rash.       Occasional swelling of lymph nodes to right clavicle and right leg  Neurological: Negative.  Negative for dizziness and headaches.  All other systems reviewed and are negative.  Today's Vitals   01/08/22 0812  BP: (!) 154/88  Pulse: 67  Temp: 98.3 F (36.8 C)  TempSrc: Oral  SpO2: 95%  Weight: 223 lb 4 oz (101.3 kg)  Height: '5\' 9"'$  (1.753 m)   Body mass index is 32.97 kg/m.   Physical Exam Vitals reviewed.  Constitutional:      Appearance: Normal appearance.  HENT:     Head: Normocephalic.  Eyes:     Extraocular Movements: Extraocular movements intact.     Pupils: Pupils are equal, round, and reactive to light.  Cardiovascular:     Rate and Rhythm: Normal rate and regular rhythm.     Pulses: Normal pulses.     Heart sounds: Normal heart sounds.  Pulmonary:     Effort: Pulmonary effort is normal.     Breath sounds: Normal breath sounds.  Abdominal:     Palpations: Abdomen is soft.     Tenderness: There is no abdominal tenderness.  Musculoskeletal:     Cervical back: No tenderness.  Lymphadenopathy:     Head:     Right side of head: No submental or submandibular adenopathy.     Left side of head: No submental or submandibular adenopathy.     Cervical: No cervical adenopathy.     Right cervical: No superficial cervical adenopathy.    Left cervical: No superficial cervical  adenopathy.     Upper Body:     Right upper body: No supraclavicular adenopathy.     Left upper body: No supraclavicular adenopathy.  Skin:    General: Skin is warm and dry.  Neurological:     General: No focal deficit present.     Mental Status: She is alert and oriented to person, place, and time.  Psychiatric:  Mood and Affect: Mood normal.        Behavior: Behavior normal.     ASSESSMENT & PLAN: A total of 47 minutes was spent with the patient and counseling/coordination of care regarding preparing for this visit, review of most recent office visit notes, review of multiple chronic medical problems and their management, review of all medications, education on nutrition, review of most recent blood work results, prognosis, documentation, and need for follow-up.  Problem List Items Addressed This Visit       Cardiovascular and Mediastinum   Essential hypertension - Primary    Well-controlled hypertension. Continue amlodipine 10 mg daily. Cardiovascular risk associated with hypertension discussed. Diet and nutrition discussed.        Other   Chronic pain of right knee    Chronic and stable. May need orthopedic evaluation in the future Unremarkable recent x-rays May need MRI.      Chronic insomnia    Affecting quality of life and daily performance at work. Sleep apnea needs to be ruled out. Over-the-counter medications unsuccessful. In the meantime, we will start Ambien 10 mg at bedtime. Sleep hygiene discussed with patient.      Relevant Medications   zolpidem (AMBIEN) 10 MG tablet   Exhaustion    Differential diagnosis discussed. Blood work done today. Multifactorial. Diet and nutrition discussed. Chronic insomnia contributing a great deal.      Relevant Orders   Vitamin B12 (Completed)   VITAMIN D 25 Hydroxy (Vit-D Deficiency, Fractures) (Completed)   Hemoglobin A1c (Completed)   Lipid panel (Completed)   CBC with Differential/Platelet (Completed)    Comprehensive metabolic panel (Completed)   Sjogren's syndrome (HCC)    Stable.  Recently seen by rheumatologist Dr. Benjamine Mola.  Office visit notes reviewed.  No specific treatment at present time. Chronic intermittent swelling of lymph nodes may be related to this condition.  No palpable lymph nodes at present time Assessment / Plan:     Visit Diagnoses: Positive ANA (antinuclear antibody)   Low positive ANA titer and no specific clinical inflammatory findings on exam and history today. Very low suspicion for any autoimmune condition, discussed with patient and recommend no further serology workup at this time. Recommend continued f/u with therapy and orthopedics at this time.   Pain in left hip   She is continuing with the Pt referral, so far lack of response to glucocorticoids does not seem typical for any inflammatory process.   Chronic pain of right knee   Appears to have some patellofemoral pain syndrome with the anterior pain with climbing but also instability that is not typical for this.        Chronic foot pain, left    Needs podiatry evaluation. Referral placed today.      Relevant Orders   Ambulatory referral to Podiatry   Suspected sleep apnea    Associated with chronic insomnia. Wakes up tired, daytime somnolence, exhausted all the time Obstructive sleep apnea needs to be ruled out Referral to sleep studies placed today.      Relevant Orders   Ambulatory referral to Sleep Studies   Patient Instructions  Health Maintenance, Female Adopting a healthy lifestyle and getting preventive care are important in promoting health and wellness. Ask your health care provider about: The right schedule for you to have regular tests and exams. Things you can do on your own to prevent diseases and keep yourself healthy. What should I know about diet, weight, and exercise? Eat a healthy diet  Eat  a diet that includes plenty of vegetables, fruits, low-fat dairy products, and lean  protein. Do not eat a lot of foods that are high in solid fats, added sugars, or sodium. Maintain a healthy weight Body mass index (BMI) is used to identify weight problems. It estimates body fat based on height and weight. Your health care provider can help determine your BMI and help you achieve or maintain a healthy weight. Get regular exercise Get regular exercise. This is one of the most important things you can do for your health. Most adults should: Exercise for at least 150 minutes each week. The exercise should increase your heart rate and make you sweat (moderate-intensity exercise). Do strengthening exercises at least twice a week. This is in addition to the moderate-intensity exercise. Spend less time sitting. Even light physical activity can be beneficial. Watch cholesterol and blood lipids Have your blood tested for lipids and cholesterol at 50 years of age, then have this test every 5 years. Have your cholesterol levels checked more often if: Your lipid or cholesterol levels are high. You are older than 50 years of age. You are at high risk for heart disease. What should I know about cancer screening? Depending on your health history and family history, you may need to have cancer screening at various ages. This may include screening for: Breast cancer. Cervical cancer. Colorectal cancer. Skin cancer. Lung cancer. What should I know about heart disease, diabetes, and high blood pressure? Blood pressure and heart disease High blood pressure causes heart disease and increases the risk of stroke. This is more likely to develop in people who have high blood pressure readings or are overweight. Have your blood pressure checked: Every 3-5 years if you are 35-19 years of age. Every year if you are 48 years old or older. Diabetes Have regular diabetes screenings. This checks your fasting blood sugar level. Have the screening done: Once every three years after age 77 if you are at  a normal weight and have a low risk for diabetes. More often and at a younger age if you are overweight or have a high risk for diabetes. What should I know about preventing infection? Hepatitis B If you have a higher risk for hepatitis B, you should be screened for this virus. Talk with your health care provider to find out if you are at risk for hepatitis B infection. Hepatitis C Testing is recommended for: Everyone born from [birth year] through 1964. Anyone with known risk factors for hepatitis C. Sexually transmitted infections (STIs) Get screened for STIs, including gonorrhea and chlamydia, if: You are sexually active and are younger than 50 years of age. You are older than 50 years of age and your health care provider tells you that you are at risk for this type of infection. Your sexual activity has changed since you were last screened, and you are at increased risk for chlamydia or gonorrhea. Ask your health care provider if you are at risk. Ask your health care provider about whether you are at high risk for HIV. Your health care provider may recommend a prescription medicine to help prevent HIV infection. If you choose to take medicine to prevent HIV, you should first get tested for HIV. You should then be tested every 3 months for as long as you are taking the medicine. Pregnancy If you are about to stop having your period (premenopausal) and you may become pregnant, seek counseling before you get pregnant. Take 400 to 800 micrograms (mcg) of  folic acid every day if you become pregnant. Ask for birth control (contraception) if you want to prevent pregnancy. Osteoporosis and menopause Osteoporosis is a disease in which the bones lose minerals and strength with aging. This can result in bone fractures. If you are 39 years old or older, or if you are at risk for osteoporosis and fractures, ask your health care provider if you should: Be screened for bone loss. Take a calcium or vitamin D  supplement to lower your risk of fractures. Be given hormone replacement therapy (HRT) to treat symptoms of menopause. Follow these instructions at home: Alcohol use Do not drink alcohol if: Your health care provider tells you not to drink. You are pregnant, may be pregnant, or are planning to become pregnant. If you drink alcohol: Limit how much you have to: 0-1 drink a day. Know how much alcohol is in your drink. In the [place] one drink equals one 12 oz bottle of beer (355 mL), one 5 oz glass of wine (148 mL), or one 1 oz glass of hard liquor (44 mL). Lifestyle Do not use any products that contain nicotine or tobacco. These products include cigarettes, chewing tobacco, and vaping devices, such as e-cigarettes. If you need help quitting, ask your health care provider. Do not use street drugs. Do not share needles. Ask your health care provider for help if you need support or information about quitting drugs. General instructions Schedule regular health, dental, and eye exams. Stay current with your vaccines. Tell your health care provider if: You often feel depressed. You have ever been abused or do not feel safe at home. Summary Adopting a healthy lifestyle and getting preventive care are important in promoting health and wellness. Follow your health care provider's instructions about healthy diet, exercising, and getting tested or screened for diseases. Follow your health care provider's instructions on monitoring your cholesterol and blood pressure. This information is not intended to replace advice given to you by your health care provider. Make sure you discuss any questions you have with your health care provider. Document Revised: 06/06/2020 Document Reviewed: 06/06/2020 Elsevier Patient Education  [place], MD [facility]

## 2022-01-18 ENCOUNTER — Ambulatory Visit (INDEPENDENT_AMBULATORY_CARE_PROVIDER_SITE_OTHER): Payer: No Typology Code available for payment source

## 2022-01-18 ENCOUNTER — Encounter: Payer: Self-pay | Admitting: Podiatry

## 2022-01-18 ENCOUNTER — Other Ambulatory Visit (HOSPITAL_COMMUNITY): Payer: Self-pay

## 2022-01-18 ENCOUNTER — Ambulatory Visit (INDEPENDENT_AMBULATORY_CARE_PROVIDER_SITE_OTHER): Payer: No Typology Code available for payment source | Admitting: Podiatry

## 2022-01-18 DIAGNOSIS — M7752 Other enthesopathy of left foot: Secondary | ICD-10-CM | POA: Diagnosis not present

## 2022-01-18 DIAGNOSIS — M7750 Other enthesopathy of unspecified foot: Secondary | ICD-10-CM | POA: Diagnosis not present

## 2022-01-18 DIAGNOSIS — M722 Plantar fascial fibromatosis: Secondary | ICD-10-CM

## 2022-01-18 MED ORDER — MELOXICAM 15 MG PO TABS
15.0000 mg | ORAL_TABLET | Freq: Every day | ORAL | 3 refills | Status: DC
  Filled 2022-01-18: qty 30, 30d supply, fill #0
  Filled 2022-02-21: qty 30, 30d supply, fill #1

## 2022-01-18 NOTE — Progress Notes (Signed)
  Subjective:  Patient ID: Deanna Cook, female    DOB: 1970/06/21,  MRN: [id number]  Chief Complaint  Patient presents with   Foot Pain    (np) Chronic foot pain, left    51 y.o. female presents with the above complaint. History confirmed with patient.  Most of the pain is in the plantar left heel.  It is worse when she gets up from an activity.  Objective:  Physical Exam: warm, good capillary refill, no trophic changes or ulcerative lesions, normal DP and PT pulses, and normal sensory exam. Left Foot: point tenderness over the heel pad and point tenderness of the mid plantar fascia.  She has significant gastrocnemius equinus   Radiographs: Multiple views x-ray of the left foot: no fracture, dislocation, swelling or degenerative changes noted and very small calcaneal enthesophyte Assessment:   1. Plantar fasciitis of left foot      Plan:  Patient was evaluated and treated and all questions answered.  Discussed the etiology and treatment options for plantar fasciitis including stretching, formal physical therapy, supportive shoegears such as a running shoe or sneaker, pre fabricated orthoses, injection therapy, and oral medications. We also discussed the role of surgical treatment of this for patients who do not improve after exhausting non-surgical treatment options.   -XR reviewed with patient -Educated patient on stretching and icing of the affected limb -Night splint dispensed -Injection delivered to the plantar fascia of the left foot. -Rx for meloxicam. Educated on use, risks and benefits of the medication  After sterile prep with povidone-iodine solution and alcohol, the left heel was injected with 0.5cc 2% xylocaine plain, 0.5cc 0.5% marcaine plain, '5mg'$  triamcinolone acetonide, and '2mg'$  dexamethasone was injected along the medial plantar fascia at the insertion on the plantar calcaneus. The patient tolerated the procedure well without complication.  Return if symptoms  worsen or fail to improve.

## 2022-01-18 NOTE — Patient Instructions (Addendum)
Look for urea 40% cream or ointment and apply to the thickened dry skin / calluses. This can be bought over the counter, at a pharmacy or online such as [employer].    Plantar Fasciitis (Heel Spur Syndrome) with Rehab The plantar fascia is a fibrous, ligament-like, soft-tissue structure that spans the bottom of the foot. Plantar fasciitis is a condition that causes pain in the foot due to inflammation of the tissue. SYMPTOMS  Pain and tenderness on the underneath side of the foot. Pain that worsens with standing or walking. CAUSES  Plantar fasciitis is caused by irritation and injury to the plantar fascia on the underneath side of the foot. Common mechanisms of injury include: Direct trauma to bottom of the foot. Damage to a small nerve that runs under the foot where the main fascia attaches to the heel bone. Stress placed on the plantar fascia due to bone spurs. RISK INCREASES WITH:  Activities that place stress on the plantar fascia (running, jumping, pivoting, or cutting). Poor strength and flexibility. Improperly fitted shoes. Tight calf muscles. Flat feet. Failure to warm-up properly before activity. Obesity. PREVENTION Warm up and stretch properly before activity. Allow for adequate recovery between workouts. Maintain physical fitness: Strength, flexibility, and endurance. Cardiovascular fitness. Maintain a health body weight. Avoid stress on the plantar fascia. Wear properly fitted shoes, including arch supports for individuals who have flat feet.  PROGNOSIS  If treated properly, then the symptoms of plantar fasciitis usually resolve without surgery. However, occasionally surgery is necessary.  RELATED COMPLICATIONS  Recurrent symptoms that may result in a chronic condition. Problems of the lower back that are caused by compensating for the injury, such as limping. Pain or weakness of the foot during push-off following surgery. Chronic inflammation, scarring, and partial or  complete fascia tear, occurring more often from repeated injections.  TREATMENT  Treatment initially involves the use of ice and medication to help reduce pain and inflammation. The use of strengthening and stretching exercises may help reduce pain with activity, especially stretches of the Achilles tendon. These exercises may be performed at home or with a therapist. Your caregiver may recommend that you use heel cups of arch supports to help reduce stress on the plantar fascia. Occasionally, corticosteroid injections are given to reduce inflammation. If symptoms persist for greater than 6 months despite non-surgical (conservative), then surgery may be recommended.   MEDICATION  If pain medication is necessary, then nonsteroidal anti-inflammatory medications, such as aspirin and ibuprofen, or other minor pain relievers, such as acetaminophen, are often recommended. Do not take pain medication within 7 days before surgery. Prescription pain relievers may be given if deemed necessary by your caregiver. Use only as directed and only as much as you need. Corticosteroid injections may be given by your caregiver. These injections should be reserved for the most serious cases, because they may only be given a certain number of times.  HEAT AND COLD Cold treatment (icing) relieves pain and reduces inflammation. Cold treatment should be applied for 10 to 15 minutes every 2 to 3 hours for inflammation and pain and immediately after any activity that aggravates your symptoms. Use ice packs or massage the area with a piece of ice (ice massage). Heat treatment may be used prior to performing the stretching and strengthening activities prescribed by your caregiver, physical therapist, or athletic trainer. Use a heat pack or soak the injury in warm water.  SEEK IMMEDIATE MEDICAL CARE IF: Treatment seems to offer no benefit, or the condition worsens.  Any medications produce adverse side  effects.  EXERCISES- RANGE OF MOTION (ROM) AND STRETCHING EXERCISES - Plantar Fasciitis (Heel Spur Syndrome) These exercises may help you when beginning to rehabilitate your injury. Your symptoms may resolve with or without further involvement from your physician, physical therapist or athletic trainer. While completing these exercises, remember:  Restoring tissue flexibility helps normal motion to return to the joints. This allows healthier, less painful movement and activity. An effective stretch should be held for at least 30 seconds. A stretch should never be painful. You should only feel a gentle lengthening or release in the stretched tissue.  RANGE OF MOTION - Toe Extension, Flexion Sit with your right / left leg crossed over your opposite knee. Grasp your toes and gently pull them back toward the top of your foot. You should feel a stretch on the bottom of your toes and/or foot. Hold this stretch for 10 seconds. Now, gently pull your toes toward the bottom of your foot. You should feel a stretch on the top of your toes and or foot. Hold this stretch for 10 seconds. Repeat  times. Complete this stretch 3 times per day.   RANGE OF MOTION - Ankle Dorsiflexion, Active Assisted Remove shoes and sit on a chair that is preferably not on a carpeted surface. Place right / left foot under knee. Extend your opposite leg for support. Keeping your heel down, slide your right / left foot back toward the chair until you feel a stretch at your ankle or calf. If you do not feel a stretch, slide your bottom forward to the edge of the chair, while still keeping your heel down. Hold this stretch for 10 seconds. Repeat 3 times. Complete this stretch 2 times per day.   STRETCH  Gastroc, Standing Place hands on wall. Extend right / left leg, keeping the front knee somewhat bent. Slightly point your toes inward on your back foot. Keeping your right / left heel on the floor and your knee straight, shift  your weight toward the wall, not allowing your back to arch. You should feel a gentle stretch in the right / left calf. Hold this position for 10 seconds. Repeat 3 times. Complete this stretch 2 times per day.  STRETCH  Soleus, Standing Place hands on wall. Extend right / left leg, keeping the other knee somewhat bent. Slightly point your toes inward on your back foot. Keep your right / left heel on the floor, bend your back knee, and slightly shift your weight over the back leg so that you feel a gentle stretch deep in your back calf. Hold this position for 10 seconds. Repeat 3 times. Complete this stretch 2 times per day.  STRETCH  Gastrocsoleus, Standing  Note: This exercise can place a lot of stress on your foot and ankle. Please complete this exercise only if specifically instructed by your caregiver.  Place the ball of your right / left foot on a step, keeping your other foot firmly on the same step. Hold on to the wall or a rail for balance. Slowly lift your other foot, allowing your body weight to press your heel down over the edge of the step. You should feel a stretch in your right / left calf. Hold this position for 10 seconds. Repeat this exercise with a slight bend in your right / left knee. Repeat 3 times. Complete this stretch 2 times per day.   STRENGTHENING EXERCISES - Plantar Fasciitis (Heel Spur Syndrome)  These exercises may  help you when beginning to rehabilitate your injury. They may resolve your symptoms with or without further involvement from your physician, physical therapist or athletic trainer. While completing these exercises, remember:  Muscles can gain both the endurance and the strength needed for everyday activities through controlled exercises. Complete these exercises as instructed by your physician, physical therapist or athletic trainer. Progress the resistance and repetitions only as guided.  STRENGTH - Towel Curls Sit in a chair positioned on a  non-carpeted surface. Place your foot on a towel, keeping your heel on the floor. Pull the towel toward your heel by only curling your toes. Keep your heel on the floor. Repeat 3 times. Complete this exercise 2 times per day.  STRENGTH - Ankle Inversion Secure one end of a rubber exercise band/tubing to a fixed object (table, pole). Loop the other end around your foot just before your toes. Place your fists between your knees. This will focus your strengthening at your ankle. Slowly, pull your big toe up and in, making sure the band/tubing is positioned to resist the entire motion. Hold this position for 10 seconds. Have your muscles resist the band/tubing as it slowly pulls your foot back to the starting position. Repeat 3 times. Complete this exercises 2 times per day.  Document Released: 01/15/2005 Document Revised: 04/09/2011 Document Reviewed: 04/29/2008 [facility] Patient Information 2014 [place], [place].

## 2022-01-19 ENCOUNTER — Other Ambulatory Visit (HOSPITAL_COMMUNITY): Payer: Self-pay

## 2022-02-19 ENCOUNTER — Other Ambulatory Visit (HOSPITAL_COMMUNITY): Payer: Self-pay

## 2022-02-19 MED ORDER — METHYLPREDNISOLONE 4 MG PO TBPK
ORAL_TABLET | ORAL | 0 refills | Status: DC
  Filled 2022-02-19: qty 21, 6d supply, fill #0

## 2022-02-20 ENCOUNTER — Other Ambulatory Visit (HOSPITAL_COMMUNITY): Payer: Self-pay

## 2022-02-20 NOTE — Progress Notes (Signed)
52 y.o. G0P0000 Married Black or [place] American Not Hispanic or Latino female here for annual exam. H/o hysterectomy.  She having hot flashes ~2 x a day. She does sweat at night, ~2 x a night. She doesn't sleep well. She is irritable and having mood swings.  Sexually active, no dryness.   She states that she feels like sometimes she has a vaginal odor.     No LMP recorded. Patient has had a hysterectomy.          Sexually active: Yes.    The current method of family planning is status post hysterectomy.    Exercising: Yes.     Walking  Smoker:  no  Health Maintenance: Pap:  07/22/19 WNL  History of abnormal Pap:  yes early 2000, no surgery on   MMG:  06/14/21 density C Bi-rads 1 neg  BMD:   none  Colonoscopy: 10/31/20 f/u  5 years  TDaP:  2015 Gardasil: none    reports that she has never smoked. She has never been exposed to tobacco smoke. She has never used smokeless tobacco. She reports current alcohol use of about 3.0 standard drinks of alcohol per week. She reports that she does not use drugs. 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   Past Medical History:  Diagnosis Date   Allergic rhinitis    Allergy    Fibroid    Hypertension     Past Surgical History:  Procedure Laterality Date   ABDOMINAL HYSTERECTOMY     [facility]-2014   APPENDECTOMY  1989   COLONOSCOPY  2005   about 20 years ago in [place] exam   [place] in retina    Current Outpatient Medications  Medication Sig Dispense Refill   amLODipine (NORVASC) 10 MG tablet Take 1 tablet (10 mg total) by mouth daily. (Patient taking differently: Take 10 mg by mouth every other day.) 90 tablet 3   benzonatate (TESSALON) 200 MG capsule Take 1 capsule (200 mg total) by mouth 2 (two) times daily as needed for cough. 20 capsule 0   Cyanocobalamin (B-12) 1000 MCG TABS Take 1 tablet by mouth as needed.     fexofenadine (ALLEGRA) 180 MG tablet Take 180 mg by mouth as needed.     fluticasone (FLONASE) 50  MCG/ACT nasal spray Place into both nostrils as needed.     meloxicam (MOBIC) 15 MG tablet Take 1 tablet (15 mg total) by mouth daily. 30 tablet 3   methylPREDNISolone (MEDROL DOSEPAK) 4 MG TBPK tablet Take as directed 21 tablet 0   Multiple Vitamin (MULTIVITAMIN) capsule Take 1 capsule by mouth as needed.     Vitamin D, Ergocalciferol, (DRISDOL) 1.25 MG (50000 UNIT) CAPS capsule Take 1 capsule (50,000 Units total) by mouth every 7 (seven) days. 7 capsule 1   zolpidem (AMBIEN) 10 MG tablet Take 1 tablet (10 mg total) by mouth at bedtime as needed for sleep. 15 tablet 1   No current facility-administered medications for this visit.    Family History  Problem Relation Age of Onset   Colon polyps Mother    Hypertension Mother    Colon cancer Mother 93   Colon polyps Father    Heart attack Father    Diabetes Mellitus I Father    Kidney failure Father    Thyroid cancer Sister    Fibromyalgia Sister    Breast cancer Cousin    Kidney disease Brother    Breast cancer Maternal Aunt  Esophageal cancer Neg Hx    Stomach cancer Neg Hx    Rectal cancer Neg Hx     Review of Systems  Psychiatric/Behavioral:  Positive for agitation and sleep disturbance. The patient is nervous/anxious.   All other systems reviewed and are negative.   Exam:   There were no vitals taken for this visit.  Weight change: '@WEIGHTCHANGE'$ @ Height:      Ht Readings from Last 3 Encounters:  01/08/22 '5\' 9"'$  (1.753 m)  10/11/21 '5\' 9"'$  (1.753 m)  09/19/21 '5\' 9"'$  (1.753 m)    General appearance: alert, cooperative and appears stated age Head: Normocephalic, without obvious abnormality, atraumatic Neck: no adenopathy, supple, symmetrical, trachea midline and thyroid normal to inspection and palpation Lungs: clear to auscultation bilaterally Cardiovascular: regular rate and rhythm Breasts: normal appearance, no masses or tenderness Abdomen: soft, non-tender; non distended,  no masses,  no organomegaly Extremities:  extremities normal, atraumatic, no cyanosis or edema Skin: Skin color, texture, turgor normal. No rashes or lesions Lymph nodes: Cervical, supraclavicular, and axillary nodes normal. No abnormal inguinal nodes palpated Neurologic: Grossly normal   Pelvic: External genitalia:  no lesions              Urethra:  normal appearing urethra with no masses, tenderness or lesions              Bartholins and Skenes: normal                 Vagina: Mildly atrophic appearing vagina with a slight increase in watery, white, vaginal d/c              Cervix: absent               Bimanual Exam:  Uterus:  uterus absent              Adnexa: no mass, fullness, tenderness               Rectovaginal: Confirms               Anus:  normal sphincter tone, no lesions  Gae Dry, CMA chaperoned for the exam.  1. Well woman exam Discussed breast self exam Discussed calcium and vit D intake Labs with primary Mammogram in 5/24  2. Hot flashes - TSH - Follicle stimulating hormone -Discussed avoiding triggers, behavior changes, option of herbal products. -Discussed ERT, reviewed risks. She would like to start it. - estradiol (VIVELLE-DOT) 0.025 MG/24HR; Place 1 patch onto the skin 2 (two) times a week.  Dispense: 8 patch; Refill: 12 -F/U in one month  3. Night sweats - TSH - Follicle stimulating hormone - estradiol (VIVELLE-DOT) 0.025 MG/24HR; Place 1 patch onto the skin 2 (two) times a week.  Dispense: 8 patch; Refill: 12  4. Mood changes  5. Vaginal odor - WET PREP FOR TRICH, YEAST, CLUE  6. Yeast vaginitis - fluconazole (DIFLUCAN) 150 MG tablet; Take 1 tablet (150 mg total) by mouth once for 1 dose. Take one tablet.  Repeat in 72 hours if symptoms are not completely resolved.  Dispense: 2 tablet; Refill: 0

## 2022-02-21 ENCOUNTER — Ambulatory Visit (INDEPENDENT_AMBULATORY_CARE_PROVIDER_SITE_OTHER): Payer: 59 | Admitting: Neurology

## 2022-02-21 ENCOUNTER — Encounter: Payer: Self-pay | Admitting: Neurology

## 2022-02-21 VITALS — BP 148/90 | HR 90 | Ht 69.0 in | Wt 225.2 lb

## 2022-02-21 DIAGNOSIS — R5383 Other fatigue: Secondary | ICD-10-CM | POA: Diagnosis not present

## 2022-02-21 DIAGNOSIS — R0683 Snoring: Secondary | ICD-10-CM | POA: Diagnosis not present

## 2022-02-21 DIAGNOSIS — E66811 Obesity, class 1: Secondary | ICD-10-CM

## 2022-02-21 DIAGNOSIS — Z9189 Other specified personal risk factors, not elsewhere classified: Secondary | ICD-10-CM | POA: Diagnosis not present

## 2022-02-21 DIAGNOSIS — R351 Nocturia: Secondary | ICD-10-CM

## 2022-02-21 DIAGNOSIS — R030 Elevated blood-pressure reading, without diagnosis of hypertension: Secondary | ICD-10-CM

## 2022-02-21 DIAGNOSIS — E669 Obesity, unspecified: Secondary | ICD-10-CM | POA: Diagnosis not present

## 2022-02-21 NOTE — Patient Instructions (Signed)

## 2022-02-21 NOTE — Progress Notes (Signed)
Subjective:    Patient ID: Deanna Cook is a 52 y.o. female.  HPI    Star Age, MD, PhD [facility] [address] [place], [place] [address]  Dear Dr. Mitchel Honour,  I saw your patient, Deanna Cook, upon your kind request in my sleep clinic today for initial consultation of her sleep disorder, in particular, concern for underlying obstructive sleep apnea.  The patient is unaccompanied today.  As you know, Deanna Cook is a 52 year old female with an underlying medical history of Sjogren's syndrome, chronic right knee pain and left foot pain, allergic rhinitis, thyroid disease, hypertension, and obesity, who reports chronic difficulty initiating and maintaining sleep as well as snoring and daytime somnolence.  Her Epworth sleepiness score is 4 out of 24, fatigue severity score is 41 out of 63.  Of note, her blood pressure is elevated today, she reports that she has not taken her blood pressure medication.  She denies any symptoms of chest pain, shortness of breath, blurry vision, or headache at this time.  I reviewed your office visit note from 01/08/2022.  For the past year she has had difficulty maintaining sleep.  She often wakes up right around 3 or 3:30 AM and may not go back to sleep for at least a couple of hours.  Bedtime is generally around 11 PM and rise time between 6:30 AM and 7 AM.  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  She works Mondays, Tuesdays, Thursdays, and Fridays.  She lives with her husband, they have no children.  She has nocturia about once per average night, no recurrent morning headaches or nocturnal headaches typically.  She has a distant cousin with sleep apnea.  She has limited caffeine, usually 1 cup of coffee in the morning, occasional alcohol, usually on weekends, does not smoke.  She has a TV in her bedroom but does not have it on at night typically.  Over-the-counter medications  and treatments she has tried for her sleep include a supplement called calm which made her jittery, melatonin ordid not help, sleepytime tea did not help.  She was given a prescription for Ambien 10 mg strength but has not tried it.  She was afraid to try it.  Her Past Medical History Is Significant For: Past Medical History:  Diagnosis Date   Allergic rhinitis    Allergy    Fibroid    Hypertension     Her Past Surgical History Is Significant For: Past Surgical History:  Procedure Laterality Date   ABDOMINAL HYSTERECTOMY     [facility]-2014   APPENDECTOMY  1989   COLONOSCOPY  2005   about 20 years ago in [place] exam   Grainger in retina    Her Family History Is Significant For: Family History  Problem Relation Age of Onset   Colon polyps Mother    Hypertension Mother    Colon cancer Mother 19   Colon polyps Father    Heart attack Father    Diabetes Mellitus I Father    Kidney failure Father    Thyroid cancer Sister    Fibromyalgia Sister    Kidney disease Brother    Breast cancer Maternal Aunt    Breast cancer Cousin    Esophageal cancer Neg Hx    Stomach cancer Neg Hx    Rectal cancer Neg Hx    Sleep apnea Neg Hx     Her  Social History Is Significant For: Social History   Socioeconomic History   Marital status: Married    Spouse name: Not on file   Number of children: Not on file   Years of education: Not on file   Highest education level: Not on file  Occupational History   Not on file  Tobacco Use   Smoking status: Never    Passive exposure: Never   Smokeless tobacco: Never  Vaping Use   Vaping Use: Never used  Substance and Sexual Activity   Alcohol use: Yes    Alcohol/week: 3.0 standard drinks of alcohol    Types: 3 Standard drinks or equivalent per week    Comment: social   Drug use: Never   Sexual activity: Yes    Birth control/protection: Surgical    Comment: 1st intercourse 52 yo-More than 5 partners  Other Topics Concern    Not on file  Social History Narrative   Not on file   Social Determinants of Health   Financial Resource Strain: Not on file  Food Insecurity: Not on file  Transportation Needs: Not on file  Physical Activity: Not on file  Stress: Not on file  Social Connections: Not on file    Her Allergies Are:  No Known Allergies:   Her Current Medications Are:  Outpatient Encounter Medications as of 02/21/2022  Medication Sig   amLODipine (NORVASC) 10 MG tablet Take 1 tablet (10 mg total) by mouth daily. (Patient taking differently: Take 10 mg by mouth every other day.)   benzonatate (TESSALON) 200 MG capsule Take 1 capsule (200 mg total) by mouth 2 (two) times daily as needed for cough.   Cyanocobalamin (B-12) 1000 MCG TABS Take 1 tablet by mouth as needed.   fexofenadine (ALLEGRA) 180 MG tablet Take 180 mg by mouth as needed.   fluticasone (FLONASE) 50 MCG/ACT nasal spray Place into both nostrils as needed.   meloxicam (MOBIC) 15 MG tablet Take 1 tablet (15 mg total) by mouth daily.   methylPREDNISolone (MEDROL DOSEPAK) 4 MG TBPK tablet Take as directed   Multiple Vitamin (MULTIVITAMIN) capsule Take 1 capsule by mouth as needed.   Vitamin D, Ergocalciferol, (DRISDOL) 1.25 MG (50000 UNIT) CAPS capsule Take 1 capsule (50,000 Units total) by mouth every 7 (seven) days.   zolpidem (AMBIEN) 10 MG tablet Take 1 tablet (10 mg total) by mouth at bedtime as needed for sleep. (Patient not taking: Reported on 02/21/2022)   No facility-administered encounter medications on file as of 02/21/2022.  :   Review of Systems:  Out of a complete 14 point review of systems, all are reviewed and negative with the exception of these symptoms as listed below:   Review of Systems  Neurological:        Pt here sleep consult  Pt snores,fatigue,hypertension, sinus headaches. Pt denies sleep study, CPAP machine     ESS:4 FSS:41     Objective:  Neurological Exam  Physical Exam Physical Examination:    Vitals:   02/21/22 1230 02/21/22 1235  BP: (!) 155/88 (!) 148/90  Pulse: 74 90   General Examination: The patient is a very pleasant 52 y.o. female in no acute distress. She appears well-developed and well-nourished and well groomed.  Of note, patient's blood pressure is elevated, she reports that she has not taken her blood pressure medication today, denies any symptoms of chest pain or shortness of breath or blurry vision or headaches.  HEENT: Normocephalic, atraumatic, pupils are equal, round and reactive to light,  extraocular tracking is good without limitation to gaze excursion or nystagmus noted. Hearing is grossly intact. Face is symmetric with normal facial animation. Speech is clear with no dysarthria noted. There is no hypophonia. There is no lip, neck/head, jaw or voice tremor. Neck is supple with full range of passive and active motion. There are no carotid bruits on auscultation. Oropharynx exam reveals: mild mouth dryness, good dental hygiene and mild airway crowding, due to small airway entry, tonsils on the smaller side.  Mallampati class II.  Minimal to mild overbite noted.  Neck circumference 17 1/8 inches.  Tongue protrudes centrally and palate elevates symmetrically.  Chest: Clear to auscultation without wheezing, rhonchi or crackles noted.  Heart: S1+S2+0, regular and normal without murmurs, rubs or gallops noted.   Abdomen: Soft, non-tender and non-distended.  Extremities: There is no pitting edema in the distal lower extremities bilaterally.   Skin: Warm and dry without trophic changes noted.   Musculoskeletal: exam reveals no obvious joint deformities.   Neurologically:  Mental status: The patient is awake, alert and oriented in all 4 spheres. Her immediate and remote memory, attention, language skills and fund of knowledge are appropriate. There is no evidence of aphasia, agnosia, apraxia or anomia. Speech is clear with normal prosody and enunciation. Thought  process is linear. Mood is normal and affect is normal.  Cranial nerves II - XII are as described above under HEENT exam.  Motor exam: Normal bulk, strength and tone is noted. There is no obvious action or resting tremor.  Fine motor skills and coordination: grossly intact.  Cerebellar testing: No dysmetria or intention tremor. There is no truncal or gait ataxia.  Sensory exam: intact to light touch in the upper and lower extremities.  Gait, station and balance: She stands easily. No veering to one side is noted. No leaning to one side is noted. Posture is age-appropriate and stance is narrow based. Gait shows normal stride length and normal pace. No problems turning are noted.   Assessment and Plan:  In summary, Deanna Cook is a very pleasant 52 y.o.-year old female with an underlying medical history of Sjogren's syndrome, chronic right knee pain and left foot pain, allergic rhinitis, thyroid disease, hypertension, and obesity, whose history and physical exam are concerning for sleep disordered breathing, supporting a current working diagnosis of unspecified sleep apnea, particularly underlying obstructive sleep apnea. A laboratory attended sleep study is typically considered "gold standard" for evaluation of sleep disordered breathing.   I had a long chat with the patient about my findings and the diagnosis of sleep apnea, particularly OSA, its prognosis and treatment options. We talked about medical/conservative treatments, surgical interventions and non-pharmacological approaches for symptom control. I explained, in particular, the risks and ramifications of untreated moderate to severe OSA, especially with respect to developing cardiovascular disease down the road, including congestive heart failure (CHF), difficult to treat hypertension, cardiac arrhythmias (particularly A-fib), neurovascular complications including TIA, stroke and dementia. Even type 2 diabetes has, in part, been linked to  untreated OSA. Symptoms of untreated OSA may include (but may not be limited to) daytime sleepiness, nocturia (i.e. frequent nighttime urination), memory problems, mood irritability and suboptimally controlled or worsening mood disorder such as depression and/or anxiety, lack of energy, lack of motivation, physical discomfort, as well as recurrent headaches, especially morning or nocturnal headaches. We talked about the importance of maintaining a healthy lifestyle and striving for healthy weight. In addition, we talked about the importance of striving for and maintaining good  sleep hygiene.  She can try the Ambien, I would recommend a 5 mg dose for her, for women the dose recommendation is 5 mg only, not 10 mg. I recommended a sleep study at this time. I outlined the differences between a laboratory attended sleep study which is considered more comprehensive and accurate over the option of a home sleep test (HST); the latter may lead to underestimation of sleep disordered breathing in some instances and does not help with diagnosing upper airway resistance syndrome and is not accurate enough to diagnose primary central sleep apnea typically. Her blood pressure is elevated today.  She is reminded to take her blood pressure medicine when she gets home. I outlined possible surgical and non-surgical treatment options of OSA, including the use of a positive airway pressure (PAP) device (i.e. CPAP, AutoPAP/APAP or BiPAP in certain circumstances), a custom-made dental device (aka oral appliance, which would require a referral to a specialist dentist or orthodontist typically, and is generally speaking not considered for patients with full dentures or edentulous state), and surgical options (which would involve a referral for consultation with an ENT surgeon). I explained the PAP treatment option to the patient in detail, as this is generally considered first-line treatment.  The patient indicated that she would be  willing to try PAP therapy, if the need arises. I explained the importance of being compliant with PAP treatment, not only for insurance purposes but primarily to improve patient's symptoms symptoms, and for the patient's long term health benefit, including to reduce Her cardiovascular risks longer-term.    We will pick up our discussion about the next steps and treatment options after testing.  We will keep her posted as to the test results by phone call and/or MyChart messaging where possible.  We will plan to follow-up in sleep clinic accordingly as well.  I answered all her questions today and the patient was in agreement.   I encouraged her to call with any interim questions, concerns, problems or updates or email [place] through [place].  Generally speaking, sleep test authorizations may take up to 2 weeks, sometimes less, sometimes longer, the patient is encouraged to get in touch with [place] if they do not hear back from the sleep lab staff directly within the next 2 weeks.  Thank you very much for allowing me to participate in the care of this nice patient. If I can be of any further assistance to you please do not hesitate to call me at [phone].  Sincerely,   Star Age, MD, PhD

## 2022-02-23 ENCOUNTER — Other Ambulatory Visit (HOSPITAL_COMMUNITY): Payer: Self-pay

## 2022-02-23 ENCOUNTER — Ambulatory Visit (INDEPENDENT_AMBULATORY_CARE_PROVIDER_SITE_OTHER): Payer: 59 | Admitting: Obstetrics and Gynecology

## 2022-02-23 ENCOUNTER — Encounter: Payer: Self-pay | Admitting: Obstetrics and Gynecology

## 2022-02-23 VITALS — BP 128/74 | HR 95 | Ht 69.0 in | Wt 221.0 lb

## 2022-02-23 DIAGNOSIS — R61 Generalized hyperhidrosis: Secondary | ICD-10-CM | POA: Diagnosis not present

## 2022-02-23 DIAGNOSIS — M3500 Sicca syndrome, unspecified: Secondary | ICD-10-CM | POA: Diagnosis not present

## 2022-02-23 DIAGNOSIS — R232 Flushing: Secondary | ICD-10-CM | POA: Diagnosis not present

## 2022-02-23 DIAGNOSIS — I10 Essential (primary) hypertension: Secondary | ICD-10-CM | POA: Diagnosis not present

## 2022-02-23 DIAGNOSIS — B3731 Acute candidiasis of vulva and vagina: Secondary | ICD-10-CM | POA: Diagnosis not present

## 2022-02-23 DIAGNOSIS — R4586 Emotional lability: Secondary | ICD-10-CM | POA: Diagnosis not present

## 2022-02-23 DIAGNOSIS — Z01419 Encounter for gynecological examination (general) (routine) without abnormal findings: Secondary | ICD-10-CM

## 2022-02-23 DIAGNOSIS — E559 Vitamin D deficiency, unspecified: Secondary | ICD-10-CM | POA: Diagnosis not present

## 2022-02-23 DIAGNOSIS — R768 Other specified abnormal immunological findings in serum: Secondary | ICD-10-CM | POA: Diagnosis not present

## 2022-02-23 DIAGNOSIS — N898 Other specified noninflammatory disorders of vagina: Secondary | ICD-10-CM

## 2022-02-23 DIAGNOSIS — R5383 Other fatigue: Secondary | ICD-10-CM | POA: Diagnosis not present

## 2022-02-23 LAB — WET PREP FOR TRICH, YEAST, CLUE

## 2022-02-23 MED ORDER — ESTRADIOL 0.025 MG/24HR TD PTTW
1.0000 | MEDICATED_PATCH | TRANSDERMAL | 12 refills | Status: DC
Start: 2022-02-26 — End: 2022-03-20
  Filled 2022-02-23: qty 8, 28d supply, fill #0

## 2022-02-23 MED ORDER — FLUCONAZOLE 150 MG PO TABS
150.0000 mg | ORAL_TABLET | ORAL | 0 refills | Status: DC
Start: 2022-02-23 — End: 2022-03-20
  Filled 2022-02-23: qty 2, 3d supply, fill #0

## 2022-02-23 NOTE — Patient Instructions (Addendum)
Vaginal Yeast Infection, Adult  Vaginal yeast infection is a condition that causes vaginal discharge as well as soreness, swelling, and redness (inflammation) of the vagina. This is a common condition. Some women get this infection frequently. What are the causes? This condition is caused by a change in the normal balance of the yeast (Candida) and normal bacteria that live in the vagina. This change causes an overgrowth of yeast, which causes the inflammation. What increases the risk? The condition is more likely to develop in women who: Take antibiotic medicines. Have diabetes. Take birth control pills. Are pregnant. Douche often. Have a weak body defense system (immune system). Have been taking steroid medicines for a long time. Frequently wear tight clothing. What are the signs or symptoms? Symptoms of this condition include: White, thick, creamy vaginal discharge. Swelling, itching, redness, and irritation of the vagina. The lips of the vagina (labia) may be affected as well. Pain or a burning feeling while urinating. Pain during sex. How is this diagnosed? This condition is diagnosed based on: Your medical history. A physical exam. A pelvic exam. Your health care provider will examine a sample of your vaginal discharge under a microscope. Your health care provider may send this sample for testing to confirm the diagnosis. How is this treated? This condition is treated with medicine. Medicines may be over-the-counter or prescription. You may be told to use one or more of the following: Medicine that is taken by mouth (orally). Medicine that is applied as a cream (topically). Medicine that is inserted directly into the vagina (suppository). Follow these instructions at home: Take or apply over-the-counter and prescription medicines only as told by your health care provider. Do not use tampons until your health care provider approves. Do not have sex until your infection has  cleared. Sex can prolong or worsen your symptoms of infection. Ask your health care provider when it is safe to resume sexual activity. Keep all follow-up visits. This is important. How is this prevented?  Do not wear tight clothes, such as pantyhose or tight pants. Wear breathable cotton underwear. Do not use douches, perfumed soap, creams, or powders. Wipe from front to back after using the toilet. If you have diabetes, keep your blood sugar levels under control. Ask your health care provider for other ways to prevent yeast infections. Contact a health care provider if: You have a fever. Your symptoms go away and then return. Your symptoms do not get better with treatment. Your symptoms get worse. You have new symptoms. You develop blisters in or around your vagina. You have blood coming from your vagina and it is not your menstrual period. You develop pain in your abdomen. Summary Vaginal yeast infection is a condition that causes discharge as well as soreness, swelling, and redness (inflammation) of the vagina. This condition is treated with medicine. Medicines may be over-the-counter or prescription. Take or apply over-the-counter and prescription medicines only as told by your health care provider. Do not douche. Resume sexual activity or use of tampons as instructed by your health care provider. Contact a health care provider if your symptoms do not get better with treatment or your symptoms go away and then return. This information is not intended to replace advice given to you by your health care provider. Make sure you discuss any questions you have with your health care provider. Document Revised: 04/04/2020 Document Reviewed: 04/04/2020 Elsevier Patient Education  [place]. Menopause and Hormone Replacement Therapy Menopause is a normal time of life  when menstrual periods stop completely and the ovaries stop producing the female hormones estrogen and progesterone.  Low levels of these hormones can affect your health and cause symptoms. Hormone replacement therapy (HRT) can relieve some of those symptoms. HRT is the use of artificial (synthetic) hormones to replace hormones that your body has stopped producing because you have reached menopause. Types of HRT  HRT may consist of the synthetic hormones estrogen and progestin, or it may consist of estrogen-only therapy. You and your health care provider will decide which form of HRT is best for you. If you choose to be on HRT and you have a uterus, estrogen and progestin are usually prescribed. Estrogen-only therapy is used for women who do not have a uterus. Possible options for taking HRT include: Pills. Patches. Gels. Sprays. Vaginal cream. Vaginal rings. Vaginal inserts. The amount of hormones that you take and how long you take them varies according to your health. It is important to: Begin HRT with the lowest possible dosage. Stop HRT as soon as your health care provider tells you to stop. Work with your health care provider so that you feel informed and comfortable with your decisions. Tell a health care provider about: Any allergies you have. Whether you have had blood clots or know of any risk factors you may have for blood clots. Whether you or family members have had cancer, especially cancer of the breasts, ovaries, or uterus. Any surgeries you have had. All medicines you are taking, including vitamins, herbs, eye drops, creams, and over-the-counter medicines. Whether you are pregnant or may be pregnant. Any medical conditions you have. What are the benefits? HRT can reduce the frequency and severity of menopausal symptoms. Benefits of HRT vary according to the kind of symptoms that you have, how severe they are, and your overall health. HRT may help to improve the following symptoms of menopause: Hot flashes and night sweats. These are sudden feelings of heat that spread over the face and  body. The skin may turn red, like a blush. Night sweats are hot flashes that happen while you are sleeping or trying to sleep. Bone loss (osteoporosis). The body loses calcium more quickly after menopause, causing the bones to become weaker. This can increase the risk for bone breaks (fractures). Vaginal dryness. The lining of the vagina can become thin and dry, which can cause pain during sex or cause infection, burning, or itching. Urinary tract infections. Urinary incontinence. This is the inability to control when you urinate. Irritability. Short-term memory problems. What are the risks? Risks of HRT vary depending on your individual health and medical history. Risks of HRT also depend on whether you receive both estrogen and progestin or you receive estrogen only. HRT may increase the risk of: Spotting. This is when a small amount of blood leaks from the vagina unexpectedly. Endometrial cancer. This cancer is in the lining of the uterus (endometrium). Breast cancer. Increased density of breast tissue. This can make it harder to find breast cancer on a breast X-ray (mammogram). Stroke. Heart disease. Blood clots. Gallbladder disease or liver disease. Risks of HRT can increase if you have any of the following conditions: Endometrial cancer. Liver disease. Heart disease. Breast cancer. History of blood clots. History of stroke. Follow these instructions at home: Pap tests Have Pap tests done as often as told by your health care provider. A Pap test is sometimes called a Pap smear. It is a screening test that is used to check for signs of  cancer of the cervix and vagina. A Pap test can also identify the presence of infection or precancerous changes. Pap tests may be done: Every 3 years, starting at age 35. Every 5 years, starting after age 24, in combination with testing for human papillomavirus (HPV). More often or less often depending on other medical conditions you have, your age,  and other risk factors. It is up to you to get the results of your Pap test. Ask your health care provider, or the department that is doing the test, when your results will be ready. General instructions Take over-the-counter and prescription medicines only as told by your health care provider. Do not use any products that contain nicotine or tobacco. These products include cigarettes, chewing tobacco, and vaping devices, such as e-cigarettes. If you need help quitting, ask your health care provider. Get mammograms, pelvic exams, and medical checkups as often as told by your health care provider. Keep all follow-up visits. This is important. Contact a health care provider if you have: Pain or swelling in your legs. Lumps or changes in your breasts or armpits. Pain, burning, or bleeding when you urinate. Unusual vaginal bleeding. Dizziness or headaches. Pain in your abdomen. Get help right away if you have: Shortness of breath. Chest pain. Slurred speech. Weakness or numbness in any part of your arms or legs. These symptoms may represent a serious problem that is an emergency. Do not wait to see if the symptoms will go away. Get medical help right away. Call your local emergency services (911 in the [place] Do not drive yourself to the hospital. Summary Menopause is a normal time of life when menstrual periods stop completely and the ovaries stop producing the female hormones estrogen and progesterone. HRT can reduce the frequency and severity of menopausal symptoms. Risks of HRT vary depending on your individual health and medical history. This information is not intended to replace advice given to you by your health care provider. Make sure you discuss any questions you have with your health care provider. Document Revised: 07/20/2019 Document Reviewed: 07/20/2019 Elsevier Patient Education  [place].

## 2022-02-24 ENCOUNTER — Encounter: Payer: Self-pay | Admitting: Obstetrics and Gynecology

## 2022-02-24 LAB — TSH: TSH: 1.43 mIU/L

## 2022-02-24 LAB — FOLLICLE STIMULATING HORMONE: FSH: 41.7 m[IU]/mL

## 2022-03-19 ENCOUNTER — Telehealth: Payer: Self-pay | Admitting: Neurology

## 2022-03-19 NOTE — Telephone Encounter (Signed)
03/13/22 [employer] no auth req auto machine ref # [id number]  left VM 03/15/22 [place]

## 2022-03-20 ENCOUNTER — Other Ambulatory Visit (HOSPITAL_COMMUNITY): Payer: Self-pay

## 2022-03-20 ENCOUNTER — Encounter: Payer: Self-pay | Admitting: Obstetrics and Gynecology

## 2022-03-20 ENCOUNTER — Ambulatory Visit (INDEPENDENT_AMBULATORY_CARE_PROVIDER_SITE_OTHER): Payer: 59 | Admitting: Obstetrics and Gynecology

## 2022-03-20 VITALS — BP 124/80 | HR 65 | Wt 223.0 lb

## 2022-03-20 DIAGNOSIS — Z5181 Encounter for therapeutic drug level monitoring: Secondary | ICD-10-CM | POA: Diagnosis not present

## 2022-03-20 DIAGNOSIS — Z79890 Hormone replacement therapy (postmenopausal): Secondary | ICD-10-CM | POA: Diagnosis not present

## 2022-03-20 MED ORDER — ESTRADIOL 0.0375 MG/24HR TD PTTW
1.0000 | MEDICATED_PATCH | TRANSDERMAL | 1 refills | Status: DC
  Filled 2022-03-20: qty 8, 28d supply, fill #0
  Filled 2022-04-16: qty 8, 28d supply, fill #1

## 2022-03-20 NOTE — Progress Notes (Signed)
GYNECOLOGY  VISIT   HPI: 52 y.o.   Married Black or [place] American Not Hispanic or Latino  female   G0P0000 with No LMP recorded. Patient has had a hysterectomy.   here for medication follow up with the 0.025 mg Estradiol dot. Patient states that she is doing much better. Mood is much improved. Hot flashes 1-2 x a day, don't last long. Not having as many night sweats, not soaking her sheets. She is waking up at 3 am, not hot.   GYNECOLOGIC HISTORY: No LMP recorded. Patient has had a hysterectomy. Contraception:hysterectomy  Menopausal hormone therapy: estradiol patch.         OB History     Gravida  0   Para  0   Term  0   Preterm  0   AB  0   Living  0      SAB  0   IAB  0   Ectopic  0   Multiple  0   Live Births  0              Patient Active Problem List   Diagnosis Date Noted   Chronic insomnia 01/08/2022   Exhaustion 01/08/2022   Sjogren's syndrome ([place]) 01/08/2022   Chronic foot pain, left 01/08/2022   Suspected sleep apnea 01/08/2022   Protrusion of lumbar intervertebral disc 09/19/2021   Positive ANA (antinuclear antibody) 10/21/2020   Chronic pain of right knee 10/21/2020   Essential hypertension 02/11/2019   Refusal of blood transfusions as patient is Jehovah's Witness 08/07/2018   Seasonal allergies 08/07/2018    Past Medical History:  Diagnosis Date   Allergic rhinitis    Allergy    Fibroid    Hypertension     Past Surgical History:  Procedure Laterality Date   ABDOMINAL HYSTERECTOMY     [facility]-2014   APPENDECTOMY  1989   COLONOSCOPY  2005   about 20 years ago in [place] exam   [place] in retina    Current Outpatient Medications  Medication Sig Dispense Refill   amLODipine (NORVASC) 10 MG tablet Take 1 tablet (10 mg total) by mouth daily. (Patient taking differently: Take 10 mg by mouth every other day.) 90 tablet 3   estradiol (VIVELLE-DOT) 0.025 MG/24HR Place 1 patch onto the skin 2 (two) times a week.  8 patch 12   Cyanocobalamin (B-12) 1000 MCG TABS Take 1 tablet by mouth as needed.     fexofenadine (ALLEGRA) 180 MG tablet Take 180 mg by mouth as needed.     fluticasone (FLONASE) 50 MCG/ACT nasal spray Place into both nostrils as needed.     Multiple Vitamin (MULTIVITAMIN) capsule Take 1 capsule by mouth as needed.     Vitamin D, Ergocalciferol, (DRISDOL) 1.25 MG (50000 UNIT) CAPS capsule Take 1 capsule (50,000 Units total) by mouth every 7 (seven) days. 7 capsule 1   zolpidem (AMBIEN) 10 MG tablet Take 1 tablet (10 mg total) by mouth at bedtime as needed for sleep. (Patient not taking: Reported on 02/21/2022) 15 tablet 1   No current facility-administered medications for this visit.     ALLERGIES: Patient has no known allergies.  Family History  Problem Relation Age of Onset   Colon polyps Mother    Hypertension Mother    Colon cancer Mother 44   Colon polyps Father    Heart attack Father    Diabetes Mellitus I Father    Kidney failure Father  Thyroid cancer Sister    Fibromyalgia Sister    Kidney disease Brother    Breast cancer Maternal Aunt    Breast cancer Cousin    Esophageal cancer Neg Hx    Stomach cancer Neg Hx    Rectal cancer Neg Hx    Sleep apnea Neg Hx     Social History   Socioeconomic History   Marital status: Married    Spouse name: Not on file   Number of children: Not on file   Years of education: Not on file   Highest education level: Not on file  Occupational History   Not on file  Tobacco Use   Smoking status: Never    Passive exposure: Never   Smokeless tobacco: Never  Vaping Use   Vaping Use: Never used  Substance and Sexual Activity   Alcohol use: Yes    Alcohol/week: 3.0 standard drinks of alcohol    Types: 3 Standard drinks or equivalent per week    Comment: social   Drug use: Never   Sexual activity: Yes    Birth control/protection: Surgical    Comment: 1st intercourse 52 yo-More than 5 partners  Other Topics Concern   Not on  file  Social History Narrative   Not on file   Social Determinants of Health   Financial Resource Strain: Not on file  Food Insecurity: Not on file  Transportation Needs: Not on file  Physical Activity: Not on file  Stress: Not on file  Social Connections: Not on file  Intimate Partner Violence: Not on file    Review of Systems  All other systems reviewed and are negative.   PHYSICAL EXAMINATION:    BP 124/80   Pulse 65   Wt 223 lb (101.2 kg)   SpO2 100%   BMI 32.93 kg/m     General appearance: alert, cooperative and appears stated age  46. Encounter for monitoring postmenopausal estrogen replacement therapy Doing so much better on low dose ERT, improved mood, improved sleep. Still having some hot flashes and sleep issues. Will try the next dose up.  - estradiol (VIVELLE-DOT) 0.0375 MG/24HR; Place 1 patch onto the skin 2 (two) times a week.  Dispense: 8 patch; Refill: 1 -F/U in one month, if she is doing great and just wants a refill she will reach out.

## 2022-04-06 NOTE — Progress Notes (Signed)
GYNECOLOGY  VISIT   HPI: 52 y.o.   Married Black or [place] American Not Hispanic or Latino  female   G0P0000 with No LMP recorded. Patient has had a hysterectomy.   here for f/u of ERT. Last month she was increased to the 0.0375 mg patch. She feels 100 x better on this patch. If she is late changing the patch she has symptoms. She is waking up 2 x at night feeling hot.  She is having some issues remembering things. Not sleeping great. Taking 1/2 an Ambien at night that helps.   GYNECOLOGIC HISTORY: No LMP recorded. Patient has had a hysterectomy. Contraception:hysterectomy Menopausal hormone therapy: estradiol        OB History     Gravida  0   Para  0   Term  0   Preterm  0   AB  0   Living  0      SAB  0   IAB  0   Ectopic  0   Multiple  0   Live Births  0              Patient Active Problem List   Diagnosis Date Noted   Chronic insomnia 01/08/2022   Exhaustion 01/08/2022   Sjogren's syndrome ([place]) 01/08/2022   Chronic foot pain, left 01/08/2022   Suspected sleep apnea 01/08/2022   Protrusion of lumbar intervertebral disc 09/19/2021   Positive ANA (antinuclear antibody) 10/21/2020   Chronic pain of right knee 10/21/2020   Essential hypertension 02/11/2019   Refusal of blood transfusions as patient is Jehovah's Witness 08/07/2018   Seasonal allergies 08/07/2018    Past Medical History:  Diagnosis Date   Allergic rhinitis    Allergy    Fibroid    Hypertension     Past Surgical History:  Procedure Laterality Date   ABDOMINAL HYSTERECTOMY     [facility]-2014   APPENDECTOMY  1989   COLONOSCOPY  2005   about 20 years ago in [place] exam   [place] in retina    Current Outpatient Medications  Medication Sig Dispense Refill   amLODipine (NORVASC) 10 MG tablet Take 1 tablet (10 mg total) by mouth daily. (Patient taking differently: Take 10 mg by mouth every other day.) 90 tablet 3   Cyanocobalamin (B-12) 1000 MCG TABS Take 1  tablet by mouth as needed.     estradiol (VIVELLE-DOT) 0.0375 MG/24HR Place 1 patch onto the skin 2 (two) times a week. 8 patch 1   fexofenadine (ALLEGRA) 180 MG tablet Take 180 mg by mouth as needed.     fluticasone (FLONASE) 50 MCG/ACT nasal spray Place into both nostrils as needed.     Multiple Vitamin (MULTIVITAMIN) capsule Take 1 capsule by mouth as needed.     Vitamin D, Ergocalciferol, (DRISDOL) 1.25 MG (50000 UNIT) CAPS capsule Take 1 capsule (50,000 Units total) by mouth every 7 (seven) days. 7 capsule 1   zolpidem (AMBIEN) 10 MG tablet Take 1 tablet (10 mg total) by mouth at bedtime as needed for sleep. (Patient not taking: Reported on 02/21/2022) 15 tablet 1   No current facility-administered medications for this visit.     ALLERGIES: Patient has no known allergies.  Family History  Problem Relation Age of Onset   Colon polyps Mother    Hypertension Mother    Colon cancer Mother 41   Colon polyps Father    Heart attack Father    Diabetes Mellitus I Father  Kidney failure Father    Thyroid cancer Sister    Fibromyalgia Sister    Kidney disease Brother    Breast cancer Maternal Aunt    Breast cancer Cousin    Esophageal cancer Neg Hx    Stomach cancer Neg Hx    Rectal cancer Neg Hx    Sleep apnea Neg Hx     Social History   Socioeconomic History   Marital status: Married    Spouse name: Not on file   Number of children: Not on file   Years of education: Not on file   Highest education level: Not on file  Occupational History   Not on file  Tobacco Use   Smoking status: Never    Passive exposure: Never   Smokeless tobacco: Never  Vaping Use   Vaping Use: Never used  Substance and Sexual Activity   Alcohol use: Yes    Alcohol/week: 3.0 standard drinks of alcohol    Types: 3 Standard drinks or equivalent per week    Comment: social   Drug use: Never   Sexual activity: Yes    Birth control/protection: Surgical    Comment: 1st intercourse 52 yo-More than  5 partners  Other Topics Concern   Not on file  Social History Narrative   Not on file   Social Determinants of Health   Financial Resource Strain: Not on file  Food Insecurity: Not on file  Transportation Needs: Not on file  Physical Activity: Not on file  Stress: Not on file  Social Connections: Not on file  Intimate Partner Violence: Not on file    Review of Systems  All other systems reviewed and are negative.   PHYSICAL EXAMINATION:    There were no vitals taken for this visit.    General appearance: alert, cooperative and appears stated age  2. Encounter for monitoring postmenopausal estrogen replacement therapy Overall doing really well, still waking up 2 x a night with mild sweats. Will increase her dose of ERT.  - estradiol (VIVELLE-DOT) 0.05 MG/24HR patch; Place 1 patch (0.05 mg total) onto the skin 2 (two) times a week.  Dispense: 24 patch; Refill: 3

## 2022-04-17 ENCOUNTER — Other Ambulatory Visit: Payer: Self-pay

## 2022-04-18 ENCOUNTER — Other Ambulatory Visit (HOSPITAL_COMMUNITY): Payer: Self-pay

## 2022-04-18 ENCOUNTER — Encounter (HOSPITAL_COMMUNITY): Payer: Self-pay

## 2022-04-19 ENCOUNTER — Other Ambulatory Visit (HOSPITAL_COMMUNITY): Payer: Self-pay

## 2022-04-20 ENCOUNTER — Other Ambulatory Visit (HOSPITAL_COMMUNITY): Payer: Self-pay

## 2022-04-20 ENCOUNTER — Ambulatory Visit (INDEPENDENT_AMBULATORY_CARE_PROVIDER_SITE_OTHER): Payer: 59 | Admitting: Obstetrics and Gynecology

## 2022-04-20 ENCOUNTER — Encounter: Payer: Self-pay | Admitting: Obstetrics and Gynecology

## 2022-04-20 VITALS — BP 132/84 | HR 71 | Ht 69.0 in | Wt 226.0 lb

## 2022-04-20 DIAGNOSIS — Z79890 Hormone replacement therapy (postmenopausal): Secondary | ICD-10-CM

## 2022-04-20 DIAGNOSIS — Z5181 Encounter for therapeutic drug level monitoring: Secondary | ICD-10-CM | POA: Diagnosis not present

## 2022-04-20 MED ORDER — ESTRADIOL 0.05 MG/24HR TD PTTW
1.0000 | MEDICATED_PATCH | TRANSDERMAL | 3 refills | Status: DC
Start: 2022-04-23 — End: 2023-05-23
  Filled 2022-04-20: qty 24, 84d supply, fill #0
  Filled 2022-06-24 – 2022-07-23 (×2): qty 24, 84d supply, fill #1
  Filled 2022-12-02: qty 24, 84d supply, fill #2
  Filled 2023-02-20 – 2023-02-25 (×2): qty 24, 84d supply, fill #3

## 2022-05-29 ENCOUNTER — Other Ambulatory Visit (HOSPITAL_COMMUNITY): Payer: Self-pay

## 2022-05-29 ENCOUNTER — Other Ambulatory Visit: Payer: Self-pay | Admitting: Emergency Medicine

## 2022-05-29 DIAGNOSIS — F5104 Psychophysiologic insomnia: Secondary | ICD-10-CM

## 2022-05-29 MED ORDER — ZOLPIDEM TARTRATE 10 MG PO TABS
10.0000 mg | ORAL_TABLET | Freq: Every evening | ORAL | 1 refills | Status: DC | PRN
  Filled 2022-05-29: qty 15, 15d supply, fill #0
  Filled 2022-06-24: qty 15, 15d supply, fill #1

## 2022-06-07 ENCOUNTER — Other Ambulatory Visit (HOSPITAL_COMMUNITY): Payer: Self-pay

## 2022-06-07 MED ORDER — SODIUM FLUORIDE 1.1 % DT PSTE
1.0000 | PASTE | Freq: Two times a day (BID) | DENTAL | 12 refills | Status: DC
  Filled 2022-06-07: qty 100, 15d supply, fill #0
  Filled 2022-06-24: qty 100, 15d supply, fill #1
  Filled 2022-07-10 – 2022-07-26 (×2): qty 100, 15d supply, fill #2
  Filled 2022-12-18: qty 100, 15d supply, fill #3
  Filled 2023-01-20: qty 100, 15d supply, fill #4
  Filled 2023-02-20: qty 100, 15d supply, fill #5

## 2022-06-26 ENCOUNTER — Other Ambulatory Visit (HOSPITAL_COMMUNITY): Payer: Self-pay

## 2022-06-28 ENCOUNTER — Other Ambulatory Visit: Payer: Self-pay

## 2022-07-10 ENCOUNTER — Other Ambulatory Visit (HOSPITAL_COMMUNITY): Payer: Self-pay

## 2022-07-10 ENCOUNTER — Encounter: Payer: Self-pay | Admitting: Emergency Medicine

## 2022-07-10 ENCOUNTER — Ambulatory Visit (INDEPENDENT_AMBULATORY_CARE_PROVIDER_SITE_OTHER): Payer: 59 | Admitting: Emergency Medicine

## 2022-07-10 VITALS — BP 138/86 | HR 88 | Temp 97.9°F | Ht 69.0 in | Wt 223.0 lb

## 2022-07-10 DIAGNOSIS — R29818 Other symptoms and signs involving the nervous system: Secondary | ICD-10-CM

## 2022-07-10 DIAGNOSIS — F411 Generalized anxiety disorder: Secondary | ICD-10-CM | POA: Insufficient documentation

## 2022-07-10 DIAGNOSIS — F5104 Psychophysiologic insomnia: Secondary | ICD-10-CM | POA: Diagnosis not present

## 2022-07-10 DIAGNOSIS — I10 Essential (primary) hypertension: Secondary | ICD-10-CM | POA: Diagnosis not present

## 2022-07-10 MED ORDER — DULOXETINE HCL 30 MG PO CPEP
30.0000 mg | ORAL_CAPSULE | Freq: Every day | ORAL | 3 refills | Status: DC
Start: 2022-07-10 — End: 2023-04-10
  Filled 2022-07-10: qty 30, 30d supply, fill #0

## 2022-07-10 MED ORDER — ZOLPIDEM TARTRATE 10 MG PO TABS
10.0000 mg | ORAL_TABLET | Freq: Every evening | ORAL | 1 refills | Status: DC | PRN
Start: 2022-07-10 — End: 2022-09-12
  Filled 2022-07-10: qty 20, 20d supply, fill #0
  Filled 2022-08-15: qty 20, 20d supply, fill #1

## 2022-07-10 NOTE — Assessment & Plan Note (Signed)
BP Readings from Last 3 Encounters:  07/10/22 138/86  04/20/22 132/84  03/20/22 124/80  Well-controlled hypertension Continue amlodipine 10 mg daily.  Has been taking it every other day.

## 2022-07-10 NOTE — Assessment & Plan Note (Signed)
Much improved with Ambien 10 mg at bedtime as needed.

## 2022-07-10 NOTE — Patient Instructions (Signed)

## 2022-07-10 NOTE — Assessment & Plan Note (Signed)
Was able to follow-up with neurologist Recommended to get sleep studies Still has to schedule test

## 2022-07-10 NOTE — Progress Notes (Signed)
Deanna Cook 52 y.o.   Chief Complaint  Patient presents with   Medical Management of Chronic Issues    F/u appt, patient wants to talk about her sleep, not sleeping well   Patient states she may be having some anxiety issues, unsure     HISTORY OF PRESENT ILLNESS: This is a 52 y.o. female here for follow-up of chronic medical conditions Has history of chronic insomnia.  Ambien working very well. Has some anxiety issues.  Known trigger is work related. Anxiety attacks interfering with work efficiency. No other complaints or medical concerns today.  HPI   Prior to Admission medications   Medication Sig Start Date End Date Taking? Authorizing Provider  amLODipine (NORVASC) 10 MG tablet Take 1 tablet (10 mg total) by mouth daily. Patient taking differently: Take 10 mg by mouth every other day. 08/21/21  Yes Maverick Patman, Eilleen Kempf, MD  ascorbic acid (VITAMIN C) 500 MG tablet Take by mouth. 06/04/19  Yes [provider]  Cyanocobalamin (B-12) 1000 MCG TABS Take 1 tablet by mouth as needed.   Yes [provider]  DULoxetine (CYMBALTA) 30 MG capsule Take 1 capsule (30 mg total) by mouth daily. 07/10/22  Yes Miniya Miguez, Eilleen Kempf, MD  estradiol (VIVELLE-DOT) 0.05 MG/24HR patch Place 1 patch (0.05 mg total) onto the skin 2 (two) times a week. 04/23/22  Yes Romualdo Bolk, MD  fexofenadine (ALLEGRA) 180 MG tablet Take 180 mg by mouth as needed.   Yes [provider]  fluticasone (FLONASE) 50 MCG/ACT nasal spray Place into both nostrils as needed.   Yes [provider]  Multiple Vitamin (MULTIVITAMIN) capsule Take 1 capsule by mouth as needed.   Yes [provider]  Sodium Fluoride 1.1 % PSTE Brush with paste twice daily; morning and at night 06/07/22  Yes   Vitamin D, Ergocalciferol, (DRISDOL) 1.25 MG (50000 UNIT) CAPS capsule Take 1 capsule (50,000 Units total) by mouth every 7 (seven) days. 01/08/22  Yes Chantilly Linskey, Eilleen Kempf, MD  zolpidem  (AMBIEN) 10 MG tablet Take 1 tablet (10 mg total) by mouth at bedtime as needed for sleep. 07/10/22   Georgina Quint, MD    No Known Allergies  Patient Active Problem List   Diagnosis Date Noted   Chronic insomnia 01/08/2022   Exhaustion 01/08/2022   Sjogren's syndrome (HCC) 01/08/2022   Chronic foot pain, left 01/08/2022   Suspected sleep apnea 01/08/2022   Protrusion of lumbar intervertebral disc 09/19/2021   Positive ANA (antinuclear antibody) 10/21/2020   Chronic pain of right knee 10/21/2020   Essential hypertension 02/11/2019   Refusal of blood transfusions as patient is Jehovah's Witness 08/07/2018   Seasonal allergies 08/07/2018    Past Medical History:  Diagnosis Date   Allergic rhinitis    Allergy    Fibroid    Hypertension     Past Surgical History:  Procedure Laterality Date   ABDOMINAL HYSTERECTOMY     [facility]-2014   APPENDECTOMY  1989   COLONOSCOPY  2005   about 20 years ago in [place]-normal exam   EYE SURGERY     Hole in retina    Social History   Socioeconomic History   Marital status: Married    Spouse name: Not on file   Number of children: Not on file   Years of education: Not on file   Highest education level: Not on file  Occupational History   Not on file  Tobacco Use   Smoking status: Never    Passive  exposure: Never   Smokeless tobacco: Never  Vaping Use   Vaping Use: Never used  Substance and Sexual Activity   Alcohol use: Yes    Alcohol/week: 3.0 standard drinks of alcohol    Types: 3 Standard drinks or equivalent per week    Comment: social   Drug use: Never   Sexual activity: Yes    Birth control/protection: Surgical    Comment: 1st intercourse 52 yo-More than 5 partners  Other Topics Concern   Not on file  Social History Narrative   Not on file   Social Determinants of Health   Financial Resource Strain: Not on file  Food Insecurity: Not on file  Transportation Needs: Not on file  Physical Activity: Not on  file  Stress: Not on file  Social Connections: Not on file  Intimate Partner Violence: Not on file    Family History  Problem Relation Age of Onset   Colon polyps Mother    Hypertension Mother    Colon cancer Mother 71   Colon polyps Father    Heart attack Father    Diabetes Mellitus I Father    Kidney failure Father    Thyroid cancer Sister    Fibromyalgia Sister    Kidney disease Brother    Breast cancer Maternal Aunt    Breast cancer Cousin    Esophageal cancer Neg Hx    Stomach cancer Neg Hx    Rectal cancer Neg Hx    Sleep apnea Neg Hx      Review of Systems  Constitutional: Negative.  Negative for chills and fever.  HENT: Negative.  Negative for congestion and sore throat.   Respiratory: Negative.  Negative for cough and shortness of breath.   Cardiovascular: Negative.  Negative for chest pain and palpitations.  Gastrointestinal:  Negative for abdominal pain, nausea and vomiting.  Genitourinary: Negative.  Negative for dysuria and hematuria.  Skin: Negative.  Negative for rash.  Neurological: Negative.  Negative for dizziness and headaches.  Psychiatric/Behavioral:  The patient is nervous/anxious and has insomnia.   All other systems reviewed and are negative.   Vitals:   07/10/22 1431  BP: 138/86  Pulse: 88  Temp: 97.9 F (36.6 C)  SpO2: 97%    Physical Exam Vitals reviewed.  Constitutional:      Appearance: Normal appearance.  HENT:     Head: Normocephalic.  Eyes:     Extraocular Movements: Extraocular movements intact.     Pupils: Pupils are equal, round, and reactive to light.  Cardiovascular:     Rate and Rhythm: Normal rate and regular rhythm.     Pulses: Normal pulses.     Heart sounds: Normal heart sounds.  Pulmonary:     Effort: Pulmonary effort is normal.     Breath sounds: Normal breath sounds.  Musculoskeletal:     Cervical back: No tenderness.  Lymphadenopathy:     Cervical: No cervical adenopathy.  Skin:    General: Skin is  warm and dry.     Capillary Refill: Capillary refill takes less than 2 seconds.  Neurological:     General: No focal deficit present.     Mental Status: She is alert and oriented to person, place, and time.  Psychiatric:        Mood and Affect: Mood normal.        Behavior: Behavior normal.      ASSESSMENT & PLAN: A total of 44 minutes was spent with the patient and counseling/coordination of care  regarding preparing for this visit, review of most recent office visit notes, review of chronic medical conditions under management, review of all medications, diagnosis of generalized anxiety disorder and management including medication, prognosis, documentation, and need for follow-up.  Problem List Items Addressed This Visit       Cardiovascular and Mediastinum   Essential hypertension    BP Readings from Last 3 Encounters:  07/10/22 138/86  04/20/22 132/84  03/20/22 124/80  Well-controlled hypertension Continue amlodipine 10 mg daily.  Has been taking it every other day.         Other   Chronic insomnia    Much improved with Ambien 10 mg at bedtime as needed.      Relevant Medications   zolpidem (AMBIEN) 10 MG tablet   Suspected sleep apnea    Was able to follow-up with neurologist Recommended to get sleep studies Still has to schedule test      Generalized anxiety disorder - Primary    Presently active and affecting quality of life Recommend trial of Cymbalta 30 mg daily for at least 2 weeks      Relevant Medications   DULoxetine (CYMBALTA) 30 MG capsule   Patient Instructions  Generalized Anxiety Disorder, Adult Generalized anxiety disorder (GAD) is a mental health condition. Unlike normal worries, anxiety related to GAD is not triggered by a specific event. These worries do not fade or get better with time. GAD interferes with relationships, work, and school. GAD symptoms can vary from mild to severe. People with severe GAD can have intense waves of anxiety with  physical symptoms that are similar to panic attacks. What are the causes? The exact cause of GAD is not known, but the following are believed to have an impact: Differences in natural brain chemicals. Genes passed down from parents to children. Differences in the way threats are perceived. Development and stress during childhood. Personality. What increases the risk? The following factors may make you more likely to develop this condition: Being female. Having a family history of anxiety disorders. Being very shy. Experiencing very stressful life events, such as the death of a loved one. Having a very stressful family environment. What are the signs or symptoms? People with GAD often worry excessively about many things in their lives, such as their health and family. Symptoms may also include: Mental and emotional symptoms: Worrying excessively about natural disasters. Fear of being late. Difficulty concentrating. Fears that others are judging your performance. Physical symptoms: Fatigue. Headaches, muscle tension, muscle twitches, trembling, or feeling shaky. Feeling like your heart is pounding or beating very fast. Feeling out of breath or like you cannot take a deep breath. Having trouble falling asleep or staying asleep, or experiencing restlessness. Sweating. Nausea, diarrhea, or irritable bowel syndrome (IBS). Behavioral symptoms: Experiencing erratic moods or irritability. Avoidance of new situations. Avoidance of people. Extreme difficulty making decisions. How is this diagnosed? This condition is diagnosed based on your symptoms and medical history. You will also have a physical exam. Your health care provider may perform tests to rule out other possible causes of your symptoms. To be diagnosed with GAD, a person must have anxiety that: Is out of his or her control. Affects several different aspects of his or her life, such as work and relationships. Causes distress  that makes him or her unable to take part in normal activities. Includes at least three symptoms of GAD, such as restlessness, fatigue, trouble concentrating, irritability, muscle tension, or sleep problems. Before your health care  provider can confirm a diagnosis of GAD, these symptoms must be present more days than they are not, and they must last for 6 months or longer. How is this treated? This condition may be treated with: Medicine. Antidepressant medicine is usually prescribed for long-term daily control. Anti-anxiety medicines may be added in severe cases, especially when panic attacks occur. Talk therapy (psychotherapy). Certain types of talk therapy can be helpful in treating GAD by providing support, education, and guidance. Options include: Cognitive behavioral therapy (CBT). People learn coping skills and self-calming techniques to ease their physical symptoms. They learn to identify unrealistic thoughts and behaviors and to replace them with more appropriate thoughts and behaviors. Acceptance and commitment therapy (ACT). This treatment teaches people how to be mindful as a way to cope with unwanted thoughts and feelings. Biofeedback. This process trains you to manage your body's response (physiological response) through breathing techniques and relaxation methods. You will work with a therapist while machines are used to monitor your physical symptoms. Stress management techniques. These include yoga, meditation, and exercise. A mental health specialist can help determine which treatment is best for you. Some people see improvement with one type of therapy. However, other people require a combination of therapies. Follow these instructions at home: Lifestyle Maintain a consistent routine and schedule. Anticipate stressful situations. Create a plan and allow extra time to work with your plan. Practice stress management or self-calming techniques that you have learned from your therapist  or your health care provider. Exercise regularly and spend time outdoors. Eat a healthy diet that includes plenty of vegetables, fruits, whole grains, low-fat dairy products, and lean protein. Do not eat a lot of foods that are high in fat, added sugar, or salt (sodium). Drink plenty of water. Avoid alcohol. Alcohol can increase anxiety. Avoid caffeine and certain over-the-counter cold medicines. These may make you feel worse. Ask your pharmacist which medicines to avoid. General instructions Take over-the-counter and prescription medicines only as told by your health care provider. Understand that you are likely to have setbacks. Accept this and be kind to yourself as you persist to take better care of yourself. Anticipate stressful situations. Create a plan and allow extra time to work with your plan. Recognize and accept your accomplishments, even if you judge them as small. Spend time with people who care about you. Keep all follow-up visits. This is important. Where to find more information [employer] of Mental Health: http://www.maynard.net/ Substance Abuse and Mental Health Services: SkateOasis.com.pt Contact a health care provider if: Your symptoms do not get better. Your symptoms get worse. You have signs of depression, such as: A persistently sad or irritable mood. Loss of enjoyment in activities that used to bring you joy. Change in weight or eating. Changes in sleeping habits. Get help right away if: You have thoughts about hurting yourself or others. If you ever feel like you may hurt yourself or others, or have thoughts about taking your own life, get help right away. Go to your nearest emergency department or: Call your local emergency services (911 in the [place] Call a suicide crisis helpline, such as the National Suicide Prevention Lifeline at (475)613-9040 or 988 in the [place] This is open 24 hours a day in the [place] Text the Crisis Text Line at 8176580934 (in the  [place] Summary Generalized anxiety disorder (GAD) is a mental health condition that involves worry that is not triggered by a specific event. People with GAD often worry excessively about many things in their  lives, such as their health and family. GAD may cause symptoms such as restlessness, trouble concentrating, sleep problems, frequent sweating, nausea, diarrhea, headaches, and trembling or muscle twitching. A mental health specialist can help determine which treatment is best for you. Some people see improvement with one type of therapy. However, other people require a combination of therapies. This information is not intended to replace advice given to you by your health care provider. Make sure you discuss any questions you have with your health care provider. Document Revised: 08/10/2020 Document Reviewed: 05/08/2020 Elsevier Patient Education  2024 Elsevier Inc.     Edwina Barth, MD [facility]

## 2022-07-10 NOTE — Assessment & Plan Note (Signed)
Presently active and affecting quality of life Recommend trial of Cymbalta 30 mg daily for at least 2 weeks

## 2022-07-16 ENCOUNTER — Other Ambulatory Visit: Payer: Self-pay | Admitting: Emergency Medicine

## 2022-07-16 ENCOUNTER — Encounter: Payer: Self-pay | Admitting: Emergency Medicine

## 2022-07-16 DIAGNOSIS — Z0000 Encounter for general adult medical examination without abnormal findings: Secondary | ICD-10-CM

## 2022-07-17 NOTE — Telephone Encounter (Signed)
Okay. Thank you.

## 2022-07-19 ENCOUNTER — Ambulatory Visit
Admission: RE | Admit: 2022-07-19 | Discharge: 2022-07-19 | Disposition: A | Discharge status: Home / Self Care | Payer: 59 | Source: Ambulatory Visit | Attending: Emergency Medicine | Admitting: Emergency Medicine

## 2022-07-19 DIAGNOSIS — Z0000 Encounter for general adult medical examination without abnormal findings: Secondary | ICD-10-CM

## 2022-07-19 DIAGNOSIS — Z1231 Encounter for screening mammogram for malignant neoplasm of breast: Secondary | ICD-10-CM | POA: Diagnosis not present

## 2022-07-23 ENCOUNTER — Other Ambulatory Visit: Payer: Self-pay | Admitting: Emergency Medicine

## 2022-07-23 ENCOUNTER — Other Ambulatory Visit (HOSPITAL_COMMUNITY): Payer: Self-pay

## 2022-07-23 DIAGNOSIS — R928 Other abnormal and inconclusive findings on diagnostic imaging of breast: Secondary | ICD-10-CM

## 2022-07-24 ENCOUNTER — Ambulatory Visit: Payer: 59 | Admitting: Dermatology

## 2022-07-24 ENCOUNTER — Other Ambulatory Visit (HOSPITAL_COMMUNITY): Payer: Self-pay

## 2022-07-25 ENCOUNTER — Ambulatory Visit: Payer: 59 | Admitting: Neurology

## 2022-07-25 DIAGNOSIS — R030 Elevated blood-pressure reading, without diagnosis of hypertension: Secondary | ICD-10-CM

## 2022-07-25 DIAGNOSIS — G4733 Obstructive sleep apnea (adult) (pediatric): Secondary | ICD-10-CM | POA: Diagnosis not present

## 2022-07-25 DIAGNOSIS — R0683 Snoring: Secondary | ICD-10-CM

## 2022-07-25 DIAGNOSIS — E66811 Obesity, class 1: Secondary | ICD-10-CM

## 2022-07-25 DIAGNOSIS — E669 Obesity, unspecified: Secondary | ICD-10-CM

## 2022-07-25 DIAGNOSIS — Z9189 Other specified personal risk factors, not elsewhere classified: Secondary | ICD-10-CM

## 2022-07-25 DIAGNOSIS — R351 Nocturia: Secondary | ICD-10-CM

## 2022-07-25 DIAGNOSIS — R5383 Other fatigue: Secondary | ICD-10-CM

## 2022-07-26 ENCOUNTER — Other Ambulatory Visit (HOSPITAL_COMMUNITY): Payer: Self-pay

## 2022-07-26 ENCOUNTER — Ambulatory Visit
Admission: RE | Admit: 2022-07-26 | Discharge: 2022-07-26 | Disposition: A | Discharge status: Home / Self Care | Payer: 59 | Source: Ambulatory Visit | Attending: Emergency Medicine | Admitting: Emergency Medicine

## 2022-07-26 DIAGNOSIS — N6001 Solitary cyst of right breast: Secondary | ICD-10-CM | POA: Diagnosis not present

## 2022-07-26 DIAGNOSIS — R928 Other abnormal and inconclusive findings on diagnostic imaging of breast: Secondary | ICD-10-CM

## 2022-08-01 NOTE — Addendum Note (Signed)
Addended by: Huston Foley on: 08/01/2022 07:17 PM   Modules accepted: Orders

## 2022-08-01 NOTE — Progress Notes (Signed)
See procedure note.

## 2022-08-01 NOTE — Procedures (Signed)
[facility]  HOME SLEEP TEST (Watch PAT) REPORT  -  Mail-out Device  STUDY DATE: 07/24/2022  DOB: Oct 17, 1970  MRN: [id number]  ORDERING CLINICIAN: Huston Foley, MD, PhD   REFERRING CLINICIAN: Georgina Quint, MD   CLINICAL INFORMATION/HISTORY: 52 year old female with an underlying medical history of Sjogren's syndrome, chronic right knee pain and left foot pain, allergic rhinitis, thyroid disease, hypertension, and obesity, who reports chronic difficulty initiating and maintaining sleep as well as snoring and daytime somnolence.   Epworth sleepiness score: 4/24.  BMI: 33.3 kg/m  FINDINGS:   Sleep Summary:   Total Recording Time (hours, min): 7 hours, 10 min  Total Sleep Time (hours, min):  6 hours, 15 min  Percent REM (%):    32.1%   Respiratory Indices:   Calculated pAHI (per hour):  20.6/hour         REM pAHI:    37.3/hour       NREM pAHI: 12.7/hour  Central pAHI: 0.3/hour  Oxygen Saturation Statistics:    Oxygen Saturation (%) Mean: 96%   Minimum oxygen saturation (%):                 89%   O2 Saturation Range (%): 89 - 99%    O2 Saturation (minutes) <=88%: 0 min  Pulse Rate Statistics:   Pulse Mean (bpm):    71/min    Pulse Range (58 - 109/min)   IMPRESSION: OSA (obstructive sleep apnea)   RECOMMENDATION:  This home sleep test demonstrates moderate obstructive sleep apnea - by number of events - with a total AHI of 20.6/hour and O2 nadir of 89%.  Intermittent mild to moderate snoring was detected, at times in the louder range. Treatment with a positive airway pressure (PAP) device is recommended. The patient will be advised to proceed with an autoPAP titration/trial at home for now. A full night titration study may be considered to optimize treatment settings, monitor proper oxygen saturations and aid with improvement of tolerance and adherence, if needed down the road. Alternative treatment options may include a dental device  through dentistry or orthodontics in selected patients or Inspire (hypoglossal nerve stimulator) in carefully selected patients (meeting inclusion criteria).  Concomitant weight loss is recommended (where clinically appropriate). Please note that untreated obstructive sleep apnea may carry additional perioperative morbidity. Patients with significant obstructive sleep apnea should receive perioperative PAP therapy and the surgeons and particularly the anesthesiologist should be informed of the diagnosis and the severity of the sleep disordered breathing. The patient should be cautioned not to drive, work at heights, or operate dangerous or heavy equipment when tired or sleepy. Review and reiteration of good sleep hygiene measures should be pursued with any patient. Other causes of the patient's symptoms, including circadian rhythm disturbances, an underlying mood disorder, medication effect and/or an underlying medical problem cannot be ruled out based on this test. Clinical correlation is recommended.  The patient and her referring provider will be notified of the test results. The patient will be seen in follow up in sleep clinic at [facility].  I certify that I have reviewed the raw data recording prior to the issuance of this report in accordance with the standards of the American Academy of Sleep Medicine (AASM).  INTERPRETING PHYSICIAN:   Huston Foley, MD, PhD Medical Director, [facility]) Diplomat, ABPN (Neurology and Sleep)   [facility] [address] [place], [place] [address] [phone]

## 2022-08-07 ENCOUNTER — Telehealth: Payer: Self-pay

## 2022-08-07 NOTE — Telephone Encounter (Signed)
Deanna Cook, CMA  Kathe Becton; [place], Andreas Ohm, Efraim Kaufmann New orders have been placed for the above pt, DOB: 10/04/1970 Thank

## 2022-08-07 NOTE — Telephone Encounter (Signed)
-----   Message from Huston Foley, MD sent at 08/01/2022  7:17 PM EDT ----- Patient referred by PCP, seen by me on 02/21/2022, patient had a HST on 07/24/2022.    Please call and notify the patient that the recent home sleep test showed obstructive sleep apnea in the moderate range. I recommend treatment in the form of autoPAP, which means, that we don't have to bring her in for a sleep study with CPAP, but will let her start using a so called autoPAP machine at home, which is a CPAP-like machine with self-adjusting pressures. We will send the order to a local DME company (of her choice, or as per insurance requirement). The DME representative will fit her with a mask, educate her on how to use the machine, how to put the mask on, etc. I have placed an order in the chart. Please send the order, talk to patient, send report to referring MD. We will need a FU in sleep clinic for 10 weeks post-PAP set up, please arrange that with me or one of our NPs. Also reinforce the need for compliance with treatment. Thanks,   Huston Foley, MD, PhD [facility])

## 2022-08-07 NOTE — Telephone Encounter (Signed)
I called pt. I advised pt that Dr. Frances Furbish reviewed their sleep study results and found that pt madoerate OSA. Dr. Frances Furbish recommends that pt starts autopap. I reviewed PAP compliance expectations with the pt. Pt is agreeable to starting a CPAP. I advised pt that an order will be sent to a DME, [employer], and [employer] will call the pt within about one week after they file with the pt's insurance. [employer] will show the pt how to use the machine, fit for masks, and troubleshoot the CPAP if needed. A follow up appt was made for insurance purposes with [facility] 10/22/23. Pt verbalized understanding to arrive 15 minutes early and bring their CPAP. Pt verbalized understanding of results. Pt had no questions at this time but was encouraged to call back if questions arise. I have sent the order to [employer] and have received confirmation that they have received the order. Results sent to ref provider

## 2022-08-08 NOTE — Telephone Encounter (Signed)
New, Almon Register, CMA; Kathe Becton; New, Andreas Ohm, Melissa Received, thank you!       Previous Messages    ----- Message ----- From: Bobbye Morton, CMA Sent: 08/07/2022  11:40 AM EDT To: Penni Homans; Santina Evans; * Subject: autopap                                        New orders have been placed for the above pt, DOB: 03/08/1970 Thanks

## 2022-08-16 ENCOUNTER — Other Ambulatory Visit: Payer: Self-pay

## 2022-09-12 ENCOUNTER — Other Ambulatory Visit: Payer: Self-pay | Admitting: Emergency Medicine

## 2022-09-12 ENCOUNTER — Other Ambulatory Visit: Payer: Self-pay

## 2022-09-12 ENCOUNTER — Other Ambulatory Visit (HOSPITAL_COMMUNITY): Payer: Self-pay

## 2022-09-12 DIAGNOSIS — F5104 Psychophysiologic insomnia: Secondary | ICD-10-CM

## 2022-09-12 MED ORDER — ZOLPIDEM TARTRATE 10 MG PO TABS
10.0000 mg | ORAL_TABLET | Freq: Every evening | ORAL | 1 refills | Status: DC | PRN
  Filled 2022-09-12: qty 20, 20d supply, fill #0
  Filled 2022-10-09: qty 20, 20d supply, fill #1

## 2022-10-09 ENCOUNTER — Other Ambulatory Visit (HOSPITAL_COMMUNITY): Payer: Self-pay

## 2022-10-09 MED ORDER — AMOXICILLIN 500 MG PO CAPS
500.0000 mg | ORAL_CAPSULE | Freq: Three times a day (TID) | ORAL | 0 refills | Status: AC
  Filled 2022-10-09: qty 30, 10d supply, fill #0

## 2022-10-16 NOTE — Progress Notes (Deleted)
PATIENT: Deanna Cook DOB: Dec 25, 1970  REASON FOR VISIT: follow up HISTORY FROM: patient  Virtual Visit via Telephone Note  I connected with [name] on 10/16/22 at  51:45 AM EDT by telephone and verified that I am speaking with the correct person using two identifiers.   I discussed the limitations, risks, security and privacy concerns of performing an evaluation and management service by telephone and the availability of in person appointments. I also discussed with the patient that there may be a patient responsible charge related to this service. The patient expressed understanding and agreed to proceed.   History of Present Illness:  10/22/22 ALL:  Deanna Cook is a 52 y.o. female here today for follow up for OSA on CPAP.  She was seen in consult with Dr Frances Furbish 01/2022 for difficulty maintaining sleep, snoring and daytime somnolence. HST 06/2022 showed moderate obstructive sleep apnea - by number of events - with a total AHI of 20.6/hour and O2 nadir of 89%. AutoPAP was ordered. Since,     HISTORY: (copied from Dr Teofilo Pod previous note)  Dear Dr. Alvy Bimler,   I saw your patient, Deanna Cook, upon your kind request in my sleep clinic today for initial consultation of her sleep disorder, in particular, concern for underlying obstructive sleep apnea.  The patient is unaccompanied today.  As you know, Ms. Vollmer is a 52 year old female with an underlying medical history of Sjogren's syndrome, chronic right knee pain and left foot pain, allergic rhinitis, thyroid disease, hypertension, and obesity, who reports chronic difficulty initiating and maintaining sleep as well as snoring and daytime somnolence.  Her Epworth sleepiness score is 4 out of 24, fatigue severity score is 41 out of 63.  Of note, her blood pressure is elevated today, she reports that she has not taken her blood pressure medication.  She denies any symptoms of chest pain, shortness of breath, blurry vision, or  headache at this time.  I reviewed your office visit note from 01/08/2022.  For the past year she has had difficulty maintaining sleep.  She often wakes up right around 3 or 3:30 AM and may not go back to sleep for at least a couple of hours.  Bedtime is generally around 11 PM and rise time between 6:30 AM and 7 AM.  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  She works Mondays, Tuesdays, Thursdays, and Fridays.  She lives with her husband, they have no children.  She has nocturia about once per average night, no recurrent morning headaches or nocturnal headaches typically.  She has a distant cousin with sleep apnea.  She has limited caffeine, usually 1 cup of coffee in the morning, occasional alcohol, usually on weekends, does not smoke.  She has a TV in her bedroom but does not have it on at night typically.  Over-the-counter medications and treatments she has tried for her sleep include a supplement called calm which made her jittery, melatonin ordid not help, sleepytime tea did not help.  She was given a prescription for Ambien 10 mg strength but has not tried it.  She was afraid to try it.   Observations/Objective:  Generalized: Well developed, in no acute distress  Mentation: Alert oriented to time, place, history taking. Follows all commands speech and language fluent   Assessment and Plan:  52 y.o. year old female  has a past medical history of Allergic rhinitis, Allergy, Fibroid, and Hypertension. here with  No diagnosis found.  No orders of the defined types were placed in this encounter.   No orders of the defined types were placed in this encounter.    Follow Up Instructions:  I discussed the assessment and treatment plan with the patient. The patient was provided an opportunity to ask questions and all were answered. The patient agreed with the plan and demonstrated an understanding of the instructions.   The patient was advised to call  back or seek an in-person evaluation if the symptoms worsen or if the condition fails to improve as anticipated.  I provided *** minutes of non-face-to-face time during this encounter. Patient located at their place of residence during Mychart visit. Provider is in the office.    Shawnie Dapper, NP

## 2022-10-18 ENCOUNTER — Encounter: Payer: Self-pay | Admitting: *Deleted

## 2022-10-22 ENCOUNTER — Telehealth: Payer: Self-pay | Admitting: Family Medicine

## 2022-10-22 ENCOUNTER — Telehealth: Payer: 59 | Admitting: Family Medicine

## 2022-10-22 DIAGNOSIS — G4733 Obstructive sleep apnea (adult) (pediatric): Secondary | ICD-10-CM

## 2022-10-22 NOTE — Telephone Encounter (Signed)
Sent Mychart msg informing pt of r/s needed- Amy out.

## 2022-11-08 ENCOUNTER — Other Ambulatory Visit: Payer: Self-pay | Admitting: Emergency Medicine

## 2022-11-08 DIAGNOSIS — F5104 Psychophysiologic insomnia: Secondary | ICD-10-CM

## 2022-11-10 MED ORDER — ASCORBIC ACID 500 MG PO TABS
500.0000 mg | ORAL_TABLET | Freq: Every day | ORAL | 1 refills | Status: DC
  Filled 2022-11-10 – 2023-02-20 (×3): qty 90, 90d supply, fill #0
  Filled 2023-06-05: qty 90, 90d supply, fill #1

## 2022-11-10 MED ORDER — ZOLPIDEM TARTRATE 10 MG PO TABS
10.0000 mg | ORAL_TABLET | Freq: Every evening | ORAL | 1 refills | Status: DC | PRN
Start: 2022-11-10 — End: 2023-01-20
  Filled 2022-11-10: qty 20, 20d supply, fill #0
  Filled 2022-12-18: qty 20, 20d supply, fill #1

## 2022-11-12 ENCOUNTER — Other Ambulatory Visit: Payer: Self-pay

## 2022-11-12 ENCOUNTER — Other Ambulatory Visit (HOSPITAL_COMMUNITY): Payer: Self-pay

## 2022-12-18 ENCOUNTER — Other Ambulatory Visit: Payer: Self-pay | Admitting: Emergency Medicine

## 2022-12-18 DIAGNOSIS — I10 Essential (primary) hypertension: Secondary | ICD-10-CM

## 2022-12-19 ENCOUNTER — Other Ambulatory Visit (HOSPITAL_COMMUNITY): Payer: Self-pay

## 2022-12-19 ENCOUNTER — Other Ambulatory Visit: Payer: Self-pay

## 2022-12-19 MED ORDER — AMLODIPINE BESYLATE 10 MG PO TABS
10.0000 mg | ORAL_TABLET | Freq: Every day | ORAL | 3 refills | Status: DC
  Filled 2022-12-19: qty 90, 90d supply, fill #0
  Filled 2023-04-20: qty 90, 90d supply, fill #1
  Filled 2023-08-15: qty 90, 90d supply, fill #2
  Filled 2023-09-11 – 2023-11-12 (×3): qty 90, 90d supply, fill #3

## 2023-01-20 ENCOUNTER — Other Ambulatory Visit: Payer: Self-pay | Admitting: Emergency Medicine

## 2023-01-20 DIAGNOSIS — F5104 Psychophysiologic insomnia: Secondary | ICD-10-CM

## 2023-01-21 ENCOUNTER — Other Ambulatory Visit (HOSPITAL_COMMUNITY): Payer: Self-pay

## 2023-01-21 ENCOUNTER — Other Ambulatory Visit: Payer: Self-pay

## 2023-01-21 MED ORDER — ZOLPIDEM TARTRATE 10 MG PO TABS
10.0000 mg | ORAL_TABLET | Freq: Every evening | ORAL | 1 refills | Status: DC | PRN
Start: 2023-01-21 — End: 2023-03-27
  Filled 2023-01-21: qty 20, 20d supply, fill #0
  Filled 2023-02-20: qty 20, 20d supply, fill #1

## 2023-01-28 ENCOUNTER — Other Ambulatory Visit (HOSPITAL_COMMUNITY): Payer: Self-pay

## 2023-02-20 ENCOUNTER — Other Ambulatory Visit: Payer: Self-pay

## 2023-02-20 ENCOUNTER — Other Ambulatory Visit (HOSPITAL_COMMUNITY): Payer: Self-pay

## 2023-02-25 ENCOUNTER — Other Ambulatory Visit (HOSPITAL_COMMUNITY): Payer: Self-pay

## 2023-02-28 ENCOUNTER — Other Ambulatory Visit (HOSPITAL_COMMUNITY): Payer: Self-pay

## 2023-02-28 MED ORDER — HYDROCODONE-ACETAMINOPHEN 5-325 MG PO TABS
1.0000 | ORAL_TABLET | ORAL | 0 refills | Status: DC | PRN
Start: 2023-02-28 — End: 2023-05-23
  Filled 2023-02-28: qty 12, 2d supply, fill #0

## 2023-02-28 MED ORDER — AMOXICILLIN 500 MG PO CAPS
500.0000 mg | ORAL_CAPSULE | Freq: Three times a day (TID) | ORAL | 0 refills | Status: DC
  Filled 2023-02-28: qty 30, 10d supply, fill #0

## 2023-03-08 NOTE — Progress Notes (Signed)
 Pt has appt scheduled with Dr. Sargardo about BP

## 2023-03-12 ENCOUNTER — Other Ambulatory Visit (HOSPITAL_COMMUNITY): Payer: Self-pay

## 2023-03-12 ENCOUNTER — Ambulatory Visit: Payer: 59 | Admitting: Emergency Medicine

## 2023-03-12 VITALS — BP 152/94 | HR 82 | Temp 98.4°F | Wt 222.0 lb

## 2023-03-12 DIAGNOSIS — E559 Vitamin D deficiency, unspecified: Secondary | ICD-10-CM | POA: Diagnosis not present

## 2023-03-12 DIAGNOSIS — M5432 Sciatica, left side: Secondary | ICD-10-CM | POA: Diagnosis not present

## 2023-03-12 DIAGNOSIS — R32 Unspecified urinary incontinence: Secondary | ICD-10-CM

## 2023-03-12 DIAGNOSIS — I10 Essential (primary) hypertension: Secondary | ICD-10-CM | POA: Diagnosis not present

## 2023-03-12 DIAGNOSIS — L918 Other hypertrophic disorders of the skin: Secondary | ICD-10-CM | POA: Diagnosis not present

## 2023-03-12 DIAGNOSIS — N3941 Urge incontinence: Secondary | ICD-10-CM | POA: Insufficient documentation

## 2023-03-12 DIAGNOSIS — F5104 Psychophysiologic insomnia: Secondary | ICD-10-CM | POA: Diagnosis not present

## 2023-03-12 MED ORDER — MELOXICAM 15 MG PO TABS
15.0000 mg | ORAL_TABLET | Freq: Every day | ORAL | 0 refills | Status: AC
Start: 2023-03-12 — End: 2023-03-28
  Filled 2023-03-12: qty 14, 14d supply, fill #0

## 2023-03-12 MED ORDER — VITAMIN D (ERGOCALCIFEROL) 1.25 MG (50000 UNIT) PO CAPS
50000.0000 [IU] | ORAL_CAPSULE | ORAL | 1 refills | Status: DC
  Filled 2023-03-12: qty 7, 49d supply, fill #0
  Filled 2023-06-05: qty 7, 49d supply, fill #1

## 2023-03-12 NOTE — Assessment & Plan Note (Signed)
Started about 3 months ago.  Presently active and affecting quality of life.  Needs orthopedic evaluation.  Referral placed today.  May benefit from physical therapy.  Recommend lumbar spine MRI.  Pain management discussed.  Recommend daily meloxicam 15 mg for 2 weeks.  Stop over-the-counter NSAIDs.

## 2023-03-12 NOTE — Assessment & Plan Note (Signed)
Much improved with Ambien 10 mg at bedtime as needed.

## 2023-03-12 NOTE — Patient Instructions (Signed)
Sciatica  Sciatica is pain, weakness, tingling, or loss of feeling (numbness) along the sciatic nerve. The sciatic nerve starts in the lower back and goes down the back of each leg. Sciatica usually affects one side of the body. Sciatica usually goes away on its own or with treatment. Sometimes, sciatica may come back. What are the causes? This condition happens when the sciatic nerve is pinched or has pressure put on it. This may be caused by: A disk in between the bones of the spine bulging out too far (herniated disk). Changes in the spinal disks due to aging. A condition that affects a muscle in the butt. Extra bone growth near the sciatic nerve. A break (fracture) of the area between your hip bones (pelvis). Pregnancy. Tumor. This is rare. What increases the risk? You are more likely to develop this condition if you: Play sports that put pressure or stress on the spine. Have poor strength and ease of movement (flexibility). Have had a back injury or back surgery. Sit for long periods of time. Do activities that involve bending or lifting over and over again. Are very overweight (obese). What are the signs or symptoms? Symptoms can vary from mild to very bad. They may include: Any of these problems in the lower back, leg, hip, or butt: Mild tingling, loss of feeling, or dull aches. A burning feeling. Sharp pains. Loss of feeling in the back of the calf or the sole of the foot. Leg weakness. Very bad back pain that makes it hard to move. These symptoms may get worse when you cough, sneeze, or laugh. They may also get worse when you sit or stand for long periods of time. How is this treated? This condition often gets better without any treatment. However, treatment may include: Changing or cutting back on physical activity when you have pain. Exercising, including strengthening and stretching. Putting ice or heat on the affected area. Shots of medicines to relieve pain and  swelling or to relax your muscles. Surgery. Follow these instructions at home: Medicines Take over-the-counter and prescription medicines only as told by your doctor. Ask your doctor if you should avoid driving or using machines while you are taking your medicine. Managing pain     If told, put ice on the affected area. To do this: Put ice in a plastic bag. Place a towel between your skin and the bag. Leave the ice on for 20 minutes, 2-3 times a day. If your skin turns bright red, take off the ice right away to prevent skin damage. The risk of skin damage is higher if you cannot feel pain, heat, or cold. If told, put heat on the affected area. Do this as often as told by your doctor. Use the heat source that your doctor tells you to use, such as a moist heat pack or a heating pad. Place a towel between your skin and the heat source. Leave the heat on for 20-30 minutes. If your skin turns bright red, take off the heat right away to prevent burns. The risk of burns is higher if you cannot feel pain, heat, or cold. Activity  Return to your normal activities when your doctor says that it is safe. Avoid activities that make your symptoms worse. Take short rests during the day. When you rest for a long time, do some physical activity or stretching between periods of rest. Avoid sitting for a long time without moving. Get up and move around at least one time each  hour. Do exercises and stretches as told by your doctor. Do not lift anything that is heavier than 10 lb (4.5 kg). Avoid lifting heavy things even when you do not have symptoms. Avoid lifting heavy things over and over. When you lift objects, always lift in a way that is safe for your body. To do this, you should: Bend your knees. Keep the object close to your body. Avoid twisting. General instructions Stay at a healthy weight. Wear comfortable shoes that support your feet. Avoid wearing high heels. Avoid sleeping on a mattress  that is too soft or too hard. You might have less pain if you sleep on a mattress that is firm enough to support your back. Contact a doctor if: Your pain is not controlled by medicine. Your pain does not get better. Your pain gets worse. Your pain lasts longer than 4 weeks. You lose weight without trying. Get help right away if: You cannot control when you pee (urinate) or poop (have a bowel movement). You have weakness in any of these areas and it gets worse: Lower back. The area between your hip bones. Butt. Legs. You have redness or swelling of your back. You have a burning feeling when you pee. Summary Sciatica is pain, weakness, tingling, or loss of feeling (numbness) along the sciatic nerve. This may include the lower back, legs, hips, and butt. This condition happens when the sciatic nerve is pinched or has pressure put on it. Treatment often includes rest, exercise, medicines, and putting ice or heat on the affected area. This information is not intended to replace advice given to you by your health care provider. Make sure you discuss any questions you have with your health care provider. Document Revised: 04/24/2021 Document Reviewed: 04/24/2021 [employer] Patient Education  2024 [employer].

## 2023-03-12 NOTE — Progress Notes (Signed)
 Deanna Cook 53 y.o.   Chief Complaint  Patient presents with   Numbness    Left side of leg stops at the knee numbness. Skin tag on the right inner thigh Has been going on for a while, pt states she not able to sleep on her left side of bottom now.     HISTORY OF PRESENT ILLNESS: This is a 53 y.o. female complaining of several thanks: 1.  Skin tag to inner upper right thigh.  Needs dermatology referral 2.  Left-sided lumbar pain radiating down back of left leg for the past 3 months.  Has been taking ibuprofen with some relief 3.  Urinary incontinence.  3 episodes in the last several months.  Most recent one 2 nights ago. No other complaints or medical concerns today.  HPI   Prior to Admission medications   Medication Sig Start Date End Date Taking? Authorizing Provider  amLODipine (NORVASC) 10 MG tablet Take 1 tablet (10 mg total) by mouth daily. 12/19/22  Yes Rodricus Candelaria, Eilleen Kempf, MD  ascorbic acid (VITAMIN C) 500 MG tablet Take 1 tablet (500 mg total) by mouth daily. 11/10/22  Yes Zamaya Rapaport, Eilleen Kempf, MD  Cyanocobalamin (B-12) 1000 MCG TABS Take 1 tablet by mouth as needed.   Yes [provider]  estradiol (VIVELLE-DOT) 0.05 MG/24HR patch Place 1 patch (0.05 mg total) onto the skin 2 (two) times a week. 04/23/22  Yes Romualdo Bolk, MD  fexofenadine (ALLEGRA) 180 MG tablet Take 180 mg by mouth as needed.   Yes [provider]  fluticasone (FLONASE) 50 MCG/ACT nasal spray Place into both nostrils as needed.   Yes [provider]  Multiple Vitamin (MULTIVITAMIN) capsule Take 1 capsule by mouth as needed.   Yes [provider]  Sodium Fluoride 1.1 % PSTE Brush with paste twice daily; morning and at night 06/07/22  Yes   Vitamin D, Ergocalciferol, (DRISDOL) 1.25 MG (50000 UNIT) CAPS capsule Take 1 capsule (50,000 Units total) by mouth every 7 (seven) days. 01/08/22  Yes Vertie Dibbern, Eilleen Kempf, MD  zolpidem (AMBIEN) 10 MG tablet Take 1 tablet  (10 mg total) by mouth at bedtime as needed for sleep. 01/21/23  Yes Madden Garron, Eilleen Kempf, MD  amoxicillin (AMOXIL) 500 MG capsule Take 2 capsules by mouth now, then take 1 capsule (500 mg total) by mouth 3 (three) times daily until all are taken. Patient not taking: Reported on 03/12/2023 02/28/23     DULoxetine (CYMBALTA) 30 MG capsule Take 1 capsule (30 mg total) by mouth daily. 07/10/22   Georgina Quint, MD  HYDROcodone-acetaminophen (NORCO/VICODIN) 5-325 MG tablet Take 1 tablet by mouth every 4 (four) to 6 (six) hours as needed for pain. Patient not taking: Reported on 03/12/2023 02/28/23       No Known Allergies  Patient Active Problem List   Diagnosis Date Noted   Generalized anxiety disorder 07/10/2022   Chronic insomnia 01/08/2022   Exhaustion 01/08/2022   Sjogren's syndrome (HCC) 01/08/2022   Chronic foot pain, left 01/08/2022   Suspected sleep apnea 01/08/2022   Protrusion of lumbar intervertebral disc 09/19/2021   Positive ANA (antinuclear antibody) 10/21/2020   Chronic pain of right knee 10/21/2020   Essential hypertension 02/11/2019   Refusal of blood transfusions as patient is Jehovah's Witness 08/07/2018   Seasonal allergies 08/07/2018    Past Medical History:  Diagnosis Date   Allergic rhinitis    Allergy    Fibroid    Hypertension     Past Surgical History:  Procedure Laterality Date   ABDOMINAL HYSTERECTOMY     [facility]-2014   APPENDECTOMY  1989   COLONOSCOPY  2005   about 20 years ago in [place]-normal exam   EYE SURGERY     Hole in retina    Social History   Socioeconomic History   Marital status: Married    Spouse name: Not on file   Number of children: Not on file   Years of education: Not on file   Highest education level: Bachelor's degree (e.g., BA, AB, BS)  Occupational History   Not on file  Tobacco Use   Smoking status: Never    Passive exposure: Never   Smokeless tobacco: Never  Vaping Use   Vaping status: Never Used   Substance and Sexual Activity   Alcohol use: Yes    Alcohol/week: 3.0 standard drinks of alcohol    Types: 3 Standard drinks or equivalent per week    Comment: social   Drug use: Never   Sexual activity: Yes    Birth control/protection: Surgical    Comment: 1st intercourse 53 yo-More than 5 partners  Other Topics Concern   Not on file  Social History Narrative   Not on file   Social Drivers of Health   Financial Resource Strain: Medium Risk (03/12/2023)   Overall Financial Resource Strain (CARDIA)    Difficulty of Paying Living Expenses: Somewhat hard  Food Insecurity: Food Insecurity Present (03/12/2023)   Hunger Vital Sign    Worried About Running Out of Food in the Last Year: Sometimes true    Ran Out of Food in the Last Year: Never true  Transportation Needs: No Transportation Needs (03/12/2023)   PRAPARE - Administrator, Civil Service (Medical): No    Lack of Transportation (Non-Medical): No  Physical Activity: Insufficiently Active (03/12/2023)   Exercise Vital Sign    Days of Exercise per Week: 4 days    Minutes of Exercise per Session: 20 min  Stress: Stress Concern Present (03/12/2023)   [employer] of Occupational Health - Occupational Stress Questionnaire    Feeling of Stress : Rather much  Social Connections: Moderately Integrated (03/12/2023)   Social Connection and Isolation Panel [NHANES]    Frequency of Communication with Friends and Family: Once a week    Frequency of Social Gatherings with Friends and Family: Once a week    Attends Religious Services: 1 to 4 times per year    Active Member of [employer] or Organizations: Yes    Attends Banker Meetings: 1 to 4 times per year    Marital Status: Married  Catering manager Violence: Not At Risk (03/08/2023)   Humiliation, Afraid, Rape, and Kick questionnaire    Fear of Current or Ex-Partner: No    Emotionally Abused: No    Physically Abused: No    Sexually Abused: No    Family  History  Problem Relation Age of Onset   Colon polyps Mother    Hypertension Mother    Colon cancer Mother 55   Colon polyps Father    Heart attack Father    Diabetes Mellitus I Father    Kidney failure Father    Thyroid cancer Sister    Fibromyalgia Sister    Breast cancer Maternal Aunt    Breast cancer Cousin    Breast cancer Cousin    Kidney disease Brother    Esophageal cancer Neg Hx    Stomach cancer Neg Hx    Rectal cancer Neg Hx  Sleep apnea Neg Hx      Review of Systems  Constitutional: Negative.   HENT: Negative.  Negative for congestion and sore throat.   Respiratory: Negative.  Negative for cough and shortness of breath.   Cardiovascular: Negative.  Negative for chest pain and palpitations.  Gastrointestinal:  Negative for abdominal pain, diarrhea, nausea and vomiting.  Genitourinary: Negative.  Negative for dysuria and hematuria.  Musculoskeletal:  Positive for back pain.  Skin:  Negative for rash.       Skin lesion  Neurological: Negative.  Negative for dizziness and headaches.  All other systems reviewed and are negative.   Vitals:   03/12/23 1558  BP: (!) 152/94  Pulse: 82  Temp: 98.4 F (36.9 C)  SpO2: 96%    Physical Exam Vitals reviewed.  Constitutional:      Appearance: Normal appearance.  HENT:     Head: Normocephalic.  Eyes:     Extraocular Movements: Extraocular movements intact.  Cardiovascular:     Rate and Rhythm: Normal rate.  Pulmonary:     Effort: Pulmonary effort is normal.  Skin:    General: Skin is warm and dry.  Neurological:     Mental Status: She is alert and oriented to person, place, and time.     Sensory: No sensory deficit.     Motor: No weakness.  Psychiatric:        Mood and Affect: Mood normal.        Behavior: Behavior normal.      ASSESSMENT & PLAN: A total of 40 minutes was spent with the patient and counseling/coordination of care regarding preparing for this visit, review of most recent office visit  notes, review of multiple chronic medical conditions and their management, diagnosis of sciatica and need for orthopedic evaluation and imaging with MRI of lumbar spine, pain management, review of all medications, review of most recent bloodwork results, review of health maintenance items, education on nutrition, prognosis, documentation, and need for follow up.   Problem List Items Addressed This Visit       Cardiovascular and Mediastinum   Essential hypertension   Well-controlled hypertension Continue amlodipine 10 mg daily.  Has been taking it every other day.          Nervous and Auditory   Left sided sciatica - Primary   Started about 3 months ago.  Presently active and affecting quality of life.  Needs orthopedic evaluation.  Referral placed today.  May benefit from physical therapy.  Recommend lumbar spine MRI.  Pain management discussed.  Recommend daily meloxicam 15 mg for 2 weeks.  Stop over-the-counter NSAIDs.      Relevant Medications   meloxicam (MOBIC) 15 MG tablet   Other Relevant Orders   Ambulatory referral to Orthopedic Surgery   MR Lumbar Spine Wo Contrast     Musculoskeletal and Integument   Skin tag   Recommend dermatology evaluation.  Referral placed today.      Relevant Orders   Ambulatory referral to Dermatology     Other   Chronic insomnia   Much improved with Ambien 10 mg at bedtime as needed.       Urinary incontinence   Most recent episodes 2 nights ago Recommend GYN evaluation, possibly urology evaluation.      Other Visit Diagnoses       Vitamin D deficiency       Relevant Medications   Vitamin D, Ergocalciferol, (DRISDOL) 1.25 MG (50000 UNIT) CAPS capsule  Patient Instructions  Sciatica  Sciatica is pain, weakness, tingling, or loss of feeling (numbness) along the sciatic nerve. The sciatic nerve starts in the lower back and goes down the back of each leg. Sciatica usually affects one side of the body. Sciatica usually goes  away on its own or with treatment. Sometimes, sciatica may come back. What are the causes? This condition happens when the sciatic nerve is pinched or has pressure put on it. This may be caused by: A disk in between the bones of the spine bulging out too far (herniated disk). Changes in the spinal disks due to aging. A condition that affects a muscle in the butt. Extra bone growth near the sciatic nerve. A break (fracture) of the area between your hip bones (pelvis). Pregnancy. Tumor. This is rare. What increases the risk? You are more likely to develop this condition if you: Play sports that put pressure or stress on the spine. Have poor strength and ease of movement (flexibility). Have had a back injury or back surgery. Sit for long periods of time. Do activities that involve bending or lifting over and over again. Are very overweight (obese). What are the signs or symptoms? Symptoms can vary from mild to very bad. They may include: Any of these problems in the lower back, leg, hip, or butt: Mild tingling, loss of feeling, or dull aches. A burning feeling. Sharp pains. Loss of feeling in the back of the calf or the sole of the foot. Leg weakness. Very bad back pain that makes it hard to move. These symptoms may get worse when you cough, sneeze, or laugh. They may also get worse when you sit or stand for long periods of time. How is this treated? This condition often gets better without any treatment. However, treatment may include: Changing or cutting back on physical activity when you have pain. Exercising, including strengthening and stretching. Putting ice or heat on the affected area. Shots of medicines to relieve pain and swelling or to relax your muscles. Surgery. Follow these instructions at home: Medicines Take over-the-counter and prescription medicines only as told by your doctor. Ask your doctor if you should avoid driving or using machines while you are taking your  medicine. Managing pain     If told, put ice on the affected area. To do this: Put ice in a plastic bag. Place a towel between your skin and the bag. Leave the ice on for 20 minutes, 2-3 times a day. If your skin turns bright red, take off the ice right away to prevent skin damage. The risk of skin damage is higher if you cannot feel pain, heat, or cold. If told, put heat on the affected area. Do this as often as told by your doctor. Use the heat source that your doctor tells you to use, such as a moist heat pack or a heating pad. Place a towel between your skin and the heat source. Leave the heat on for 20-30 minutes. If your skin turns bright red, take off the heat right away to prevent burns. The risk of burns is higher if you cannot feel pain, heat, or cold. Activity  Return to your normal activities when your doctor says that it is safe. Avoid activities that make your symptoms worse. Take short rests during the day. When you rest for a long time, do some physical activity or stretching between periods of rest. Avoid sitting for a long time without moving. Get up and move around at  least one time each hour. Do exercises and stretches as told by your doctor. Do not lift anything that is heavier than 10 lb (4.5 kg). Avoid lifting heavy things even when you do not have symptoms. Avoid lifting heavy things over and over. When you lift objects, always lift in a way that is safe for your body. To do this, you should: Bend your knees. Keep the object close to your body. Avoid twisting. General instructions Stay at a healthy weight. Wear comfortable shoes that support your feet. Avoid wearing high heels. Avoid sleeping on a mattress that is too soft or too hard. You might have less pain if you sleep on a mattress that is firm enough to support your back. Contact a doctor if: Your pain is not controlled by medicine. Your pain does not get better. Your pain gets worse. Your pain lasts  longer than 4 weeks. You lose weight without trying. Get help right away if: You cannot control when you pee (urinate) or poop (have a bowel movement). You have weakness in any of these areas and it gets worse: Lower back. The area between your hip bones. Butt. Legs. You have redness or swelling of your back. You have a burning feeling when you pee. Summary Sciatica is pain, weakness, tingling, or loss of feeling (numbness) along the sciatic nerve. This may include the lower back, legs, hips, and butt. This condition happens when the sciatic nerve is pinched or has pressure put on it. Treatment often includes rest, exercise, medicines, and putting ice or heat on the affected area. This information is not intended to replace advice given to you by your health care provider. Make sure you discuss any questions you have with your health care provider. Document Revised: 04/24/2021 Document Reviewed: 04/24/2021 Elsevier Patient Education  2024 Elsevier Inc.     Edwina Barth, MD [facility]

## 2023-03-12 NOTE — Assessment & Plan Note (Signed)
Recommend dermatology evaluation.  Referral placed today.

## 2023-03-12 NOTE — Assessment & Plan Note (Signed)
Most recent episodes 2 nights ago Recommend GYN evaluation, possibly urology evaluation.

## 2023-03-12 NOTE — Assessment & Plan Note (Signed)
Well-controlled hypertension Continue amlodipine 10 mg daily.  Has been taking it every other day.

## 2023-03-14 ENCOUNTER — Other Ambulatory Visit (HOSPITAL_COMMUNITY): Payer: Self-pay

## 2023-03-20 ENCOUNTER — Ambulatory Visit: Payer: 59 | Admitting: Obstetrics and Gynecology

## 2023-03-27 ENCOUNTER — Ambulatory Visit
Admission: RE | Admit: 2023-03-27 | Discharge: 2023-03-27 | Disposition: A | Discharge status: Home / Self Care | Payer: 59 | Source: Ambulatory Visit | Attending: Emergency Medicine | Admitting: Emergency Medicine

## 2023-03-27 ENCOUNTER — Ambulatory Visit: Payer: 59 | Admitting: Obstetrics and Gynecology

## 2023-03-27 ENCOUNTER — Other Ambulatory Visit: Payer: Self-pay | Admitting: Emergency Medicine

## 2023-03-27 ENCOUNTER — Other Ambulatory Visit (HOSPITAL_COMMUNITY): Payer: Self-pay

## 2023-03-27 DIAGNOSIS — M5432 Sciatica, left side: Secondary | ICD-10-CM

## 2023-03-27 DIAGNOSIS — M5416 Radiculopathy, lumbar region: Secondary | ICD-10-CM | POA: Diagnosis not present

## 2023-03-27 DIAGNOSIS — F5104 Psychophysiologic insomnia: Secondary | ICD-10-CM

## 2023-03-27 MED ORDER — ZOLPIDEM TARTRATE 10 MG PO TABS
10.0000 mg | ORAL_TABLET | Freq: Every evening | ORAL | 1 refills | Status: DC | PRN
  Filled 2023-03-27: qty 20, 20d supply, fill #0
  Filled 2023-04-20: qty 20, 20d supply, fill #1

## 2023-03-28 ENCOUNTER — Other Ambulatory Visit (HOSPITAL_COMMUNITY): Payer: Self-pay

## 2023-04-03 NOTE — Progress Notes (Signed)
 Patient attended screening event on 03/08/2023. At the screening event pt BP was 151/105, At the event the patient documented her PCP was Eilleen Kempf, [place], [place]. Pt documented [employer] Plan for insurance. Pt documented no tobacco use and pt did not live with someone who smokes. Pt did not note SDOH needs at event. At the event pt was advised to f/u with PCP at scheduled appt with Dr. Eilleen Kempf, [place] on 03/12/2023.    Per chart review confirms pt PCP is Eilleen Kempf, Alvy Bimler, MD for [facility] at [facility]. Pt insurance coverage is [employer]. Post event initial f/u note, Pt last office visit with PCP was 03/12/2023 at [facility]. Per chart review at 03/12/2023 appt visit pt indicated food insecurities. Per chart review pt was provided food insecurity resources at 03/08/2023 event. Hypertension is on pt problem list that's being addressed by PCP. Pt is currently taking Amlodipine to manage BP.   Chart review also indicates pt has a future appt with Dr. Eilleen Kempf, Alvy Bimler, MD tomorrow 04/04/2023 for a f/u.   CHW called pt multiple times to f/u on food insecurity need indicated at the screening event. Left a message for pt to call back CHW. Abnormal Letter sent with [employer] Card for food insecurity resource in case needed by pt. No additional Health equity team support indicated at this time.

## 2023-04-04 ENCOUNTER — Other Ambulatory Visit (INDEPENDENT_AMBULATORY_CARE_PROVIDER_SITE_OTHER): Payer: Self-pay

## 2023-04-04 ENCOUNTER — Ambulatory Visit: Payer: 59 | Admitting: Orthopedic Surgery

## 2023-04-04 ENCOUNTER — Telehealth: Admitting: Emergency Medicine

## 2023-04-04 VITALS — BP 159/96 | HR 79 | Ht 69.0 in | Wt 222.0 lb

## 2023-04-04 DIAGNOSIS — M545 Low back pain, unspecified: Secondary | ICD-10-CM

## 2023-04-04 DIAGNOSIS — M5416 Radiculopathy, lumbar region: Secondary | ICD-10-CM | POA: Diagnosis not present

## 2023-04-04 NOTE — Progress Notes (Signed)
 Orthopedic Spine Surgery Office Note  Assessment: Patient is a 53 y.o. female with left buttock and posterior thigh pain. Numbness/paresthesias over the lateral left thigh. Has a L5/S1 lumbar disc herniation that could be causing radiculopathy   Plan: -Explained that initially conservative treatment is tried as a significant number of patients may experience relief with these treatment modalities. Discussed that the conservative treatments include:  -activity modification  -physical therapy  -over the counter pain medications  -medrol dosepak  -lumbar steroid injections -Patient has tried tylenol, aleve  -Recommended diagnostic/therapeutic lumbar injection. Referral provided to her today -Patient should return to office in 6 weeks, x-rays at next visit: none   Patient expressed understanding of the plan and all questions were answered to the patient's satisfaction.   ___________________________________________________________________________   History:  Patient is a 53 y.o. female who presents today for lumbar spine.  Patient has had about 2 years of left buttock pain.  There is no trauma or injury that preceded the onset of the pain.  Pain is gotten progressively worse with time.  She said that now the pain radiates down the posterior aspect of her left thigh.  She has also developed numbness and paresthesias along the lateral aspect of the left thigh.  She does not have any pain or paresthesias past the knee on the left side.  No pain, paresthesias, or numbness going into the right lower extremity.  She feels that the pain at its worst is an 8 out of 10.  She notes that the pain gets worse with sitting and gets better with standing.  She also has the pain when laying on the left side at night.  Sometimes she feels a pulsating sensation in the same distribution as the pain.   Weakness: denies Symptoms of imbalance: denies Paresthesias and numbness: yes, has numbness and paresthesias  along the lateral aspect of the left thigh. No other numbness or paresthesias Bowel or bladder incontinence: yes, has had urinary incontinence for approximately 1 year. Is being evaluated by her OB/GYN Saddle anesthesia: denies  Treatments tried: tylenol, aleve   Review of systems: Denies fevers and chills, night sweats, unexplained weight loss, history of cancer, pain that wakes them at night  Past medical history: HTN Allergic rhinitis Sjgren  Allergies: NKDA  Past surgical history:  Hysterectomy Appendectomy Eye surgery  Social history: Denies use of nicotine product (smoking, vaping, patches, smokeless) Alcohol use: yes, approximately 1-2 drinks per week Denies recreational drug use   Physical Exam:  BMI of 32.8  General: no acute distress, appears stated age Neurologic: alert, answering questions appropriately, following commands Respiratory: unlabored breathing on room air, symmetric chest rise Psychiatric: appropriate affect, normal cadence to speech   MSK (spine):  -Strength exam      Left  Right EHL    5/5  5/5 TA    5/5  5/5 GSC    5/5  5/5 Knee extension  5/5  5/5 Hip flexion   5/5  5/5  -Sensory exam    Sensation intact to light touch in L3-S1 nerve distributions of bilateral lower extremities  -Achilles DTR: 2/4 on the left, 2/4 on the right -Patellar tendon DTR: 2/4 on the left, 2/4 on the right  -Straight leg raise: negative bilaterally -Femoral nerve stretch test: negative bilaterally -Clonus: no beats bilaterally  -Left hip exam: no pain through range of motion, negative stinchfield, negative FABER -Right hip exam: no pain through range of motion, negative stinchfield, negative FABER  Imaging: XRs of  the lumbar spine from 04/04/2023 was independently reviewed and interpreted, showing no significant degenerative changes. No evidence of instability on flexion/extension views. No fracture or dislocation seen. Lumbar coronal curvature seen  that measures 12 degrees with apex to the left.   MRI of the lumbar spine from 03/27/2023 was independently reviewed and interpreted, showing disc desiccation at L5/S1. Small disc herniation at L5/S1 that appears to abut the S1 nerve roots bilaterally. No significant foraminal or central stenosis seen.    Patient name: Deanna Cook Patient MRN: [id number] Date of visit: 04/04/23

## 2023-04-07 ENCOUNTER — Encounter: Payer: Self-pay | Admitting: Emergency Medicine

## 2023-04-10 ENCOUNTER — Ambulatory Visit: Payer: 59 | Admitting: Obstetrics and Gynecology

## 2023-04-10 ENCOUNTER — Encounter: Payer: Self-pay | Admitting: Obstetrics and Gynecology

## 2023-04-10 VITALS — BP 144/78 | HR 78 | Ht 70.25 in | Wt 217.0 lb

## 2023-04-10 DIAGNOSIS — N3945 Continuous leakage: Secondary | ICD-10-CM | POA: Diagnosis not present

## 2023-04-10 LAB — URINALYSIS, COMPLETE W/RFL CULTURE
Bacteria, UA: NONE SEEN /HPF
Bilirubin Urine: NEGATIVE
Glucose, UA: NEGATIVE
Hgb urine dipstick: NEGATIVE
Hyaline Cast: NONE SEEN /LPF
Ketones, ur: NEGATIVE
Leukocyte Esterase: NEGATIVE
Nitrites, Initial: NEGATIVE
Protein, ur: NEGATIVE
RBC / HPF: NONE SEEN /HPF (ref 0–2)
Specific Gravity, Urine: 1.02 (ref 1.001–1.035)
WBC, UA: NONE SEEN /HPF (ref 0–5)
pH: 7 (ref 5.0–8.0)

## 2023-04-10 LAB — NO CULTURE INDICATED

## 2023-04-10 NOTE — Progress Notes (Signed)
 53 y.o. G0P0000 female s/p TAH (2014)  with VMS (on estradiol patch), Sjogren's, HTN here for urinary concerns. Married.  No LMP recorded. Patient has had a hysterectomy.  Pt reports uncontrolled urinary incontinence, without urge sensation. She has had 2 episodes over the past year both without an urge sensation and with large volume void. Also reports an episode of uncontrolled nocturia x1 recently. Now having constant/continuous leakage. Now timing voids and thinking a lot about bathroom use. Affecting QOL.  Frequency: using bathroom 4x/day, no nocturia (accept incontinence episode) No SUI Fluid intake: 32 oz coffee, 40oz water, wine with dinner ocassionally No stool incontinence.  Last mammogram: 07/19/2022 BI-RADS 2, density 2  GYN HISTORY: No significant history  OB History  Gravida Para Term Preterm AB Living  0 0 0 0 0 0  SAB IAB Ectopic Multiple Live Births  0 0 0 0 0    Past Medical History:  Diagnosis Date   Allergic rhinitis    Allergy    Fibroid    Hypertension     Past Surgical History:  Procedure Laterality Date   ABDOMINAL HYSTERECTOMY     LAVH-2014   APPENDECTOMY  1989   COLONOSCOPY  2005   about 20 years ago in [place]-normal exam   EYE SURGERY     Hole in retina    Current Outpatient Medications on File Prior to Visit  Medication Sig Dispense Refill   amLODipine (NORVASC) 10 MG tablet Take 1 tablet (10 mg total) by mouth daily. 90 tablet 3   ascorbic acid (VITAMIN C) 500 MG tablet Take 1 tablet (500 mg total) by mouth daily. 90 tablet 1   Cyanocobalamin (B-12) 1000 MCG TABS Take 1 tablet by mouth as needed.     estradiol (VIVELLE-DOT) 0.05 MG/24HR patch Place 1 patch (0.05 mg total) onto the skin 2 (two) times a week. 24 patch 3   fexofenadine (ALLEGRA) 180 MG tablet Take 180 mg by mouth as needed.     fluticasone (FLONASE) 50 MCG/ACT nasal spray Place into both nostrils as needed.     HYDROcodone-acetaminophen (NORCO/VICODIN) 5-325 MG tablet  Take 1 tablet by mouth every 4 (four) to 6 (six) hours as needed for pain. 12 tablet 0   Multiple Vitamin (MULTIVITAMIN) capsule Take 1 capsule by mouth as needed.     Sodium Fluoride 1.1 % PSTE Brush with paste twice daily; morning and at night 100 mL 12   Vitamin D, Ergocalciferol, (DRISDOL) 1.25 MG (50000 UNIT) CAPS capsule Take 1 capsule (50,000 Units total) by mouth every 7 (seven) days. 7 capsule 1   zolpidem (AMBIEN) 10 MG tablet Take 1 tablet (10 mg total) by mouth at bedtime as needed for sleep. 20 tablet 1   No current facility-administered medications on file prior to visit.    No Known Allergies    PE Today's Vitals   04/10/23 1041  BP: (!) 144/78  Pulse: 78  SpO2: 98%  Weight: 217 lb (98.4 kg)  Height: 5' 10.25" (1.784 m)   Body mass index is 30.92 kg/m.  Physical Exam Vitals reviewed. Exam conducted with a chaperone present.  Constitutional:      General: She is not in acute distress.    Appearance: Normal appearance.  HENT:     Head: Normocephalic and atraumatic.     Nose: Nose normal.  Eyes:     Extraocular Movements: Extraocular movements intact.     Conjunctiva/sclera: Conjunctivae normal.  Pulmonary:     Effort: Pulmonary effort is  normal.  Genitourinary:    General: Normal vulva.     Exam position: Lithotomy position.     Vagina: Normal. No vaginal discharge.     Cervix: Normal. No cervical motion tenderness, discharge or lesion.     Uterus: Normal. Not enlarged and not tender.      Adnexa: Right adnexa normal and left adnexa normal.     Comments: Kegel 3/5 Musculoskeletal:        General: Normal range of motion.     Cervical back: Normal range of motion.  Neurological:     General: No focal deficit present.     Mental Status: She is alert.  Psychiatric:        Mood and Affect: Mood normal.        Behavior: Behavior normal.     Bladder catheterization: Bladder catheterization was performed after prepping the urethral opening with betadine  with a flexible 18fr catheter. 10cc was emptied from the bladder.   Assessment and Plan:        Continuous leakage of urine -     Urinalysis,Complete w/RFL Culture -     Ambulatory referral to Urogynecology  Normal PVR and UA.  Weak kegel on exam. Known lumbar disc protusion with left sided sciatica, planning for steroid injection with ortho Denies urge incontience and SUI Recommend referral to UROGYN for urodynamics and further evaluation. Continue pelvic floor exercises in the interim  Rosalyn Gess, MD

## 2023-04-16 ENCOUNTER — Telehealth (INDEPENDENT_AMBULATORY_CARE_PROVIDER_SITE_OTHER): Admitting: Emergency Medicine

## 2023-04-16 ENCOUNTER — Other Ambulatory Visit (HOSPITAL_COMMUNITY): Payer: Self-pay

## 2023-04-16 ENCOUNTER — Encounter: Payer: Self-pay | Admitting: Emergency Medicine

## 2023-04-16 DIAGNOSIS — J0100 Acute maxillary sinusitis, unspecified: Secondary | ICD-10-CM | POA: Insufficient documentation

## 2023-04-16 DIAGNOSIS — R053 Chronic cough: Secondary | ICD-10-CM | POA: Diagnosis not present

## 2023-04-16 DIAGNOSIS — R6889 Other general symptoms and signs: Secondary | ICD-10-CM | POA: Insufficient documentation

## 2023-04-16 MED ORDER — AZITHROMYCIN 250 MG PO TABS
ORAL_TABLET | ORAL | 0 refills | Status: AC
  Filled 2023-04-16: qty 6, 5d supply, fill #0

## 2023-04-16 NOTE — Assessment & Plan Note (Signed)
 Symptom management discussed Advised to take over-the-counter Mucinex DM and cough drops

## 2023-04-16 NOTE — Assessment & Plan Note (Signed)
 Viral upper respiratory infection now with secondary bacterial sinus infection Recommend azithromycin daily for 5 days. Symptom management discussed. Advised to rest and stay well-hydrated Advised to use saline nasal sprays several times during the day

## 2023-04-16 NOTE — Assessment & Plan Note (Signed)
 Cough management discussed Advised to rest and stay well-hydrated Recommend to take Mucinex DM and cough drops during the day NyQuil at bedtime

## 2023-04-16 NOTE — Progress Notes (Signed)
 Telemedicine Encounter- SOAP NOTE Established Patient MyChart video encounter Patient: Home  Provider: Office   Patient present only  This video encounter was conducted with the patient's (or proxy's) verbal consent via video telecommunications: yes/no: Yes Patient was instructed to have this encounter in a suitably private space; and to only have persons present to whom they give permission to participate. In addition, patient identity was confirmed by use of name plus two identifiers (DOB and address).  Chief complaint: Flulike symptoms and persistent cough Subjective  Deanna Cook is a 53 y.o. female established patient.  Visit today complaining of persistent cough and flulike symptoms along with sinus congestion and postnasal discharge for the past 2 to 3 weeks.  Slowly getting better.  No other associated symptoms.  Had fever in the beginning but not anymore. Able to eat and drink.  Denies nausea or vomiting. No other complaints or medical concerns today. HPI ? Patient Active Problem List   Diagnosis Date Noted   Left sided sciatica 03/12/2023   Skin tag 03/12/2023   Urinary incontinence 03/12/2023   Generalized anxiety disorder 07/10/2022   Chronic insomnia 01/08/2022   Sjogren's syndrome (HCC) 01/08/2022   Chronic foot pain, left 01/08/2022   Suspected sleep apnea 01/08/2022   Protrusion of lumbar intervertebral disc 09/19/2021   Positive ANA (antinuclear antibody) 10/21/2020   Chronic pain of right knee 10/21/2020   Essential hypertension 02/11/2019   Refusal of blood transfusions as patient is Jehovah's Witness 08/07/2018   Seasonal allergies 08/07/2018   Past Medical History:  Diagnosis Date   Allergic rhinitis    Allergy    Fibroid    Hypertension    Current Outpatient Medications  Medication Sig Dispense Refill   azithromycin (ZITHROMAX) 250 MG tablet Sig as indicated 6 tablet 0   amLODipine (NORVASC) 10 MG tablet Take 1 tablet (10 mg total) by mouth daily. 90  tablet 3   ascorbic acid (VITAMIN C) 500 MG tablet Take 1 tablet (500 mg total) by mouth daily. 90 tablet 1   Cyanocobalamin (B-12) 1000 MCG TABS Take 1 tablet by mouth as needed.     estradiol (VIVELLE-DOT) 0.05 MG/24HR patch Place 1 patch (0.05 mg total) onto the skin 2 (two) times a week. 24 patch 3   fexofenadine (ALLEGRA) 180 MG tablet Take 180 mg by mouth as needed.     fluticasone (FLONASE) 50 MCG/ACT nasal spray Place into both nostrils as needed.     HYDROcodone-acetaminophen (NORCO/VICODIN) 5-325 MG tablet Take 1 tablet by mouth every 4 (four) to 6 (six) hours as needed for pain. 12 tablet 0   Multiple Vitamin (MULTIVITAMIN) capsule Take 1 capsule by mouth as needed.     Sodium Fluoride 1.1 % PSTE Brush with paste twice daily; morning and at night 100 mL 12   Vitamin D, Ergocalciferol, (DRISDOL) 1.25 MG (50000 UNIT) CAPS capsule Take 1 capsule (50,000 Units total) by mouth every 7 (seven) days. 7 capsule 1   zolpidem (AMBIEN) 10 MG tablet Take 1 tablet (10 mg total) by mouth at bedtime as needed for sleep. 20 tablet 1   No current facility-administered medications for this visit.   No Known Allergies Social History   Socioeconomic History   Marital status: Married    Spouse name: Not on file   Number of children: Not on file   Years of education: Not on file   Highest education level: Bachelor's degree (e.g., BA, AB, BS)  Occupational History   Not on file  Tobacco  Use   Smoking status: Never    Passive exposure: Never   Smokeless tobacco: Never  Vaping Use   Vaping status: Never Used  Substance and Sexual Activity   Alcohol use: Yes    Alcohol/week: 3.0 standard drinks of alcohol    Types: 3 Standard drinks or equivalent per week    Comment: social   Drug use: Never   Sexual activity: Yes    Birth control/protection: Surgical    Comment: 1st intercourse 53 yo-More than 5 partners  Other Topics Concern   Not on file  Social History Narrative   Not on file    Social Drivers of Health   Financial Resource Strain: Medium Risk (03/12/2023)   Overall Financial Resource Strain (CARDIA)    Difficulty of Paying Living Expenses: Somewhat hard  Food Insecurity: Food Insecurity Present (03/12/2023)   Hunger Vital Sign    Worried About Running Out of Food in the Last Year: Sometimes true    Ran Out of Food in the Last Year: Never true  Transportation Needs: No Transportation Needs (03/12/2023)   PRAPARE - Administrator, Civil Service (Medical): No    Lack of Transportation (Non-Medical): No  Physical Activity: Insufficiently Active (03/12/2023)   Exercise Vital Sign    Days of Exercise per Week: 4 days    Minutes of Exercise per Session: 20 min  Stress: Stress Concern Present (03/12/2023)   [employer] of Occupational Health - Occupational Stress Questionnaire    Feeling of Stress : Rather much  Social Connections: Moderately Integrated (03/12/2023)   Social Connection and Isolation Panel [NHANES]    Frequency of Communication with Friends and Family: Once a week    Frequency of Social Gatherings with Friends and Family: Once a week    Attends Religious Services: 1 to 4 times per year    Active Member of [employer] or Organizations: Yes    Attends Banker Meetings: 1 to 4 times per year    Marital Status: Married  Catering manager Violence: Not At Risk (03/08/2023)   Humiliation, Afraid, Rape, and Kick questionnaire    Fear of Current or Ex-Partner: No    Emotionally Abused: No    Physically Abused: No    Sexually Abused: No   Review of Systems  Constitutional: Negative.  Negative for chills and fever.  HENT:  Positive for congestion. Negative for ear pain.   Respiratory:  Positive for cough.   Cardiovascular: Negative.  Negative for chest pain and palpitations.  Gastrointestinal:  Negative for abdominal pain, nausea and vomiting.  Skin:  Negative for rash.  Neurological:  Negative for dizziness and headaches.  All  other systems reviewed and are negative.  Objective  Alert and oriented x 3 in no apparent respiratory distress There were no vitals filed for this visit. Problem List Items Addressed This Visit       Respiratory   Acute non-recurrent maxillary sinusitis - Primary   Viral upper respiratory infection now with secondary bacterial sinus infection Recommend azithromycin daily for 5 days. Symptom management discussed. Advised to rest and stay well-hydrated Advised to use saline nasal sprays several times during the day      Relevant Medications   azithromycin (ZITHROMAX) 250 MG tablet     Other   Flu-like symptoms   Symptom management discussed Advised to take over-the-counter Mucinex DM and cough drops       Persistent cough   Cough management discussed Advised to rest and stay well-hydrated  Recommend to take Mucinex DM and cough drops during the day NyQuil at bedtime        I discussed the assessment and treatment plan with the patient. The patient was provided an opportunity to ask questions and all were answered. The patient agreed with the plan and demonstrated an understanding of the instructions.   The patient was advised to call back or seek an in-person evaluation if the symptoms worsen or if the condition fails to improve as anticipated.  I provided 25 minutes of non-face-to-face time during this encounter including time preparing for this visit, review of most recent office visit notes, review of chronic medical conditions under management, review of all medications, diagnosis of possible bacterial sinus infection and need for antibiotics, symptom management, prognosis, documentation and need for follow-up if no better or worse during the next several days  Dr. Edwina Barth, MD [facility] at [facility]

## 2023-04-22 ENCOUNTER — Other Ambulatory Visit: Payer: Self-pay

## 2023-04-22 ENCOUNTER — Other Ambulatory Visit (HOSPITAL_COMMUNITY): Payer: Self-pay

## 2023-05-16 ENCOUNTER — Other Ambulatory Visit: Payer: Self-pay | Admitting: Emergency Medicine

## 2023-05-16 DIAGNOSIS — F5104 Psychophysiologic insomnia: Secondary | ICD-10-CM

## 2023-05-17 ENCOUNTER — Other Ambulatory Visit (HOSPITAL_COMMUNITY): Payer: Self-pay

## 2023-05-17 ENCOUNTER — Other Ambulatory Visit: Payer: Self-pay | Admitting: Emergency Medicine

## 2023-05-17 NOTE — Telephone Encounter (Signed)
 Copied from CRM [id number]. Topic: Clinical - Medication Refill >> May 17, 2023 11:57 AM [place] J wrote: Most Recent Primary Care Visit:  Provider: Elvira Hammersmith  Department: [facility] [place]  Visit Type: MYCHART VIDEO VISIT  Date: 04/16/2023  Medication: zolpidem  (AMBIEN ) 10 MG tablet  Has the patient contacted their pharmacy? Yes (Agent: If no, request that the patient contact the pharmacy for the refill. If patient does not wish to contact the pharmacy document the reason why and proceed with request.) (Agent: If yes, when and what did the pharmacy advise?) Pharmacy advised patient to contact primary for refill. Is this the correct pharmacy for this prescription? Yes If no, delete pharmacy and type the correct one.  This is the patient's preferred pharmacy:  [facility] [id number] [place] [address] Phone: [phone] Fax: [phone]   Has the prescription been filled recently? No  Is the patient out of the medication? Yes  Has the patient been seen for an appointment in the last year OR does the patient have an upcoming appointment? Yes  Can we respond through MyChart? Yes  Agent: Please be advised that Rx refills may take up to 3 business days. We ask that you follow-up with your pharmacy.

## 2023-05-20 ENCOUNTER — Other Ambulatory Visit (HOSPITAL_COMMUNITY): Payer: Self-pay

## 2023-05-20 MED ORDER — ZOLPIDEM TARTRATE 10 MG PO TABS
10.0000 mg | ORAL_TABLET | Freq: Every evening | ORAL | 1 refills | Status: DC | PRN
  Filled 2023-05-20: qty 20, 20d supply, fill #0
  Filled 2023-06-20: qty 20, 20d supply, fill #1

## 2023-05-23 ENCOUNTER — Encounter: Payer: Self-pay | Admitting: Obstetrics and Gynecology

## 2023-05-23 ENCOUNTER — Other Ambulatory Visit (HOSPITAL_COMMUNITY): Payer: Self-pay

## 2023-05-23 ENCOUNTER — Ambulatory Visit (INDEPENDENT_AMBULATORY_CARE_PROVIDER_SITE_OTHER): Admitting: Obstetrics and Gynecology

## 2023-05-23 VITALS — BP 120/72 | HR 87 | Temp 98.2°F | Ht 70.0 in | Wt 217.0 lb

## 2023-05-23 DIAGNOSIS — Z79890 Hormone replacement therapy (postmenopausal): Secondary | ICD-10-CM | POA: Diagnosis not present

## 2023-05-23 DIAGNOSIS — Z1331 Encounter for screening for depression: Secondary | ICD-10-CM

## 2023-05-23 DIAGNOSIS — N951 Menopausal and female climacteric states: Secondary | ICD-10-CM

## 2023-05-23 DIAGNOSIS — M5432 Sciatica, left side: Secondary | ICD-10-CM | POA: Diagnosis not present

## 2023-05-23 DIAGNOSIS — N958 Other specified menopausal and perimenopausal disorders: Secondary | ICD-10-CM | POA: Insufficient documentation

## 2023-05-23 DIAGNOSIS — Z01419 Encounter for gynecological examination (general) (routine) without abnormal findings: Secondary | ICD-10-CM | POA: Insufficient documentation

## 2023-05-23 MED ORDER — GABAPENTIN 100 MG PO CAPS
ORAL_CAPSULE | ORAL | 3 refills | Status: AC
  Filled 2023-05-23 (×2): qty 261, 90d supply, fill #0
  Filled 2023-09-11 (×2): qty 261, 87d supply, fill #1
  Filled 2023-12-11: qty 261, 9d supply, fill #2
  Filled 2023-12-11: qty 270, 90d supply, fill #2
  Filled 2024-01-19: qty 270, 93d supply, fill #3

## 2023-05-23 MED ORDER — ESTRADIOL 0.075 MG/24HR TD PTTW
1.0000 | MEDICATED_PATCH | TRANSDERMAL | 12 refills | Status: AC
  Filled 2023-05-23: qty 8, 28d supply, fill #0
  Filled 2023-07-07: qty 8, 28d supply, fill #1
  Filled 2023-08-15: qty 8, 28d supply, fill #2
  Filled 2023-10-16: qty 8, 28d supply, fill #3
  Filled 2023-11-12: qty 8, 28d supply, fill #4
  Filled 2023-12-11: qty 8, 28d supply, fill #5
  Filled 2024-01-19: qty 8, 28d supply, fill #6
  Filled 2024-02-16: qty 8, 28d supply, fill #0
  Filled 2024-02-17: qty 24, 84d supply, fill #0
  Filled 2024-02-18: qty 8, 28d supply, fill #0

## 2023-05-23 MED ORDER — ESTRADIOL 0.1 MG/GM VA CREA
TOPICAL_CREAM | VAGINAL | 1 refills | Status: AC
  Filled 2023-05-23: qty 42.5, 90d supply, fill #0

## 2023-05-23 NOTE — Assessment & Plan Note (Addendum)
 Increase estradiol  patch to 75 mcg We will start gabapentin  at nighttime to help with insomnia, hot flashes, left-sided sciatica pain Discussed side effects including drowsiness, dizziness, mood changes, irritability, insomnia

## 2023-05-23 NOTE — Patient Instructions (Signed)

## 2023-05-23 NOTE — Progress Notes (Signed)
 53 y.o. G0P0000 female s/p TAH (2014) with VMS (on estradiol  patch), Sjogren's, HTN, urinary incontience here for annual exam. Married. Works in  Production manager for [employer].  No LMP recorded. Patient has had a hysterectomy. Patient seen 04/10/2023 reporting isolated episodes of unprompted large-volume urinary incontinence. referred to UROGYN,  appt scheduled Decided against steroid injection in left back, has history of lumbar disc protrusion and left-sided sciatica, working on nerve flossing at home Hot flashes have restarted Noting vaginal dryness along right vaginal lip  Abnormal bleeding: none Pelvic discharge or pain: None Breast mass, nipple discharge or skin changes : None Last PAP: No results found for: "DIAGPAP", "HPVHIGH", "ADEQPAP" Last mammogram: 07/19/22>07/26/22 BI-RADS 2, density C Last colonoscopy: 10/31/20, q54yr Sexually active: yes  Exercising: yes, walking Smoker: no  Flowsheet Row Office Visit from 05/23/2023 in [facility]  PHQ-2 Total Score 0       Flowsheet Row Office Visit from 01/08/2022 in [facility]  PHQ-9 Total Score 0        GYN HISTORY: No significant history  OB History  Gravida Para Term Preterm AB Living  0 0 0 0 0 0  SAB IAB Ectopic Multiple Live Births  0 0 0 0 0    Past Medical History:  Diagnosis Date   Allergic rhinitis    Allergy     Fibroid    Hypertension     Past Surgical History:  Procedure Laterality Date   ABDOMINAL HYSTERECTOMY     LAVH-2014   APPENDECTOMY  1989   COLONOSCOPY  2005   about 20 years ago in [place] -normal exam   EYE SURGERY     Hole in retina    Current Outpatient Medications on File Prior to Visit  Medication Sig Dispense Refill   amLODipine  (NORVASC ) 10 MG tablet Take 1 tablet (10 mg total) by mouth daily. 90 tablet 3   ascorbic acid  (VITAMIN C) 500 MG tablet Take 1 tablet (500 mg total) by mouth daily. 90 tablet 1    Cyanocobalamin (B-12) 1000 MCG TABS Take 1 tablet by mouth as needed.     fexofenadine (ALLEGRA) 180 MG tablet Take 180 mg by mouth as needed.     fluticasone  (FLONASE) 50 MCG/ACT nasal spray Place into both nostrils as needed.     Multiple Vitamin (MULTIVITAMIN) capsule Take 1 capsule by mouth as needed.     Sodium Fluoride  1.1 % PSTE Brush with paste twice daily; morning and at night 100 mL 12   Vitamin D , Ergocalciferol , (DRISDOL ) 1.25 MG (50000 UNIT) CAPS capsule Take 1 capsule (50,000 Units total) by mouth every 7 (seven) days. 7 capsule 1   zolpidem  (AMBIEN ) 10 MG tablet Take 1 tablet (10 mg total) by mouth at bedtime as needed for sleep. 20 tablet 1   No current facility-administered medications on file prior to visit.    Social History   Socioeconomic History   Marital status: Married    Spouse name: Not on file   Number of children: Not on file   Years of education: Not on file   Highest education level: Bachelor's degree (e.g., BA, AB, BS)  Occupational History   Not on file  Tobacco Use   Smoking status: Never    Passive exposure: Never   Smokeless tobacco: Never  Vaping Use   Vaping status: Never Used  Substance and Sexual Activity   Alcohol use: Yes    Alcohol/week: 3.0 standard drinks of  alcohol    Types: 3 Standard drinks or equivalent per week    Comment: social   Drug use: Never   Sexual activity: Yes    Birth control/protection: Surgical    Comment: 1st intercourse 53 yo-More than 5 partners  Other Topics Concern   Not on file  Social History Narrative   Not on file   Social Drivers of Health   Financial Resource Strain: Medium Risk (03/12/2023)   Overall Financial Resource Strain (CARDIA)    Difficulty of Paying Living Expenses: Somewhat hard  Food Insecurity: Food Insecurity Present (03/12/2023)   Hunger Vital Sign    Worried About Running Out of Food in the Last Year: Sometimes true    Ran Out of Food in the Last Year: Never true  Transportation  Needs: No Transportation Needs (03/12/2023)   PRAPARE - Administrator, Civil Service (Medical): No    Lack of Transportation (Non-Medical): No  Physical Activity: Insufficiently Active (03/12/2023)   Exercise Vital Sign    Days of Exercise per Week: 4 days    Minutes of Exercise per Session: 20 min  Stress: Stress Concern Present (03/12/2023)   [employer] of Occupational Health - Occupational Stress Questionnaire    Feeling of Stress : Rather much  Social Connections: Moderately Integrated (03/12/2023)   Social Connection and Isolation Panel [NHANES]    Frequency of Communication with Friends and Family: Once a week    Frequency of Social Gatherings with Friends and Family: Once a week    Attends Religious Services: 1 to 4 times per year    Active Member of [employer] or Organizations: Yes    Attends Banker Meetings: 1 to 4 times per year    Marital Status: Married  Catering manager Violence: Not At Risk (03/08/2023)   Humiliation, Afraid, Rape, and Kick questionnaire    Fear of Current or Ex-Partner: No    Emotionally Abused: No    Physically Abused: No    Sexually Abused: No    Family History  Problem Relation Age of Onset   Colon polyps Mother    Hypertension Mother    Colon cancer Mother 27   Colon polyps Father    Heart attack Father    Diabetes Mellitus I Father    Kidney failure Father    Thyroid  cancer Sister    Fibromyalgia Sister    Breast cancer Maternal Aunt    Breast cancer Cousin    Breast cancer Cousin    Kidney disease Brother    Esophageal cancer Neg Hx    Stomach cancer Neg Hx    Rectal cancer Neg Hx    Sleep apnea Neg Hx     No Known Allergies    PE Today's Vitals   05/23/23 0816  BP: 120/72  Pulse: 87  Temp: 98.2 F (36.8 C)  TempSrc: Oral  SpO2: 97%  Weight: 217 lb (98.4 kg)  Height: 5\' 10"  (1.778 m)   Body mass index is 31.14 kg/m.  Physical Exam Vitals reviewed. Exam conducted with a chaperone present.   Constitutional:      General: She is not in acute distress.    Appearance: Normal appearance.  HENT:     Head: Normocephalic and atraumatic.     Nose: Nose normal.  Eyes:     Extraocular Movements: Extraocular movements intact.     Conjunctiva/sclera: Conjunctivae normal.  Neck:     Thyroid : No thyroid  mass, thyromegaly or thyroid  tenderness.  Pulmonary:  Effort: Pulmonary effort is normal.  Chest:     Chest wall: No mass or tenderness.  Breasts:    Right: Normal. No swelling, mass, nipple discharge or tenderness.     Left: Normal. No swelling, mass, nipple discharge or tenderness.  Abdominal:     General: There is no distension.     Palpations: Abdomen is soft.     Tenderness: There is no abdominal tenderness.  Genitourinary:    General: Normal vulva.     Exam position: Lithotomy position.     Urethra: No prolapse.     Vagina: Normal. No vaginal discharge or bleeding.     Cervix: No lesion.     Adnexa: Right adnexa normal and left adnexa normal.     Comments: Cervix and uterus absent Musculoskeletal:        General: Normal range of motion.     Cervical back: Normal range of motion.  Lymphadenopathy:     Upper Body:     Right upper body: No axillary adenopathy.     Left upper body: No axillary adenopathy.     Lower Body: No right inguinal adenopathy. No left inguinal adenopathy.  Skin:    General: Skin is warm and dry.  Neurological:     General: No focal deficit present.     Mental Status: She is alert.  Psychiatric:        Mood and Affect: Mood normal.        Behavior: Behavior normal.       Assessment and Plan:        Well woman exam with routine gynecological exam Assessment & Plan: Cervical cancer screening: N/A Encouraged annual mammogram screening Colonoscopy UTD DXA N/A Labs and immunizations with her primary Encouraged safe sexual practices as indicated Encouraged healthy lifestyle practices with diet and exercise For patients under 50-70yo, I  recommend 1200mg  calcium daily and 600IU of vitamin D  daily.    Hormone replacement therapy (HRT) -     Estradiol ; Place 1 patch onto the skin 2 (two) times a week.  Dispense: 8 patch; Refill: 12  Negative depression screening  Genitourinary syndrome of menopause Assessment & Plan: Reviewed safety profile of low dose vaginal estrogen, however reviewed that higher doses have been associated with DVT, breast and uterine cancer.    Orders: -     Estradiol ; Apply 1/2 gram to vulva nightly for 2 weeks then decrease to 1/2 gram to vulva two nights a week. Do not use applicator.  Dispense: 42.5 g; Refill: 1  Left sided sciatica -     Gabapentin ; Take 1 capsule (100 mg total) by mouth at bedtime for 3 days, THEN 2 capsules (200 mg total) at bedtime for 3 days, THEN 3 capsules (300 mg total) at bedtime.  Dispense: 270 capsule; Refill: 3  Vasomotor symptoms due to menopause Assessment & Plan: Increase estradiol  patch to 75 mcg We will start gabapentin  at nighttime to help with insomnia, hot flashes, left-sided sciatica pain Discussed side effects including drowsiness, dizziness, mood changes, irritability, insomnia  Orders: -     Gabapentin ; Take 1 capsule (100 mg total) by mouth at bedtime for 3 days, THEN 2 capsules (200 mg total) at bedtime for 3 days, THEN 3 capsules (300 mg total) at bedtime.  Dispense: 270 capsule; Refill: 3    Romaine Closs, MD

## 2023-05-23 NOTE — Assessment & Plan Note (Signed)
Reviewed safety profile of low dose vaginal estrogen, however reviewed that higher doses have been associated with DVT, breast and uterine cancer.

## 2023-05-23 NOTE — Assessment & Plan Note (Signed)
 Cervical cancer screening: N/A Encouraged annual mammogram screening Colonoscopy UTD DXA N/A Labs and immunizations with her primary Encouraged safe sexual practices as indicated Encouraged healthy lifestyle practices with diet and exercise For patients under 50-52yo, I recommend 1200mg  calcium daily and 600IU of vitamin D  daily.

## 2023-05-29 ENCOUNTER — Ambulatory Visit
Admission: EM | Admit: 2023-05-29 | Discharge: 2023-05-29 | Disposition: A | Discharge status: Home / Self Care | Attending: Family Medicine | Admitting: Family Medicine

## 2023-05-29 ENCOUNTER — Ambulatory Visit (INDEPENDENT_AMBULATORY_CARE_PROVIDER_SITE_OTHER)

## 2023-05-29 ENCOUNTER — Other Ambulatory Visit (HOSPITAL_COMMUNITY): Payer: Self-pay

## 2023-05-29 DIAGNOSIS — J329 Chronic sinusitis, unspecified: Secondary | ICD-10-CM

## 2023-05-29 DIAGNOSIS — I10 Essential (primary) hypertension: Secondary | ICD-10-CM

## 2023-05-29 DIAGNOSIS — J209 Acute bronchitis, unspecified: Secondary | ICD-10-CM | POA: Diagnosis not present

## 2023-05-29 DIAGNOSIS — J309 Allergic rhinitis, unspecified: Secondary | ICD-10-CM

## 2023-05-29 DIAGNOSIS — J40 Bronchitis, not specified as acute or chronic: Secondary | ICD-10-CM

## 2023-05-29 MED ORDER — PROMETHAZINE-DM 6.25-15 MG/5ML PO SYRP
5.0000 mL | ORAL_SOLUTION | Freq: Every evening | ORAL | 0 refills | Status: DC | PRN
  Filled 2023-05-29: qty 100, 20d supply, fill #0

## 2023-05-29 MED ORDER — HYDROCHLOROTHIAZIDE 12.5 MG PO TABS
12.5000 mg | ORAL_TABLET | Freq: Every day | ORAL | 0 refills | Status: DC
  Filled 2023-05-29: qty 5, 5d supply, fill #0

## 2023-05-29 MED ORDER — AMOXICILLIN-POT CLAVULANATE 875-125 MG PO TABS: 1.0000 | ORAL_TABLET | Freq: Two times a day (BID) | ORAL | 0 refills | Status: DC

## 2023-05-29 MED ORDER — PREDNISONE 20 MG PO TABS
40.0000 mg | ORAL_TABLET | Freq: Every day | ORAL | 0 refills | Status: DC
Start: 2023-05-29 — End: 2023-07-21
  Filled 2023-05-29: qty 10, 5d supply, fill #0

## 2023-05-29 NOTE — Discharge Instructions (Addendum)
 Hold Mucinex D and we will use prednisone  instead as a great respiratory boost for your airways. Keep taking Allegra. Use cough syrup at bedtime. I will call later with the x-ray results and update you then.   If you continue to have sinus symptoms, respiratory symptoms then fill the script for Augmentin on Sunday and Monday as an antibiotic for a recurring sinus infection.

## 2023-05-29 NOTE — ED Provider Notes (Signed)
 [facility]  Note:  This document was prepared using Conservation officer, historic buildings and may include unintentional dictation errors.  MRN: [id number] DOB: 25-Nov-1970  Subjective:   Deanna Cook is a 53 y.o. female presenting for 5-day history of recurrent sinus congestion, sinus drainage, now feeling rattling in her chest and having hoarseness.  Reports that since January she has had a monthly sinus infection and has gotten antibiotics each time.  The last time this happened was March.  She has been taking Allegra and started taking Mucinex pseudoephedrine this morning.  She does have high blood pressure, takes amlodipine  for this.  No smoking.  No asthma.  She has had infrequent bronchitis over the years.  Has not needed to use an inhaler.  No current facility-administered medications for this encounter.  Current Outpatient Medications:    amLODipine  (NORVASC ) 10 MG tablet, Take 1 tablet (10 mg total) by mouth daily., Disp: 90 tablet, Rfl: 3   ascorbic acid  (VITAMIN C) 500 MG tablet, Take 1 tablet (500 mg total) by mouth daily., Disp: 90 tablet, Rfl: 1   Cyanocobalamin (B-12) 1000 MCG TABS, Take 1 tablet by mouth as needed., Disp: , Rfl:    estradiol  (ESTRACE  VAGINAL) 0.1 MG/GM vaginal cream, Apply 1/2 gram to vulva nightly for 2 weeks then decrease to 1/2 gram to vulva two nights a week. Do not use applicator., Disp: 42.5 g, Rfl: 1   estradiol  (VIVELLE -DOT) 0.075 MG/24HR, Place 1 patch onto the skin 2 (two) times a week., Disp: 8 patch, Rfl: 12   fexofenadine (ALLEGRA) 180 MG tablet, Take 180 mg by mouth as needed., Disp: , Rfl:    fluticasone  (FLONASE) 50 MCG/ACT nasal spray, Place into both nostrils as needed., Disp: , Rfl:    gabapentin  (NEURONTIN ) 100 MG capsule, Take 1 capsule (100 mg total) by mouth at bedtime for 3 days, THEN 2 capsules (200 mg total) at bedtime for 3 days, THEN 3 capsules (300 mg total) at bedtime., Disp: 270 capsule, Rfl: 3   Multiple  Vitamin (MULTIVITAMIN) capsule, Take 1 capsule by mouth as needed., Disp: , Rfl:    Sodium Fluoride  1.1 % PSTE, Brush with paste twice daily; morning and at night, Disp: 100 mL, Rfl: 12   Vitamin D , Ergocalciferol , (DRISDOL ) 1.25 MG (50000 UNIT) CAPS capsule, Take 1 capsule (50,000 Units total) by mouth every 7 (seven) days., Disp: 7 capsule, Rfl: 1   zolpidem  (AMBIEN ) 10 MG tablet, Take 1 tablet (10 mg total) by mouth at bedtime as needed for sleep., Disp: 20 tablet, Rfl: 1   No Known Allergies  Past Medical History:  Diagnosis Date   Allergic rhinitis    Allergy     Fibroid    Hypertension      Past Surgical History:  Procedure Laterality Date   ABDOMINAL HYSTERECTOMY     LAVH-2014   APPENDECTOMY  1989   COLONOSCOPY  2005   about 20 years ago in [place] -normal exam   EYE SURGERY     Hole in retina    Family History  Problem Relation Age of Onset   Colon polyps Mother    Hypertension Mother    Colon cancer Mother 79   Colon polyps Father    Heart attack Father    Diabetes Mellitus I Father    Kidney failure Father    Thyroid  cancer Sister    Fibromyalgia Sister    Breast cancer Maternal Aunt    Breast cancer Cousin    Breast  cancer Cousin    Kidney disease Brother    Esophageal cancer Neg Hx    Stomach cancer Neg Hx    Rectal cancer Neg Hx    Sleep apnea Neg Hx     Social History   Tobacco Use   Smoking status: Never    Passive exposure: Never   Smokeless tobacco: Never  Vaping Use   Vaping status: Never Used  Substance Use Topics   Alcohol use: Yes    Alcohol/week: 3.0 standard drinks of alcohol    Types: 3 Standard drinks or equivalent per week    Comment: social   Drug use: Never    ROS   Objective:   Vitals: BP (!) 150/101 (BP Location: Right Arm)   Pulse 87   Temp 98.6 F (37 C)   Resp 20   SpO2 98%   BP Readings from Last 3 Encounters:  05/29/23 (!) 150/101  05/23/23 120/72  04/10/23 (!) 144/78    Physical Exam Constitutional:       General: She is not in acute distress.    Appearance: Normal appearance. She is well-developed and normal weight. She is not ill-appearing, toxic-appearing or diaphoretic.  HENT:     Head: Normocephalic and atraumatic.     Right Ear: Tympanic membrane, ear canal and external ear normal. No drainage or tenderness. No middle ear effusion. There is no impacted cerumen. Tympanic membrane is not erythematous or bulging.     Left Ear: Tympanic membrane, ear canal and external ear normal. No drainage or tenderness.  No middle ear effusion. There is no impacted cerumen. Tympanic membrane is not erythematous or bulging.     Nose: Congestion present. No rhinorrhea.     Mouth/Throat:     Mouth: Mucous membranes are moist. No oral lesions.     Pharynx: No pharyngeal swelling, oropharyngeal exudate, posterior oropharyngeal erythema or uvula swelling.     Tonsils: No tonsillar exudate or tonsillar abscesses.     Comments: Hoarseness of voice noted. Eyes:     General: No scleral icterus.       Right eye: No discharge.        Left eye: No discharge.     Extraocular Movements: Extraocular movements intact.     Right eye: Normal extraocular motion.     Left eye: Normal extraocular motion.     Conjunctiva/sclera: Conjunctivae normal.  Cardiovascular:     Rate and Rhythm: Normal rate and regular rhythm.     Heart sounds: Normal heart sounds. No murmur heard.    No friction rub. No gallop.  Pulmonary:     Effort: Pulmonary effort is normal. No respiratory distress.     Breath sounds: No stridor. Rhonchi (Trace rhonchi along the mid to bibasilar fields) present. No wheezing or rales.  Chest:     Chest wall: No tenderness.  Musculoskeletal:     Cervical back: Normal range of motion and neck supple.  Lymphadenopathy:     Cervical: No cervical adenopathy.  Skin:    General: Skin is warm and dry.  Neurological:     General: No focal deficit present.     Mental Status: She is alert and oriented to  person, place, and time.  Psychiatric:        Mood and Affect: Mood normal.        Behavior: Behavior normal.     Assessment and Plan :   PDMP not reviewed this encounter.  1. Sinobronchitis   2. Allergic rhinitis, unspecified  seasonality, unspecified trigger   3. Essential hypertension    Recommended addressing sinobronchitis with an oral prednisone  course, supportive care.  I advised patient that if the radiology overread shows signs of pneumonia, will use double coverage with Augmentin and azithromycin .  I did provide her with a printed prescription for Augmentin as well as if she does not have any kind of improvement through the weekend with the use of prednisone .  She would feel this prescription for recurrent sinusitis.  Emphasized the need to follow-up with an ENT specialist.  Maintain Allegra.  Hold Mucinex D while on prednisone .  Due to the effects of prednisone  on blood pressure, recommended a 5-day dosing of hydrochlorothiazide  to counteract this.  Counseled patient on potential for adverse effects with medications prescribed/recommended today, ER and return-to-clinic precautions discussed, patient verbalized understanding.    Adolph Hoop, [place] 05/29/23 1314

## 2023-05-29 NOTE — ED Triage Notes (Signed)
 Pt c/o cough x 5 days-felt 6 days ago she had a sinus infection-feeling worse 2 days ago "with rattling in chest"-NAD-steady gait

## 2023-06-05 ENCOUNTER — Encounter (HOSPITAL_COMMUNITY): Payer: Self-pay

## 2023-06-05 ENCOUNTER — Other Ambulatory Visit: Payer: Self-pay | Admitting: Emergency Medicine

## 2023-06-05 ENCOUNTER — Other Ambulatory Visit (HOSPITAL_COMMUNITY): Payer: Self-pay

## 2023-06-05 ENCOUNTER — Other Ambulatory Visit: Payer: Self-pay

## 2023-06-13 ENCOUNTER — Other Ambulatory Visit (HOSPITAL_COMMUNITY): Payer: Self-pay

## 2023-06-21 ENCOUNTER — Other Ambulatory Visit: Payer: Self-pay

## 2023-07-08 ENCOUNTER — Other Ambulatory Visit (HOSPITAL_COMMUNITY): Payer: Self-pay

## 2023-07-17 ENCOUNTER — Other Ambulatory Visit: Payer: Self-pay | Admitting: Emergency Medicine

## 2023-07-17 DIAGNOSIS — F5104 Psychophysiologic insomnia: Secondary | ICD-10-CM

## 2023-07-18 ENCOUNTER — Other Ambulatory Visit (HOSPITAL_COMMUNITY): Payer: Self-pay

## 2023-07-18 MED ORDER — ZOLPIDEM TARTRATE 10 MG PO TABS
10.0000 mg | ORAL_TABLET | Freq: Every evening | ORAL | 1 refills | Status: DC | PRN
  Filled 2023-07-18 – 2023-07-19 (×2): qty 20, 20d supply, fill #0
  Filled 2023-08-15: qty 20, 20d supply, fill #1

## 2023-07-19 ENCOUNTER — Other Ambulatory Visit (HOSPITAL_COMMUNITY): Payer: Self-pay

## 2023-07-21 ENCOUNTER — Ambulatory Visit: Payer: Self-pay | Admitting: General Surgery

## 2023-07-21 ENCOUNTER — Observation Stay (HOSPITAL_COMMUNITY)
Admission: EM | Admit: 2023-07-21 | Discharge: 2023-07-23 | Disposition: A | Discharge status: Home / Self Care | Attending: General Surgery | Admitting: General Surgery

## 2023-07-21 ENCOUNTER — Other Ambulatory Visit: Payer: Self-pay

## 2023-07-21 ENCOUNTER — Emergency Department (HOSPITAL_COMMUNITY)

## 2023-07-21 DIAGNOSIS — K8010 Calculus of gallbladder with chronic cholecystitis without obstruction: Secondary | ICD-10-CM | POA: Diagnosis not present

## 2023-07-21 DIAGNOSIS — K8012 Calculus of gallbladder with acute and chronic cholecystitis without obstruction: Secondary | ICD-10-CM | POA: Diagnosis not present

## 2023-07-21 DIAGNOSIS — K760 Fatty (change of) liver, not elsewhere classified: Secondary | ICD-10-CM | POA: Diagnosis not present

## 2023-07-21 DIAGNOSIS — R1011 Right upper quadrant pain: Secondary | ICD-10-CM | POA: Diagnosis present

## 2023-07-21 DIAGNOSIS — I10 Essential (primary) hypertension: Secondary | ICD-10-CM | POA: Diagnosis not present

## 2023-07-21 DIAGNOSIS — K819 Cholecystitis, unspecified: Secondary | ICD-10-CM

## 2023-07-21 DIAGNOSIS — K810 Acute cholecystitis: Secondary | ICD-10-CM | POA: Diagnosis present

## 2023-07-21 DIAGNOSIS — D649 Anemia, unspecified: Principal | ICD-10-CM | POA: Insufficient documentation

## 2023-07-21 DIAGNOSIS — Z79899 Other long term (current) drug therapy: Secondary | ICD-10-CM | POA: Diagnosis not present

## 2023-07-21 DIAGNOSIS — K8020 Calculus of gallbladder without cholecystitis without obstruction: Secondary | ICD-10-CM | POA: Diagnosis not present

## 2023-07-21 DIAGNOSIS — R1110 Vomiting, unspecified: Secondary | ICD-10-CM | POA: Diagnosis not present

## 2023-07-21 LAB — CBC
HCT: 29.9 % — ABNORMAL LOW (ref 36.0–46.0)
Hemoglobin: 9.5 g/dL — ABNORMAL LOW (ref 12.0–15.0)
MCH: 30.6 pg (ref 26.0–34.0)
MCHC: 31.8 g/dL (ref 30.0–36.0)
MCV: 96.5 fL (ref 80.0–100.0)
Platelets: 230 10*3/uL (ref 150–400)
RBC: 3.1 MIL/uL — ABNORMAL LOW (ref 3.87–5.11)
RDW: 13.8 % (ref 11.5–15.5)
WBC: 9.8 10*3/uL (ref 4.0–10.5)
nRBC: 0 % (ref 0.0–0.2)

## 2023-07-21 LAB — COMPREHENSIVE METABOLIC PANEL WITH GFR
ALT: 21 U/L (ref 0–44)
AST: 33 U/L (ref 15–41)
Albumin: 2.5 g/dL — ABNORMAL LOW (ref 3.5–5.0)
Alkaline Phosphatase: 88 U/L (ref 38–126)
Anion gap: 6 (ref 5–15)
BUN: 9 mg/dL (ref 6–20)
CO2: 30 mmol/L (ref 22–32)
Calcium: 8.2 mg/dL — ABNORMAL LOW (ref 8.9–10.3)
Chloride: 95 mmol/L — ABNORMAL LOW (ref 98–111)
Creatinine, Ser: 0.52 mg/dL (ref 0.44–1.00)
GFR, Estimated: >60 mL/min (ref >60)
Glucose, Bld: 102 mg/dL — ABNORMAL HIGH (ref 70–99)
Potassium: 4.4 mmol/L (ref 3.5–5.1)
Sodium: 131 mmol/L — ABNORMAL LOW (ref 135–145)
Total Bilirubin: 0.9 mg/dL (ref 0.0–1.2)
Total Protein: 5.7 g/dL — ABNORMAL LOW (ref 6.5–8.1)

## 2023-07-21 LAB — URINALYSIS, ROUTINE W REFLEX MICROSCOPIC
Bilirubin Urine: NEGATIVE
Glucose, UA: NEGATIVE mg/dL
Hgb urine dipstick: NEGATIVE
Ketones, ur: NEGATIVE mg/dL
Leukocytes,Ua: NEGATIVE
Nitrite: NEGATIVE
Protein, ur: 30 mg/dL — AB
Specific Gravity, Urine: 1.015 (ref 1.005–1.030)
pH: 7 (ref 5.0–8.0)

## 2023-07-21 LAB — LIPASE, BLOOD: Lipase: 41 U/L (ref 11–51)

## 2023-07-21 MED ORDER — ACETAMINOPHEN 500 MG PO TABS
1000.0000 mg | ORAL_TABLET | Freq: Four times a day (QID) | ORAL | Status: DC
  Administered 2023-07-21 – 2023-07-22 (×3): 1000 mg via ORAL
  Filled 2023-07-21 (×3): qty 2

## 2023-07-21 MED ORDER — SODIUM CHLORIDE 0.9 % IV SOLN
2.0000 g | Freq: Once | INTRAVENOUS | Status: AC
  Administered 2023-07-21: 2 g via INTRAVENOUS
  Filled 2023-07-21: qty 20

## 2023-07-21 MED ORDER — ONDANSETRON HCL 4 MG/2ML IJ SOLN
4.0000 mg | Freq: Once | INTRAMUSCULAR | Status: AC
  Administered 2023-07-21: 4 mg via INTRAVENOUS
  Filled 2023-07-21: qty 2

## 2023-07-21 MED ORDER — SODIUM CHLORIDE 0.9 % IV SOLN
2.0000 g | INTRAVENOUS | Status: DC
  Administered 2023-07-22: 2 g via INTRAVENOUS
  Filled 2023-07-21: qty 20

## 2023-07-21 MED ORDER — ENOXAPARIN SODIUM 40 MG/0.4ML IJ SOSY
40.0000 mg | PREFILLED_SYRINGE | INTRAMUSCULAR | Status: DC
  Filled 2023-07-21: qty 0.4

## 2023-07-21 MED ORDER — SODIUM CHLORIDE 0.9 % IV SOLN: 2.0000 g | INTRAVENOUS | Status: DC

## 2023-07-21 MED ORDER — OXYCODONE HCL 5 MG PO TABS: 5.0000 mg | ORAL_TABLET | ORAL | Status: DC | PRN

## 2023-07-21 MED ORDER — LACTATED RINGERS IV SOLN: INTRAVENOUS | Status: DC

## 2023-07-21 MED ORDER — KETOROLAC TROMETHAMINE 15 MG/ML IJ SOLN
15.0000 mg | Freq: Once | INTRAMUSCULAR | Status: AC
  Administered 2023-07-21: 15 mg via INTRAVENOUS
  Filled 2023-07-21: qty 1

## 2023-07-21 NOTE — ED Provider Notes (Signed)
 [place] EMERGENCY DEPARTMENT AT [facility] Provider Note   CSN: [id number] Arrival date & time: 07/21/23  0430     Patient presents with: Abdominal Pain   Deanna Cook is a 53 y.o. female.   53 year old female with a history of hysterectomy and appendectomy presents emergency department with upper abdominal pain.  Patient reports that last night at 11 PM she started experiencing this bandlike sensation of pressure in her epigastrium.  Does radiate to her back.  Has been constant but does have waves where it will get worse.  Also had multiple episodes of emesis.  No fevers.  Did have some loose stools as well.  Reports that the pain has improved somewhat at this point time.       Prior to Admission medications   Medication Sig Start Date End Date Taking? Authorizing Provider  amLODipine  (NORVASC ) 10 MG tablet Take 1 tablet (10 mg total) by mouth daily. 12/19/22   Sagardia, Miguel Jose, MD  amoxicillin -clavulanate (AUGMENTIN ) 875-125 MG tablet Take 1 tablet by mouth 2 (two) times daily. 05/29/23   Christopher Savannah, PA-C  ascorbic acid  (VITAMIN C) 500 MG tablet Take 1 tablet (500 mg total) by mouth daily. 11/10/22   Sagardia, Miguel Jose, MD  Cyanocobalamin (B-12) 1000 MCG TABS Take 1 tablet by mouth as needed.    [provider]  estradiol  (ESTRACE  VAGINAL) 0.1 MG/GM vaginal cream Apply 1/2 gram to vulva nightly for 2 weeks then decrease to 1/2 gram to vulva two nights a week. Do not use applicator. 05/23/23   Hines, Vera GAILS, MD  estradiol  (VIVELLE -DOT) 0.075 MG/24HR Place 1 patch onto the skin 2 (two) times a week. 05/23/23   Hines, Vera GAILS, MD  fexofenadine (ALLEGRA) 180 MG tablet Take 180 mg by mouth as needed.    [provider]  fluticasone  (FLONASE) 50 MCG/ACT nasal spray Place into both nostrils as needed.    [provider]  gabapentin  (NEURONTIN ) 100 MG capsule Take 1 capsule (100 mg total) by mouth at bedtime for 3 days, THEN 2 capsules  (200 mg total) at bedtime for 3 days, THEN 3 capsules (300 mg total) at bedtime. 05/23/23 11/25/23  Dallie Vera GAILS, MD  hydrochlorothiazide  (HYDRODIURIL ) 12.5 MG tablet Take 1 tablet (12.5 mg total) by mouth daily. 05/29/23   Christopher Savannah, PA-C  Multiple Vitamin (MULTIVITAMIN) capsule Take 1 capsule by mouth as needed.    [provider]  predniSONE  (DELTASONE ) 20 MG tablet Take 2 tablets (40 mg total) by mouth daily with breakfast. 05/29/23   Christopher Savannah, PA-C  promethazine -dextromethorphan (PROMETHAZINE -DM) 6.25-15 MG/5ML syrup Take 5 mLs by mouth at bedtime as needed for cough. 05/29/23   Christopher Savannah, PA-C  Sodium Fluoride  1.1 % PSTE Brush with paste twice daily; morning and at night 06/07/22     Vitamin D , Ergocalciferol , (DRISDOL ) 1.25 MG (50000 UNIT) CAPS capsule Take 1 capsule (50,000 Units total) by mouth every 7 (seven) days. 03/12/23   Purcell Emil Schanz, MD  zolpidem  (AMBIEN ) 10 MG tablet Take 1 tablet (10 mg total) by mouth at bedtime as needed for sleep. 07/18/23   Purcell Emil Schanz, MD    Allergies: Patient has no known allergies.    Review of Systems  Updated Vital Signs BP (!) 168/84 (BP Location: Right Arm)   Pulse 62   Temp 98 F (36.7 C) (Oral)   Resp 16   SpO2 100%   Physical Exam Vitals and nursing note reviewed.  Constitutional:  General: She is not in acute distress.    Appearance: She is well-developed.  HENT:     Head: Normocephalic and atraumatic.     Right Ear: External ear normal.     Left Ear: External ear normal.     Nose: Nose normal.   Eyes:     Extraocular Movements: Extraocular movements intact.     Conjunctiva/sclera: Conjunctivae normal.     Pupils: Pupils are equal, round, and reactive to light.   Pulmonary:     Effort: Pulmonary effort is normal. No respiratory distress.  Abdominal:     General: Abdomen is flat. There is no distension.     Palpations: Abdomen is soft. There is no mass.     Tenderness: There is abdominal  tenderness (Minimal right upper quadrant.  Negative Murphy sign.). There is no guarding.   Musculoskeletal:     Cervical back: Normal range of motion and neck supple.   Skin:    General: Skin is warm and dry.   Neurological:     Mental Status: She is alert and oriented to person, place, and time. Mental status is at baseline.   Psychiatric:        Mood and Affect: Mood normal.     (all labs ordered are listed, but only abnormal results are displayed) Labs Reviewed  COMPREHENSIVE METABOLIC PANEL WITH GFR - Abnormal; Notable for the following components:      Result Value   Sodium 131 (*)    Chloride 95 (*)    Glucose, Bld 102 (*)    Calcium 8.2 (*)    Total Protein 5.7 (*)    Albumin 2.5 (*)    All other components within normal limits  CBC - Abnormal; Notable for the following components:   RBC 3.10 (*)    Hemoglobin 9.5 (*)    HCT 29.9 (*)    All other components within normal limits  URINALYSIS, ROUTINE W REFLEX MICROSCOPIC - Abnormal; Notable for the following components:   APPearance HAZY (*)    Protein, ur 30 (*)    Bacteria, UA FEW (*)    All other components within normal limits  LIPASE, BLOOD  CBC  CREATININE, SERUM    EKG: None  Radiology: US  Abdomen Limited RUQ (LIVER/GB) Result Date: 07/21/2023 CLINICAL DATA:  RIGHT upper quadrant pain and vomiting. EXAM: ULTRASOUND ABDOMEN LIMITED RIGHT UPPER QUADRANT COMPARISON:  None Available. FINDINGS: Gallbladder: 1 cm gallstone towards the neck of the gallbladder. No gallbladder distension. There is small amount of pericholecystic fluid. Negative sonographic Murphy's sign. Common bile duct: Diameter: Normal at 2 mm Liver: Increased hepatic echogenicity. No focal lesion. No duct dilatation portal vein is patent on color Doppler imaging with normal direction of blood flow towards the liver. Other: None. IMPRESSION: 1. Cholelithiasis with small amount of pericholecystic fluid. Negative sonographic Murphy's sign. 2. No  biliary dilatation. 3. Hepatic steatosis. Electronically Signed   By: Jackquline Boxer M.D.   On: 07/21/2023 11:27     Procedures   Medications Ordered in the ED  enoxaparin (LOVENOX) injection 40 mg (has no administration in time range)  lactated ringers infusion (has no administration in time range)  acetaminophen  (TYLENOL ) tablet 1,000 mg (has no administration in time range)  oxyCODONE  (Oxy IR/ROXICODONE ) immediate release tablet 5-10 mg (has no administration in time range)  cefTRIAXone (ROCEPHIN) 2 g in sodium chloride  0.9 % 100 mL IVPB (has no administration in time range)  ketorolac (TORADOL) 15 MG/ML injection 15 mg (15 mg  Intravenous Given 07/21/23 0951)  ondansetron (ZOFRAN) injection 4 mg (4 mg Intravenous Given 07/21/23 0950)  cefTRIAXone (ROCEPHIN) 2 g in sodium chloride  0.9 % 100 mL IVPB (0 g Intravenous Stopped 07/21/23 1239)    Clinical Course as of 07/21/23 1444  Sun Jul 21, 2023  1136 Dr Polly from general surgery consulted. Will see the patient shortly [RP]  1416 Surgeries talk to the patient.  They feel that she should have a cholecystectomy but she is very reluctant to have her gallbladder taken out.  After they had extensive discussion they are recommending a p.o. challenge for the patient if her symptoms return to admit her for cholecystectomy and if not to be discharged home and follow-up for biliary colic. [RP]    Clinical Course User Index [RP] Yolande Lamar BROCKS, MD                                 Medical Decision Making Amount and/or Complexity of Data Reviewed Labs: ordered. Radiology: ordered.  Risk Prescription drug management. Decision regarding hospitalization.   53 year old female with history of hysterectomy and appendectomy presents emergency department with upper abdominal pain  Initial Ddx:  Cholecystitis, biliary colic, gastritis, pancreatitis  MDM/Course:  Patient presents emergency department with upper abdominal pain that appears to  be colicky.  Does have some right upper quadrant tenderness to palpation on exam but negative Murphy sign.  Underwent an ultrasound which does show thickened gallbladder wall with pericholecystic fluid, and a gallstone in the gallbladder neck.  Did have a negative Murphy sign.  Was discussed with surgery who will admit the patient and take to the OR tomorrow.  Started on antibiotics.  Was also incidentally found to have anemia which she was informed of and will need to follow-up with her primary doctor about.  Has not had any vaginal bleeding this already had a hysterectomy.  Denies any melena or hematochezia.  Has had a colonoscopy that she reports was normal.  This patient presents to the ED for concern of complaints listed in HPI, this involves an extensive number of treatment options, and is a complaint that carries with it a high risk of complications and morbidity. Disposition including potential need for admission considered.   Dispo: Admit to Floor  Additional history obtained from spouse Records reviewed Outpatient Clinic Notes The following labs were independently interpreted: Chemistry and show no acute abnormality I independently reviewed the following imaging with scope of interpretation limited to determining acute life threatening conditions related to emergency care: Right upper quadrant ultrasound and agree with the radiologist interpretation with the following exceptions: none I personally reviewed and interpreted cardiac monitoring: normal sinus rhythm  I have reviewed the patients home medications and made adjustments as needed Consults: Hospitalist  Portions of this note were generated with Scientist, clinical (histocompatibility and immunogenetics). Dictation errors may occur despite best attempts at proofreading.     Final diagnoses:  Anemia, unspecified type  Cholecystitis    ED Discharge Orders     None          Yolande Lamar BROCKS, MD 07/21/23 1444

## 2023-07-21 NOTE — ED Triage Notes (Signed)
 Patient reports pain across abdomen with emesis onset last night , no fever or diarrhea .

## 2023-07-21 NOTE — H&P (Addendum)
 Deanna Cook April 19, 1970  [id number].    HPI:  53 y/o F w/ a hx of HTN who presents to the ED with abdominal pain and nausea. She reports that the pain started suddenly in the epigastric region around 11pm, with radiation to the RUQ. She experienced emesis shortly after. RUQ US  shows cholelithiasis with small amount of pericholecystic fluid. Labs notable for a normal WBC and Hb of 9.5. LFTs WNL  On exam, patient is resting comfortably. She reports that her pain has improved significantly. She is hungry.   She does not accept blood transfusions.  Prior abdominal surgical history includes open appendectomy and hysterectomy.   ROS: Review of Systems  Constitutional: Negative.   HENT: Negative.    Eyes: Negative.   Respiratory: Negative.    Cardiovascular: Negative.   Gastrointestinal:  Positive for abdominal pain, diarrhea and nausea.  Genitourinary: Negative.   Musculoskeletal: Negative.   Skin: Negative.   Neurological: Negative.   Endo/Heme/Allergies: Negative.   Psychiatric/Behavioral: Negative.      Family History  Problem Relation Age of Onset   Colon polyps Mother    Hypertension Mother    Colon cancer Mother 77   Colon polyps Father    Heart attack Father    Diabetes Mellitus I Father    Kidney failure Father    Thyroid  cancer Sister    Fibromyalgia Sister    Breast cancer Maternal Aunt    Breast cancer Cousin    Breast cancer Cousin    Kidney disease Brother    Esophageal cancer Neg Hx    Stomach cancer Neg Hx    Rectal cancer Neg Hx    Sleep apnea Neg Hx     Past Medical History:  Diagnosis Date   Allergic rhinitis    Allergy     Fibroid    Hypertension     Past Surgical History:  Procedure Laterality Date   ABDOMINAL HYSTERECTOMY     LAVH-2014   APPENDECTOMY  1989   COLONOSCOPY  2005   about 20 years ago in [place] -normal exam   EYE SURGERY     Hole in retina    Social History:  reports that she has never smoked. She has never been  exposed to tobacco smoke. She has never used smokeless tobacco. She reports current alcohol use of about 3.0 standard drinks of alcohol per week. She reports that she does not use drugs.  Allergies: No Known Allergies  (Not in a hospital admission)   Physical Exam: Blood pressure (!) 161/93, pulse 73, temperature 98.2 F (36.8 C), temperature source Oral, resp. rate 20, SpO2 100%. Gen: female, NAD Abd: soft, non-distended, TTP in the RUQ, no rebound/guarding, no peritoneal signs  Results for orders placed or performed during the hospital encounter of 07/21/23 (from the past 48 hours)  Urinalysis, Routine w reflex microscopic -Urine, Clean Catch     Status: Abnormal   Collection Time: 07/21/23  4:35 AM  Result Value Ref Range   Color, Urine YELLOW YELLOW   APPearance HAZY (A) CLEAR   Specific Gravity, Urine 1.015 1.005 - 1.030   pH 7.0 5.0 - 8.0   Glucose, UA NEGATIVE NEGATIVE mg/dL   Hgb urine dipstick NEGATIVE NEGATIVE   Bilirubin Urine NEGATIVE NEGATIVE   Ketones, ur NEGATIVE NEGATIVE mg/dL   Protein, ur 30 (A) NEGATIVE mg/dL   Nitrite NEGATIVE NEGATIVE   Leukocytes,Ua NEGATIVE NEGATIVE   RBC / HPF 0-5 0 - 5 RBC/hpf   WBC, UA 0-5 0 -  5 WBC/hpf   Bacteria, UA FEW (A) NONE SEEN   Squamous Epithelial / HPF 0-5 0 - 5 /HPF   Mucus PRESENT     Comment: Performed at [facility], [address] [place], [place] [address]  Lipase, blood     Status: None   Collection Time: 07/21/23  4:42 AM  Result Value Ref Range   Lipase 41 11 - 51 U/L    Comment: Performed at [facility], [address], [place], [place] [address]  Comprehensive metabolic panel     Status: Abnormal   Collection Time: 07/21/23  4:42 AM  Result Value Ref Range   Sodium 131 (L) 135 - 145 mmol/L   Potassium 4.4 3.5 - 5.1 mmol/L   Chloride 95 (L) 98 - 111 mmol/L   CO2 30 22 - 32 mmol/L   Glucose, Bld 102 (H) 70 - 99 mg/dL    Comment: Glucose reference range applies only to samples taken after  fasting for at least 8 hours.   BUN 9 6 - 20 mg/dL   Creatinine, Ser 9.47 0.44 - 1.00 mg/dL   Calcium 8.2 (L) 8.9 - 10.3 mg/dL   Total Protein 5.7 (L) 6.5 - 8.1 g/dL   Albumin 2.5 (L) 3.5 - 5.0 g/dL   AST 33 15 - 41 U/L   ALT 21 0 - 44 U/L   Alkaline Phosphatase 88 38 - 126 U/L   Total Bilirubin 0.9 0.0 - 1.2 mg/dL   GFR, Estimated >39 >39 mL/min    Comment: (NOTE) Calculated using the CKD-EPI Creatinine Equation (2021)    Anion gap 6 5 - 15    Comment: Performed at [facility], [address], [place], [place] [address]  CBC     Status: Abnormal   Collection Time: 07/21/23  4:42 AM  Result Value Ref Range   WBC 9.8 4.0 - 10.5 K/uL   RBC 3.10 (L) 3.87 - 5.11 MIL/uL   Hemoglobin 9.5 (L) 12.0 - 15.0 g/dL   HCT 70.0 (L) 63.9 - 53.9 %   MCV 96.5 80.0 - 100.0 fL   MCH 30.6 26.0 - 34.0 pg   MCHC 31.8 30.0 - 36.0 g/dL   RDW 86.1 88.4 - 84.4 %   Platelets 230 150 - 400 K/uL   nRBC 0.0 0.0 - 0.2 %    Comment: Performed at [facility], [address], [place], [place] [address]   US  Abdomen Limited RUQ (LIVER/GB) Result Date: 07/21/2023 CLINICAL DATA:  RIGHT upper quadrant pain and vomiting. EXAM: ULTRASOUND ABDOMEN LIMITED RIGHT UPPER QUADRANT COMPARISON:  None Available. FINDINGS: Gallbladder: 1 cm gallstone towards the neck of the gallbladder. No gallbladder distension. There is small amount of pericholecystic fluid. Negative sonographic Murphy's sign. Common bile duct: Diameter: Normal at 2 mm Liver: Increased hepatic echogenicity. No focal lesion. No duct dilatation portal vein is patent on color Doppler imaging with normal direction of blood flow towards the liver. Other: None. IMPRESSION: 1. Cholelithiasis with small amount of pericholecystic fluid. Negative sonographic Murphy's sign. 2. No biliary dilatation. 3. Hepatic steatosis. Electronically Signed   By: Jackquline Boxer M.D.   On: 07/21/2023 11:27    Assessment/Plan 53 y/o F w/ pain, nausea, and RUQ  tenderness  - We discussed the anatomy and physiology of the biliary system, including the significance of gallstones and gallbladder disease. I do believe that based on her history, exam, and imaging that she would likely benefit from cholecystectomy.  However, she does not have a leukocytosis, is AF, and her abdominal pain has improved. In addition, she would not accept a transfusion and is therefore less interested in surgery. There is no indication for urgent surgical intervention.  - Will admit to [facility], med/surg - [facility] now. NPO at [facility] - Continue abx  Cordella DELENA Polly Marlis Cheron Surgery 07/21/2023, 1:26 PM Please see Amion for pager number during day hours 7:00am-4:30pm or 7:00am -11:30am on weekends

## 2023-07-21 NOTE — ED Notes (Signed)
 Pt requested it be known to minimize blood draws as much as possible for fear of losing too much blood.

## 2023-07-22 ENCOUNTER — Observation Stay (HOSPITAL_COMMUNITY): Admitting: Certified Registered"

## 2023-07-22 ENCOUNTER — Encounter (HOSPITAL_COMMUNITY): Payer: Self-pay

## 2023-07-22 ENCOUNTER — Encounter (HOSPITAL_COMMUNITY)
Admission: EM | Disposition: A | Discharge status: Home / Self Care | Payer: Self-pay | Source: Home / Self Care | Attending: Emergency Medicine

## 2023-07-22 ENCOUNTER — Other Ambulatory Visit: Payer: Self-pay

## 2023-07-22 DIAGNOSIS — K8010 Calculus of gallbladder with chronic cholecystitis without obstruction: Secondary | ICD-10-CM | POA: Diagnosis not present

## 2023-07-22 DIAGNOSIS — K819 Cholecystitis, unspecified: Secondary | ICD-10-CM | POA: Diagnosis not present

## 2023-07-22 HISTORY — PX: INDOCYANINE GREEN FLUORESCENCE IMAGING (ICG): SHX7595

## 2023-07-22 HISTORY — PX: CHOLECYSTECTOMY: SHX55

## 2023-07-22 LAB — CBC
HCT: 36.6 % (ref 36.0–46.0)
Hemoglobin: 12.6 g/dL (ref 12.0–15.0)
MCH: 30.6 pg (ref 26.0–34.0)
MCHC: 34.4 g/dL (ref 30.0–36.0)
MCV: 88.8 fL (ref 80.0–100.0)
Platelets: 275 10*3/uL (ref 150–400)
RBC: 4.12 MIL/uL (ref 3.87–5.11)
RDW: 12.7 % (ref 11.5–15.5)
WBC: 7.6 10*3/uL (ref 4.0–10.5)
nRBC: 0 % (ref 0.0–0.2)

## 2023-07-22 LAB — COMPREHENSIVE METABOLIC PANEL WITH GFR
ALT: 17 U/L (ref 0–44)
AST: 18 U/L (ref 15–41)
Albumin: 3.1 g/dL — ABNORMAL LOW (ref 3.5–5.0)
Alkaline Phosphatase: 51 U/L (ref 38–126)
Anion gap: 15 (ref 5–15)
BUN: 8 mg/dL (ref 6–20)
CO2: 18 mmol/L — ABNORMAL LOW (ref 22–32)
Calcium: 8.2 mg/dL — ABNORMAL LOW (ref 8.9–10.3)
Chloride: 105 mmol/L (ref 98–111)
Creatinine, Ser: 0.93 mg/dL (ref 0.44–1.00)
GFR, Estimated: >60 mL/min (ref >60)
Glucose, Bld: 105 mg/dL — ABNORMAL HIGH (ref 70–99)
Potassium: 3.6 mmol/L (ref 3.5–5.1)
Sodium: 138 mmol/L (ref 135–145)
Total Bilirubin: 1 mg/dL (ref 0.0–1.2)
Total Protein: 6.3 g/dL — ABNORMAL LOW (ref 6.5–8.1)

## 2023-07-22 LAB — PROTIME-INR
INR: 1.1 (ref 0.8–1.2)
Prothrombin Time: 14 s (ref 11.4–15.2)

## 2023-07-22 LAB — SURGICAL PCR SCREEN
MRSA, PCR: NEGATIVE
Staphylococcus aureus: NEGATIVE

## 2023-07-22 LAB — NO BLOOD PRODUCTS

## 2023-07-22 SURGERY — LAPAROSCOPIC CHOLECYSTECTOMY
Anesthesia: General | Site: Abdomen

## 2023-07-22 MED ORDER — CHLORHEXIDINE GLUCONATE 0.12 % MT SOLN
15.0000 mL | Freq: Once | OROMUCOSAL | Status: AC
  Administered 2023-07-22: 15 mL via OROMUCOSAL
  Filled 2023-07-22: qty 15

## 2023-07-22 MED ORDER — OXYCODONE HCL 5 MG PO TABS: 5.0000 mg | ORAL_TABLET | Freq: Once | ORAL | Status: DC | PRN

## 2023-07-22 MED ORDER — CHLORHEXIDINE GLUCONATE CLOTH 2 % EX PADS
6.0000 | MEDICATED_PAD | Freq: Once | CUTANEOUS | Status: AC
  Administered 2023-07-22: 6 via TOPICAL

## 2023-07-22 MED ORDER — FENTANYL CITRATE (PF) 100 MCG/2ML IJ SOLN
25.0000 ug | INTRAMUSCULAR | Status: DC | PRN
  Administered 2023-07-22: 50 ug via INTRAVENOUS

## 2023-07-22 MED ORDER — SODIUM CHLORIDE 0.9 % IR SOLN
Status: DC | PRN
  Administered 2023-07-22: 1000 mL

## 2023-07-22 MED ORDER — SUGAMMADEX SODIUM 200 MG/2ML IV SOLN
INTRAVENOUS | Status: DC | PRN
  Administered 2023-07-22: 400 mg via INTRAVENOUS

## 2023-07-22 MED ORDER — MIDAZOLAM HCL 2 MG/2ML IJ SOLN
INTRAMUSCULAR | Status: DC | PRN
  Administered 2023-07-22: 2 mg via INTRAVENOUS

## 2023-07-22 MED ORDER — ONDANSETRON 4 MG PO TBDP: 4.0000 mg | ORAL_TABLET | Freq: Four times a day (QID) | ORAL | Status: DC | PRN

## 2023-07-22 MED ORDER — INDOCYANINE GREEN 25 MG IV SOLR
2.5000 mg | Freq: Once | INTRAVENOUS | Status: AC
  Administered 2023-07-22: 2.5 mg via INTRAVENOUS
  Filled 2023-07-22: qty 10

## 2023-07-22 MED ORDER — ACETAMINOPHEN 500 MG PO TABS
1000.0000 mg | ORAL_TABLET | Freq: Four times a day (QID) | ORAL | Status: DC
  Administered 2023-07-22 – 2023-07-23 (×3): 1000 mg via ORAL
  Filled 2023-07-22 (×3): qty 2

## 2023-07-22 MED ORDER — AMLODIPINE BESYLATE 10 MG PO TABS
10.0000 mg | ORAL_TABLET | Freq: Every day | ORAL | Status: DC
  Filled 2023-07-22: qty 1

## 2023-07-22 MED ORDER — FENTANYL CITRATE (PF) 100 MCG/2ML IJ SOLN
INTRAMUSCULAR | Status: AC
Start: 2023-07-22 — End: 2023-07-22
  Filled 2023-07-22: qty 2

## 2023-07-22 MED ORDER — SIMETHICONE 80 MG PO CHEW: 40.0000 mg | CHEWABLE_TABLET | Freq: Four times a day (QID) | ORAL | Status: DC | PRN

## 2023-07-22 MED ORDER — OXYCODONE HCL 5 MG/5ML PO SOLN: 5.0000 mg | Freq: Once | ORAL | Status: DC | PRN

## 2023-07-22 MED ORDER — MELATONIN 3 MG PO TABS
3.0000 mg | ORAL_TABLET | Freq: Every evening | ORAL | Status: DC | PRN
Start: 2023-07-22 — End: 2023-07-23

## 2023-07-22 MED ORDER — LORATADINE 10 MG PO TABS
10.0000 mg | ORAL_TABLET | Freq: Every day | ORAL | Status: DC
  Administered 2023-07-23: 10 mg via ORAL
  Filled 2023-07-22: qty 1

## 2023-07-22 MED ORDER — MORPHINE SULFATE (PF) 2 MG/ML IV SOLN
1.0000 mg | INTRAVENOUS | Status: DC | PRN
  Administered 2023-07-22: 2 mg via INTRAVENOUS
  Filled 2023-07-22: qty 1

## 2023-07-22 MED ORDER — DOCUSATE SODIUM 100 MG PO CAPS
100.0000 mg | ORAL_CAPSULE | Freq: Two times a day (BID) | ORAL | Status: DC
Start: 2023-07-22 — End: 2023-07-23
  Administered 2023-07-23: 100 mg via ORAL
  Filled 2023-07-22 (×2): qty 1

## 2023-07-22 MED ORDER — OXYCODONE HCL 5 MG PO TABS
5.0000 mg | ORAL_TABLET | ORAL | Status: DC | PRN
  Administered 2023-07-22 – 2023-07-23 (×4): 10 mg via ORAL
  Filled 2023-07-22 (×4): qty 2

## 2023-07-22 MED ORDER — LACTATED RINGERS IV SOLN: INTRAVENOUS | Status: DC

## 2023-07-22 MED ORDER — ONDANSETRON HCL 4 MG/2ML IJ SOLN: 4.0000 mg | Freq: Four times a day (QID) | INTRAMUSCULAR | Status: DC | PRN

## 2023-07-22 MED ORDER — DIPHENHYDRAMINE HCL 50 MG/ML IJ SOLN: 12.5000 mg | Freq: Four times a day (QID) | INTRAMUSCULAR | Status: DC | PRN

## 2023-07-22 MED ORDER — ROCURONIUM BROMIDE 10 MG/ML (PF) SYRINGE
PREFILLED_SYRINGE | INTRAVENOUS | Status: DC | PRN
  Administered 2023-07-22: 60 mg via INTRAVENOUS
  Administered 2023-07-22: 40 mg via INTRAVENOUS

## 2023-07-22 MED ORDER — KETOROLAC TROMETHAMINE 30 MG/ML IJ SOLN
INTRAMUSCULAR | Status: AC
  Filled 2023-07-22: qty 1

## 2023-07-22 MED ORDER — KCL IN DEXTROSE-NACL 20-5-0.45 MEQ/L-%-% IV SOLN
INTRAVENOUS | Status: DC
  Filled 2023-07-22: qty 1000

## 2023-07-22 MED ORDER — INDOCYANINE GREEN 25 MG IV SOLR: 1.2500 mg | Freq: Once | INTRAVENOUS | Status: AC

## 2023-07-22 MED ORDER — DEXAMETHASONE SODIUM PHOSPHATE 10 MG/ML IJ SOLN
INTRAMUSCULAR | Status: AC
  Filled 2023-07-22: qty 1

## 2023-07-22 MED ORDER — MIDAZOLAM HCL 2 MG/2ML IJ SOLN
INTRAMUSCULAR | Status: AC
  Filled 2023-07-22: qty 2

## 2023-07-22 MED ORDER — PHENYLEPHRINE 80 MCG/ML (10ML) SYRINGE FOR IV PUSH (FOR BLOOD PRESSURE SUPPORT)
PREFILLED_SYRINGE | INTRAVENOUS | Status: DC | PRN
  Administered 2023-07-22: 160 ug via INTRAVENOUS

## 2023-07-22 MED ORDER — FLUTICASONE PROPIONATE 50 MCG/ACT NA SUSP: 1.0000 | Freq: Every day | NASAL | Status: DC | PRN

## 2023-07-22 MED ORDER — DEXAMETHASONE SODIUM PHOSPHATE 10 MG/ML IJ SOLN
INTRAMUSCULAR | Status: DC | PRN
  Administered 2023-07-22: 5 mg via INTRAVENOUS

## 2023-07-22 MED ORDER — FENTANYL CITRATE (PF) 250 MCG/5ML IJ SOLN
INTRAMUSCULAR | Status: AC
  Filled 2023-07-22: qty 5

## 2023-07-22 MED ORDER — 0.9 % SODIUM CHLORIDE (POUR BTL) OPTIME
TOPICAL | Status: DC | PRN
  Administered 2023-07-22: 1000 mL

## 2023-07-22 MED ORDER — ACETAMINOPHEN 10 MG/ML IV SOLN: 1000.0000 mg | Freq: Once | INTRAVENOUS | Status: DC | PRN

## 2023-07-22 MED ORDER — PROPOFOL 10 MG/ML IV BOLUS
INTRAVENOUS | Status: DC | PRN
  Administered 2023-07-22: 160 mg via INTRAVENOUS

## 2023-07-22 MED ORDER — DIPHENHYDRAMINE HCL 12.5 MG/5ML PO ELIX: 12.5000 mg | ORAL_SOLUTION | Freq: Four times a day (QID) | ORAL | Status: DC | PRN

## 2023-07-22 MED ORDER — ONDANSETRON HCL 4 MG/2ML IJ SOLN
INTRAMUSCULAR | Status: AC
  Filled 2023-07-22: qty 2

## 2023-07-22 MED ORDER — ONDANSETRON HCL 4 MG/2ML IJ SOLN
INTRAMUSCULAR | Status: DC | PRN
  Administered 2023-07-22: 4 mg via INTRAVENOUS

## 2023-07-22 MED ORDER — CHLORHEXIDINE GLUCONATE CLOTH 2 % EX PADS: 6.0000 | MEDICATED_PAD | Freq: Once | CUTANEOUS | Status: DC

## 2023-07-22 MED ORDER — FENTANYL CITRATE (PF) 250 MCG/5ML IJ SOLN
INTRAMUSCULAR | Status: DC | PRN
Start: 2023-07-22 — End: 2023-07-22
  Administered 2023-07-22 (×3): 50 ug via INTRAVENOUS

## 2023-07-22 MED ORDER — BUPIVACAINE-EPINEPHRINE (PF) 0.25% -1:200000 IJ SOLN
INTRAMUSCULAR | Status: AC
  Filled 2023-07-22: qty 30

## 2023-07-22 MED ORDER — MENTHOL 3 MG MT LOZG: 1.0000 | LOZENGE | OROMUCOSAL | Status: DC | PRN

## 2023-07-22 MED ORDER — PROPOFOL 10 MG/ML IV BOLUS
INTRAVENOUS | Status: AC
  Filled 2023-07-22: qty 20

## 2023-07-22 MED ORDER — BUPIVACAINE-EPINEPHRINE 0.25% -1:200000 IJ SOLN
INTRAMUSCULAR | Status: DC | PRN
  Administered 2023-07-22: 18 mL

## 2023-07-22 MED ORDER — HEPARIN SODIUM (PORCINE) 5000 UNIT/ML IJ SOLN
5000.0000 [IU] | Freq: Three times a day (TID) | INTRAMUSCULAR | Status: DC
  Filled 2023-07-22: qty 1

## 2023-07-22 MED ORDER — ROCURONIUM BROMIDE 10 MG/ML (PF) SYRINGE
PREFILLED_SYRINGE | INTRAVENOUS | Status: AC
  Filled 2023-07-22: qty 10

## 2023-07-22 MED ORDER — ORAL CARE MOUTH RINSE: 15.0000 mL | Freq: Once | OROMUCOSAL | Status: AC

## 2023-07-22 MED ORDER — LIDOCAINE 2% (20 MG/ML) 5 ML SYRINGE
INTRAMUSCULAR | Status: AC
Start: 2023-07-22 — End: 2023-07-22
  Filled 2023-07-22: qty 5

## 2023-07-22 MED ORDER — KETOROLAC TROMETHAMINE 30 MG/ML IJ SOLN
INTRAMUSCULAR | Status: DC | PRN
  Administered 2023-07-22: 15 mg via INTRAVENOUS

## 2023-07-22 MED ORDER — LIDOCAINE 2% (20 MG/ML) 5 ML SYRINGE
INTRAMUSCULAR | Status: DC | PRN
  Administered 2023-07-22: 60 mg via INTRAVENOUS

## 2023-07-22 SURGICAL SUPPLY — 36 items
BAG COUNTER SPONGE SURGICOUNT (BAG) ×1 IMPLANT
BENZOIN TINCTURE PRP APPL 2/3 (GAUZE/BANDAGES/DRESSINGS) ×1 IMPLANT
BLADE CLIPPER SURG (BLADE) IMPLANT
BNDG ADH 1X3 SHEER STRL LF (GAUZE/BANDAGES/DRESSINGS) ×3 IMPLANT
CANISTER SUCTION 3000ML PPV (SUCTIONS) ×1 IMPLANT
CHLORAPREP W/TINT 26 (MISCELLANEOUS) ×1 IMPLANT
CLIP APPLIE 5 13 M/L LIGAMAX5 (MISCELLANEOUS) ×1 IMPLANT
CNTNR URN SCR LID CUP LEK RST (MISCELLANEOUS) IMPLANT
COVER SURGICAL LIGHT HANDLE (MISCELLANEOUS) ×1 IMPLANT
DERMABOND ADVANCED .7 DNX12 (GAUZE/BANDAGES/DRESSINGS) IMPLANT
ELECTRODE REM PT RTRN 9FT ADLT (ELECTROSURGICAL) ×1 IMPLANT
GAUZE SPONGE 2X2 8PLY STRL LF (GAUZE/BANDAGES/DRESSINGS) ×1 IMPLANT
GLOVE PI ORTHO PRO STRL 7.5 (GLOVE) ×1 IMPLANT
GOWN STRL REUS W/ TWL LRG LVL3 (GOWN DISPOSABLE) ×2 IMPLANT
GOWN STRL REUS W/ TWL XL LVL3 (GOWN DISPOSABLE) ×1 IMPLANT
GRASPER SUT TROCAR 14GX15 (MISCELLANEOUS) IMPLANT
IRRIGATION SUCT STRKRFLW 2 WTP (MISCELLANEOUS) ×1 IMPLANT
KIT BASIN OR (CUSTOM PROCEDURE TRAY) ×1 IMPLANT
KIT IMAGING PINPOINTPAQ (MISCELLANEOUS) IMPLANT
KIT TURNOVER KIT B (KITS) ×1 IMPLANT
NS IRRIG 1000ML POUR BTL (IV SOLUTION) ×1 IMPLANT
PAD ARMBOARD POSITIONER FOAM (MISCELLANEOUS) ×1 IMPLANT
POUCH RETRIEVAL ECOSAC 10 (ENDOMECHANICALS) ×1 IMPLANT
SCISSORS LAP 5X35 DISP (ENDOMECHANICALS) ×1 IMPLANT
SET TUBE SMOKE EVAC HIGH FLOW (TUBING) ×1 IMPLANT
SLEEVE ADV FIXATION 5X100MM (TROCAR) ×2 IMPLANT
SPECIMEN JAR SMALL (MISCELLANEOUS) ×1 IMPLANT
SUT MNCRL AB 4-0 PS2 18 (SUTURE) ×1 IMPLANT
SUT VICRYL 0 UR6 27IN ABS (SUTURE) IMPLANT
TOWEL GREEN STERILE (TOWEL DISPOSABLE) ×1 IMPLANT
TOWEL GREEN STERILE FF (TOWEL DISPOSABLE) ×1 IMPLANT
TRAY LAPAROSCOPIC MC (CUSTOM PROCEDURE TRAY) ×1 IMPLANT
TROCAR ADV FIXATION 5X100MM (TROCAR) ×1 IMPLANT
TROCAR BALLN 12MMX100 BLUNT (TROCAR) ×1 IMPLANT
WARMER LAPAROSCOPE (MISCELLANEOUS) ×1 IMPLANT
WATER STERILE IRR 1000ML POUR (IV SOLUTION) ×1 IMPLANT

## 2023-07-22 NOTE — Transfer of Care (Signed)
 Immediate Anesthesia Transfer of Care Note  Patient: Deanna Cook  Procedure(s) Performed: LAPAROSCOPIC CHOLECYSTECTOMY (Abdomen) INDOCYANINE GREEN FLUORESCENCE IMAGING (ICG) (Abdomen)  Patient Location: PACU  Anesthesia Type:General  Level of Consciousness: drowsy and patient cooperative  Airway & Oxygen Therapy: Patient Spontanous Breathing  Post-op Assessment: Report given to RN and Post -op Vital signs reviewed and stable  Post vital signs: Reviewed and stable  Last Vitals:  Vitals Value Taken Time  BP 144/86 07/22/23 13:51  Temp    Pulse 75 07/22/23 13:54  Resp 12 07/22/23 13:54  SpO2 97 % 07/22/23 13:54  Vitals shown include unfiled device data.  Last Pain:  Vitals:   07/22/23 1044  TempSrc: Oral  PainSc:          Complications: No notable events documented.

## 2023-07-22 NOTE — Progress Notes (Signed)
 Subjective: CC: Patient was seen with husband and a minister at bedside. Patient states that her abdominal pain has been better this morning, however, she developed a headache this morning which she attributes to the lack of caffeine for the past 2 days.  Patient has old scars from her laparoscopic appendectomy some years ago.  Patient endorses that she is Jehovah witness.  Objective: Vital signs in last 24 hours: Temp:  [97.7 F (36.5 C)-98.2 F (36.8 C)] 97.7 F (36.5 C) (06/23 0400) Pulse Rate:  [58-69] 58 (06/23 0400) Resp:  [16-18] 16 (06/23 0400) BP: (129-168)/(79-89) 129/84 (06/23 0400) SpO2:  [98 %-100 %] 98 % (06/23 0400) Last BM Date : 07/21/23  Intake/Output from previous day: 06/22 0701 - 06/23 0700 In: 844.1 [P.O.:250; I.V.:494; IV Piggyback:100.1] Out: -  Intake/Output this shift: No intake/output data recorded.  PE: Physical Exam Constitutional:      General: She is not in acute distress.    Appearance: She is not ill-appearing.  Pulmonary:     Effort: Pulmonary effort is normal.  Abdominal:     General: Abdomen is protuberant.     Palpations: Abdomen is soft.     Tenderness: There is abdominal tenderness in the right upper quadrant.   Skin:    General: Skin is warm and dry.   Neurological:     General: No focal deficit present.     Mental Status: She is alert and oriented to person, place, and time.     Lab Results:  Recent Labs    07/21/23 0442  WBC 9.8  HGB 9.5*  HCT 29.9*  PLT 230   BMET Recent Labs    07/21/23 0442  NA 131*  K 4.4  CL 95*  CO2 30  GLUCOSE 102*  BUN 9  CREATININE 0.52  CALCIUM 8.2*   PT/INR Recent Labs    07/22/23 0648  LABPROT 14.0  INR 1.1   CMP     Component Value Date/Time   NA 131 (L) 07/21/2023 0442   K 4.4 07/21/2023 0442   CL 95 (L) 07/21/2023 0442   CO2 30 07/21/2023 0442   GLUCOSE 102 (H) 07/21/2023 0442   BUN 9 07/21/2023 0442   CREATININE 0.52 07/21/2023 0442   CALCIUM 8.2 (L)  07/21/2023 0442   PROT 5.7 (L) 07/21/2023 0442   ALBUMIN 2.5 (L) 07/21/2023 0442   AST 33 07/21/2023 0442   ALT 21 07/21/2023 0442   ALKPHOS 88 07/21/2023 0442   BILITOT 0.9 07/21/2023 0442   GFRNONAA >60 07/21/2023 0442   GFRAA >60 12/13/2018 1210   Lipase     Component Value Date/Time   LIPASE 41 07/21/2023 0442    Studies/Results: US  Abdomen Limited RUQ (LIVER/GB) Result Date: 07/21/2023 CLINICAL DATA:  RIGHT upper quadrant pain and vomiting. EXAM: ULTRASOUND ABDOMEN LIMITED RIGHT UPPER QUADRANT COMPARISON:  None Available. FINDINGS: Gallbladder: 1 cm gallstone towards the neck of the gallbladder. No gallbladder distension. There is small amount of pericholecystic fluid. Negative sonographic Murphy's sign. Common bile duct: Diameter: Normal at 2 mm Liver: Increased hepatic echogenicity. No focal lesion. No duct dilatation portal vein is patent on color Doppler imaging with normal direction of blood flow towards the liver. Other: None. IMPRESSION: 1. Cholelithiasis with small amount of pericholecystic fluid. Negative sonographic Murphy's sign. 2. No biliary dilatation. 3. Hepatic steatosis. Electronically Signed   By: Jackquline Boxer M.D.   On: 07/21/2023 11:27    Anti-infectives: Anti-infectives (From admission, onward)  Start     Dose/Rate Route Frequency Ordered Stop   07/22/23 1000  cefTRIAXone (ROCEPHIN) 2 g in sodium chloride  0.9 % 100 mL IVPB        2 g 200 mL/hr over 30 Minutes Intravenous Every 24 hours 07/21/23 1438 07/29/23 0959   07/22/23 0000  cefTRIAXone (ROCEPHIN) 2 g in sodium chloride  0.9 % 100 mL IVPB  Status:  Discontinued        2 g 200 mL/hr over 30 Minutes Intravenous Every 24 hours 07/21/23 1432 07/21/23 1437   07/21/23 1115  cefTRIAXone (ROCEPHIN) 2 g in sodium chloride  0.9 % 100 mL IVPB        2 g 200 mL/hr over 30 Minutes Intravenous  Once 07/21/23 1113 07/21/23 1239        Assessment/Plan Acute Cholecystitis   FEN - NPO, okay with sips  w/meds VTE - SCDs ID - Rocephin Foley - None Plan - Lap Chole today. Based on physical exam and US  findings which shows a 1 cm gallstone in the neck of gallbladder, patient would benefit from having her gallbladder removed to relieve her symptoms.  Patient remains afebrile, and in no acute distress at this time.  CBC currently pending... Patient is agreeable to proceed with surgery. Will discuss findings with my attending.  I reviewed nursing notes, last 24 h vitals and pain scores, last 48 h intake and output, last 24 h labs and trends, and last 24 h imaging results.  This care required high  level of medical decision making.    LOS: 0 days    Eulah Hammonds, [facility] Surgery 07/22/2023, 8:02 AM Please see Amion for pager number during day hours 7:00am-4:30pm

## 2023-07-22 NOTE — Plan of Care (Signed)

## 2023-07-22 NOTE — Anesthesia Procedure Notes (Signed)
 Procedure Name: Intubation Date/Time: 07/22/2023 12:28 PM  Performed by: Marva Lonni PARAS, CRNAPre-anesthesia Checklist: Patient identified, Emergency Drugs available, Suction available and Patient being monitored Patient Re-evaluated:Patient Re-evaluated prior to induction Oxygen Delivery Method: Circle System Utilized Preoxygenation: Pre-oxygenation with 100% oxygen Induction Type: IV induction Ventilation: Mask ventilation without difficulty Laryngoscope Size: Mac and 4 Grade View: Grade II Tube type: Oral Tube size: 7.0 mm Number of attempts: 1 Airway Equipment and Method: Stylet Placement Confirmation: ETT inserted through vocal cords under direct vision, positive ETCO2 and breath sounds checked- equal and bilateral Secured at: 22 cm Tube secured with: Tape Dental Injury: Teeth and Oropharynx as per pre-operative assessment

## 2023-07-22 NOTE — Plan of Care (Signed)

## 2023-07-22 NOTE — Anesthesia Preprocedure Evaluation (Signed)
 Anesthesia Evaluation  Patient identified by MRN, date of birth, ID band Patient awake    Reviewed: Allergy  & Precautions, NPO status , Patient's Chart, lab work & pertinent test results  History of Anesthesia Complications Negative for: history of anesthetic complications  Airway Mallampati: III  TM Distance: >3 FB Neck ROM: Full    Dental  (+) Dental Advisory Given,    Pulmonary neg shortness of breath, neg sleep apnea, neg COPD, neg recent URI   breath sounds clear to auscultation       Cardiovascular hypertension,  Rhythm:Regular     Neuro/Psych  PSYCHIATRIC DISORDERS Anxiety        GI/Hepatic negative GI ROS, Neg liver ROS,,,  Endo/Other  negative endocrine ROS    Renal/GU negative Renal ROS     Musculoskeletal negative musculoskeletal ROS (+)    Abdominal   Peds  Hematology  (+) REFUSES BLOOD PRODUCTSLab Results      Component                Value               Date                      WBC                      7.6                 07/22/2023                HGB                      12.6                07/22/2023                HCT                      36.6                07/22/2023                MCV                      88.8                07/22/2023                PLT                      275                 07/22/2023              Anesthesia Other Findings   Reproductive/Obstetrics                             Anesthesia Physical Anesthesia Plan  ASA: 2  Anesthesia Plan: General   Post-op Pain Management: Ofirmev  IV (intra-op)*   Induction: Intravenous  PONV Risk Score and Plan: 4 or greater and Ondansetron and Dexamethasone   Airway Management Planned: Oral ETT and Video Laryngoscope Planned  Additional Equipment:   Intra-op Plan:   Post-operative Plan: Extubation in OR  Informed Consent: I have reviewed the patients History and Physical, chart, labs and discussed  the procedure including the risks,  benefits and alternatives for the proposed anesthesia with the patient or authorized representative who has indicated his/her understanding and acceptance.     Dental advisory given  Plan Discussed with: CRNA  Anesthesia Plan Comments:        Anesthesia Quick Evaluation

## 2023-07-22 NOTE — Op Note (Signed)
 Deanna Cook [id number] 1970-04-09 07/22/2023  Laparoscopic Cholecystectomy with near infrared fluorescent cholangiography procedure Note  Indications: This patient presents with symptomatic gallbladder disease and will undergo laparoscopic cholecystectomy.  Pre-operative Diagnosis: acute calculous cholecystitis  Post-operative Diagnosis: same  Surgeon: Camellia Blush MD FACS  Assistants: Circulator: Bari Birmingham, RN Relief Circulator: Franchot Jodie LABOR, RN Scrub Person: Dyane Greig KATHRYNE Sherrine Powell, RN RN First Assistant: Sherrine Powell, RN   Anesthesia: General endotracheal anesthesia   Procedure Details  The patient was seen again in the Holding Room. The risks, benefits, complications, treatment options, and expected outcomes were discussed with the patient. The possibilities of reaction to medication, pulmonary aspiration, perforation of viscus, bleeding, recurrent infection, finding a normal gallbladder, the need for additional procedures, failure to diagnose a condition, the possible need to convert to an open procedure, and creating a complication requiring transfusion or operation were discussed with the patient. The likelihood of improving the patient's symptoms with return to their baseline status is good.  The patient and/or family concurred with the proposed plan, giving informed consent. The site of surgery properly noted. The patient was taken to Operating Room, identified as Deanna Cook and the procedure verified as Laparoscopic Cholecystectomy with ICG dye.  A Time Out was held and the above information confirmed. Antibiotic prophylaxis was administered.    ICG dye was administered preoperatively.    General endotracheal anesthesia was then administered and tolerated well. After the induction, the abdomen was prepped with Chloraprep and draped in the sterile fashion. The patient was positioned in the supine position.  Local anesthetic agent was injected into the skin  near the umbilicus and an incision made. We dissected down to the abdominal fascia with blunt dissection.  The fascia was incised vertically and we entered the peritoneal cavity bluntly.  A pursestring suture of 0-Vicryl was placed around the fascial opening.  The Hasson cannula was inserted and secured with the stay suture.  Pneumoperitoneum was then created with CO2 and tolerated well without any adverse changes in the patient's vital signs. An 5-mm port was placed in the subxiphoid position.  Two 5-mm ports were placed in the right upper quadrant. All skin incisions were infiltrated with a local anesthetic agent before making the incision and placing the trocars.   The patient had omental adhesions in the RUQ to the anterior abdominal wall which were taken down with hook cautery.  We positioned the patient in reverse Trendelenburg, tilted slightly to the patient's left.  The gallbladder was identified, the fundus grasped and retracted cephalad. Adhesions were lysed bluntly and with the electrocautery where indicated, taking care not to injure any adjacent organs or viscus. The infundibulum was grasped and retracted laterally, exposing the peritoneum overlying the triangle of Calot. This was then divided and exposed in a blunt fashion. A critical view of the cystic duct and cystic artery was obtained.  The cystic duct was clearly identified and bluntly dissected circumferentially.  Utilizing the Stryker camera system near infrared fluorescent cholangiography activity was visualized in the liver, cystic duct, common hepatic duct and common bile duct & small bowel.  This served as a secondary confirmation of our anatomy.  The cystic duct was then ligated with clips and divided. The cystic artery which had been identified & dissected free was ligated with clips and divided as well.   The gallbladder was dissected from the liver bed in retrograde fashion with the electrocautery. The gallbladder was removed and  placed in an Ecco sac.  The  gallbladder and Ecco sac were then removed through the umbilical port site. The liver bed was irrigated and inspected. Hemostasis was achieved with the electrocautery. Copious irrigation was utilized and was repeatedly aspirated until clear.  The pursestring suture was used to close the umbilical fascia.  2 additional interrupted 0 vicryl sutures were placed in the fascia using PMI suture passer with laparoscopic guidance.   We again inspected the right upper quadrant for hemostasis.  The umbilical closure was inspected and there was no air leak and nothing trapped within the closure. Pneumoperitoneum was released as we removed the trocars.  4-0 Monocryl was used to close the skin.   dermabond was applied. The patient was then extubated and brought to the recovery room in stable condition. Instrument, sponge, and needle counts were correct at closure and at the conclusion of the case.   Findings: Cholecystitis with Cholelithiasis critical view Infrared fluorescent cholangiography visualized within the common hepatic duct, common bile duct, cystic duct and small bowel  Estimated Blood Loss: Minimal         Drains: none         Specimens: Gallbladder           Complications: None; patient tolerated the procedure well.         Disposition: PACU - hemodynamically stable.         Condition: stable  Camellia HERO. Tanda, MD, FACS General, Bariatric, & Minimally Invasive Surgery [facility] Surgery,  [facility]

## 2023-07-23 ENCOUNTER — Other Ambulatory Visit (HOSPITAL_COMMUNITY): Payer: Self-pay

## 2023-07-23 ENCOUNTER — Encounter (HOSPITAL_COMMUNITY): Payer: Self-pay | Admitting: General Surgery

## 2023-07-23 LAB — SURGICAL PATHOLOGY

## 2023-07-23 MED ORDER — OXYCODONE HCL 5 MG PO TABS
5.0000 mg | ORAL_TABLET | Freq: Four times a day (QID) | ORAL | 0 refills | Status: DC | PRN
  Filled 2023-07-23: qty 15, 4d supply, fill #0

## 2023-07-23 NOTE — Progress Notes (Signed)
Discharge instructions given to pt. Pt verbalized understanding of all teaching and had no further questions. 

## 2023-07-23 NOTE — Discharge Instructions (Signed)

## 2023-07-23 NOTE — Discharge Summary (Signed)
 [facility] Discharge Summary   Patient ID: Deanna Cook MRN: [id number] DOB/AGE: 52/01/1970 52 y.o.  Admit date: 07/21/2023 Discharge date: 07/23/2023  Admitting Diagnosis: Acute Calculous Cholecystitis   Discharge Diagnosis Patient Active Problem List   Diagnosis Date Noted   Acute cholecystitis 07/21/2023   Well woman exam with routine gynecological exam 05/23/2023   Hormone replacement therapy (HRT) 05/23/2023   Genitourinary syndrome of menopause 05/23/2023   Vasomotor symptoms due to menopause 05/23/2023   Flu-like symptoms 04/16/2023   Persistent cough 04/16/2023   Acute non-recurrent maxillary sinusitis 04/16/2023   Left sided sciatica 03/12/2023   Skin tag 03/12/2023   Urinary incontinence 03/12/2023   Generalized anxiety disorder 07/10/2022   Chronic insomnia 01/08/2022   Sjogren's syndrome (HCC) 01/08/2022   Chronic foot pain, left 01/08/2022   Suspected sleep apnea 01/08/2022   Protrusion of lumbar intervertebral disc 09/19/2021   Positive ANA (antinuclear antibody) 10/21/2020   Chronic pain of right knee 10/21/2020   Essential hypertension 02/11/2019   Refusal of blood transfusions as patient is Jehovah's Witness 08/07/2018   Seasonal allergies 08/07/2018    Consultants  Imaging: US  Abdomen Limited RUQ (LIVER/GB) Result Date: 07/21/2023 CLINICAL DATA:  RIGHT upper quadrant pain and vomiting. EXAM: ULTRASOUND ABDOMEN LIMITED RIGHT UPPER QUADRANT COMPARISON:  None Available. FINDINGS: Gallbladder: 1 cm gallstone towards the neck of the gallbladder. No gallbladder distension. There is small amount of pericholecystic fluid. Negative sonographic Murphy's sign. Common bile duct: Diameter: Normal at 2 mm Liver: Increased hepatic echogenicity. No focal lesion. No duct dilatation portal vein is patent on color Doppler imaging with normal direction of blood flow towards the liver. Other: None. IMPRESSION: 1. Cholelithiasis with small amount of pericholecystic  fluid. Negative sonographic Murphy's sign. 2. No biliary dilatation. 3. Hepatic steatosis. Electronically Signed   By: Jackquline Boxer M.D.   On: 07/21/2023 11:27    Procedures Dr. Tanda (07/22/23) - Laparoscopic Cholecystectomy with [facility] Course:  Patient was admitted and underwent procedure listed above. Tolerated procedure well and was transferred to the floor.  Diet was advanced as tolerated.  On POD 1, the patient was voiding well, tolerating diet, ambulating well, pain well controlled, vital signs stable, incisions c/d/i and felt stable for discharge home. Patient will follow up in our office in 2 to 3 weeks and knows to call with questions or concerns.     Physical Exam: General:  Alert, NAD, pleasant, comfortable Abd:  Soft, ND, mild tenderness, incisions C/D/I  Allergies as of 07/23/2023   No Known Allergies      Medication List     TAKE these medications    acetaminophen  500 MG tablet Commonly known as: TYLENOL  Take 500 mg by mouth every 6 (six) hours as needed for mild pain (pain score 1-3), fever or headache.   amLODipine  10 MG tablet Commonly known as: NORVASC  Take 1 tablet (10 mg total) by mouth daily.   ascorbic acid  500 MG tablet Commonly known as: VITAMIN C Take 1 tablet (500 mg total) by mouth daily.   B-12 1000 MCG Tabs Take 1 tablet by mouth as needed.   BIOTIN PO Take 1 capsule by mouth daily.   estradiol  0.075 MG/24HR Commonly known as: VIVELLE -DOT Place 1 patch onto the skin 2 (two) times a week. What changed: additional instructions   estradiol  0.1 MG/GM vaginal cream Commonly known as: ESTRACE  VAGINAL Apply 1/2 gram to vulva nightly for 2 weeks then decrease to 1/2 gram to vulva two nights a week.  Do not use applicator.   fexofenadine 180 MG tablet Commonly known as: ALLEGRA Take 180 mg by mouth daily.   fluticasone  50 MCG/ACT nasal spray Commonly known as: FLONASE Place 1 spray into both nostrils daily as needed for  allergies or rhinitis.   gabapentin  100 MG capsule Commonly known as: Neurontin  Take 1 capsule (100 mg total) by mouth at bedtime for 3 days, THEN 2 capsules (200 mg total) at bedtime for 3 days, THEN 3 capsules (300 mg total) at bedtime. Start taking on: May 23, 2023   multivitamin capsule Take 1 capsule by mouth daily.   oxyCODONE  5 MG immediate release tablet Commonly known as: Oxy IR/ROXICODONE  Take 1 tablet (5 mg total) by mouth every 6 (six) hours as needed for severe pain (pain score 7-10) or breakthrough pain.   Vitamin D  (Ergocalciferol ) 1.25 MG (50000 UNIT) Caps capsule Commonly known as: DRISDOL  Take 1 capsule (50,000 Units total) by mouth every 7 (seven) days.   zolpidem  10 MG tablet Commonly known as: AMBIEN  Take 1 tablet (10 mg total) by mouth at bedtime as needed for sleep.               Discharge Care Instructions  (From admission, onward)           Start     Ordered   07/23/23 0000  No dressing needed        07/23/23 0830              Follow-up Information     Maczis, Puja Gosai, PA-C Follow up in 1 day(s).   Specialty: General Surgery Why: Call to confirm your appointment date and time, bring a copy of your photo ID and insurance card, arrive 30 minutes prior to your appointment on Contact information: [address] [place] [address] [phone]                 Signed: Eulah Hammonds, [facility] 07/23/2023, 8:30 AM

## 2023-07-26 NOTE — Anesthesia Postprocedure Evaluation (Signed)
 Anesthesia Post Note  Patient: Deanna Cook  Procedure(s) Performed: LAPAROSCOPIC CHOLECYSTECTOMY (Abdomen) INDOCYANINE GREEN  FLUORESCENCE IMAGING (ICG) (Abdomen)     Patient location during evaluation: PACU Anesthesia Type: General Level of consciousness: awake and alert Pain management: pain level controlled Vital Signs Assessment: post-procedure vital signs reviewed and stable Respiratory status: spontaneous breathing, nonlabored ventilation and respiratory function stable Cardiovascular status: blood pressure returned to baseline and stable Postop Assessment: no apparent nausea or vomiting Anesthetic complications: no   No notable events documented.                  Keyaan Lederman

## 2023-08-07 DIAGNOSIS — R1011 Right upper quadrant pain: Secondary | ICD-10-CM | POA: Diagnosis not present

## 2023-08-15 ENCOUNTER — Other Ambulatory Visit (HOSPITAL_COMMUNITY): Payer: Self-pay

## 2023-08-15 ENCOUNTER — Other Ambulatory Visit: Payer: Self-pay

## 2023-08-15 ENCOUNTER — Ambulatory Visit (INDEPENDENT_AMBULATORY_CARE_PROVIDER_SITE_OTHER): Admitting: Obstetrics

## 2023-08-15 ENCOUNTER — Encounter: Payer: Self-pay | Admitting: Obstetrics

## 2023-08-15 VITALS — BP 129/80 | HR 80 | Ht 70.0 in | Wt 225.0 lb

## 2023-08-15 DIAGNOSIS — N3941 Urge incontinence: Secondary | ICD-10-CM | POA: Diagnosis not present

## 2023-08-15 DIAGNOSIS — N958 Other specified menopausal and perimenopausal disorders: Secondary | ICD-10-CM | POA: Diagnosis not present

## 2023-08-15 DIAGNOSIS — R198 Other specified symptoms and signs involving the digestive system and abdomen: Secondary | ICD-10-CM | POA: Diagnosis not present

## 2023-08-15 LAB — POCT URINALYSIS DIP (CLINITEK)
Blood, UA: NEGATIVE
Glucose, UA: NEGATIVE mg/dL
Ketones, POC UA: NEGATIVE mg/dL
Leukocytes, UA: NEGATIVE
Nitrite, UA: NEGATIVE
POC PROTEIN,UA: 30 — AB
Spec Grav, UA: >=1.03 — AB (ref 1.010–1.025)
Urobilinogen, UA: 1 U/dL
pH, UA: 6 (ref 5.0–8.0)

## 2023-08-15 NOTE — Patient Instructions (Addendum)
 We discussed the symptoms of overactive bladder (OAB), which include urinary urgency, urinary frequency, night-time urination, with or without urge incontinence.  We discussed management including behavioral therapy (decreasing bladder irritants by following a bladder diet, urge suppression strategies, timed voids, bladder retraining), physical therapy, medication; and for refractory cases posterior tibial nerve stimulation, sacral neuromodulation, and intravesical botulinum toxin injection.   For night time frequency: - avoid fluid intake 3 hours before bedtime - reduce your caffeine intake  Constipation: Our goal is to achieve formed bowel movements daily or every-other-day.  You may need to try different combinations of the following options to find what works best for you - everybody's body works differently so feel free to adjust the dosages as needed.  Some options to help maintain bowel health include:  Dietary changes (more leafy greens, vegetables and fruits; less processed foods) Fiber supplementation (Benefiber, FiberCon, Metamucil or Psyllium). Start slow and increase gradually to full dose. Over-the-counter agents such as: stool softeners (Docusate or Colace) and/or laxatives (Miralax, milk of magnesia)  Power Pudding is a natural mixture that may help your constipation.  To make blend 1 cup applesauce, 1 cup wheat bran, and 3/4 cup prune juice, refrigerate and then take 1 tablespoon daily with a large glass of water as needed.   Women should try to eat at least 21 to 25 grams of fiber a day, while men should aim for 30 to 38 grams a day. You can add fiber to your diet with food or a fiber supplement such as psyllium (metamucil), benefiber, or fibercon.   Here's a look at how much dietary fiber is found in some common foods. When buying packaged foods, check the Nutrition Facts label for fiber content. It can vary among brands.  Fruits Serving size Total fiber (grams)*  Raspberries  1 cup 8.0  Pear 1 medium 5.5  Apple, with skin 1 medium 4.5  Banana 1 medium 3.0  Orange 1 medium 3.0  Strawberries 1 cup 3.0   Vegetables Serving size Total fiber (grams)*  Green peas, boiled 1 cup 9.0  Broccoli, boiled 1 cup chopped 5.0  Turnip greens, boiled 1 cup 5.0  Brussels sprouts, boiled 1 cup 4.0  Potato, with skin, baked 1 medium 4.0  Sweet corn, boiled 1 cup 3.5  Cauliflower, raw 1 cup chopped 2.0  Carrot, raw 1 medium 1.5   Grains Serving size Total fiber (grams)*  Spaghetti, whole-wheat, cooked 1 cup 6.0  Barley, pearled, cooked 1 cup 6.0  Bran flakes 3/4 cup 5.5  Quinoa, cooked 1 cup 5.0  Oat bran muffin 1 medium 5.0  Oatmeal, instant, cooked 1 cup 5.0  Popcorn, air-popped 3 cups 3.5  Brown rice, cooked 1 cup 3.5  Bread, whole-wheat 1 slice 2.0  Bread, rye 1 slice 2.0   Legumes, nuts and seeds Serving size Total fiber (grams)*  Split peas, boiled 1 cup 16.0  Lentils, boiled 1 cup 15.5  Black beans, boiled 1 cup 15.0  Baked beans, canned 1 cup 10.0  Chia seeds 1 ounce 10.0  Almonds 1 ounce (23 nuts) 3.5  Pistachios 1 ounce (49 nuts) 3.0  Sunflower kernels 1 ounce 3.0  *Rounded to nearest 0.5 gram. Source: [employer] for [employer], Legacy Release    For vaginal atrophy (thinning of the vaginal tissue that can cause dryness and burning) and UTI prevention we discussed estrogen replacement in the form of vaginal cream.   Start vaginal estrogen therapy nightly for two weeks then 2  times weekly at night. This can be placed with your finger or an applicator inside the vagina and around the urethra.  Please let us  know if the prescription is too expensive and we can look for alternative options.   Is vaginal estrogen therapy safe for me? Vaginal estrogen preparations act on the vaginal skin, and only a very tiny amount is absorbed into the bloodstream (0.01%).  They work in a similar way to hand or face cream.  There is minimal  absorption and they are therefore perfectly safe. If you have had breast cancer and have persistent troublesome symptoms which aren't settling with vaginal moisturisers and lubricants, local estrogen treatment may be a possibility, but consultation with your oncologist should take place first.

## 2023-08-15 NOTE — Progress Notes (Signed)
 New Patient Evaluation and Consultation  Referring Provider: Dallie Vera GAILS, MD PCP: Purcell Emil Schanz, MD Date of Service: 08/15/2023  SUBJECTIVE Chief Complaint: New Patient (Initial Visit) Deanna Cook is a 53 y.o. female here today for urinary leakage.)  History of Present Illness: Deanna Cook is a 53 y.o. Black or African-American female seen in consultation at the request of Dr Dallie for evaluation of urinary leakage.    Started topical estrogen for right vulvar itching, stopped using due to lack of relief Urinary leakage x 4 without sensation when she was in her bed and one time when she picked up a bag of chips at the grocery store prior to evaluation by Dr. Dallie. Urinary symptoms started 12 months ago. Denies episodes since 03/2023. Reports decreased sensation of pelvic pressure when she has a bowel movement. LAVH in 2014 for AUB-L at [facility], denies complications. Started on Vivelle -dot HRT patch with resolution of mood changes since 2024 S/p lap cholecystectomy by Dr. Tanda 07/22/23  Review of records significant for: Sjogren's, lumbar disc protusion with left sided sciatic   Urinary Symptoms: Leaks urine with with movement to the bathroom, with urgency, without sensation, and while asleep Leaks 1 time(s) per days with urgency Pad use: 2 liners/ mini-pads per day.   Patient is bothered by UI symptoms.  Day time voids 2-4.  Nocturia: 0-1 times per night to void, stopped after Ambien   Drinks until bedtime  Started sleep hygiene, sleeps around 9pm and denies peeing prior to sleep and takes lemon water prior bedtime 40oz started in 2024 Reports snoring, underwent sleep study and prior use of CPAP and stopped around 6 months ago Voiding dysfunction:  empties bladder well.  Patient does not use a catheter to empty bladder.  When urinating, patient feels she has no difficulties Drinks: 48oz water per day, 10oz of coffee, 20oz of Dr. Nunzio   UTIs: 0  UTI's in the last year.   Denies history of blood in urine, kidney or bladder stones, pyelonephritis, bladder cancer, and kidney cancer No results found for the last 90 days.   Pelvic Organ Prolapse Symptoms:                  Patient Denies a feeling of a bulge the vaginal area.   Bowel Symptom: Bowel movements: 1-2 time(s) per day Stool consistency: hard, Bristol III-IV Straining: yes.  Splinting: no.  Incomplete evacuation: no.  Patient Denies accidental bowel leakage / fecal incontinence Bowel regimen: none Last colonoscopy:  Results evidence of a prior functional end- to- end ileo- colonic anastomosis in the ascending colon per Dr. Shila, hemorrhoids HM Colonoscopy          Upcoming     Colonoscopy (Every 5 Years) Next due on 10/31/2025    10/31/2020  COLONOSCOPY   Only the first 1 history entries have been loaded, but more history exists.                Sexual Function Sexually active: yes.  Sexual orientation: Straight Pain with sex: No  Pelvic Pain Denies pelvic pain   Past Medical History:  Past Medical History:  Diagnosis Date   Allergic rhinitis    Allergy     Fibroid    Hypertension      Past Surgical History:   Past Surgical History:  Procedure Laterality Date   ABDOMINAL HYSTERECTOMY     LAVH-2014   APPENDECTOMY  1989   CHOLECYSTECTOMY N/A 07/22/2023   Procedure: LAPAROSCOPIC CHOLECYSTECTOMY;  Surgeon: Tanda Locus, MD;  Location: [facility] OR;  Service: General;  Laterality: N/A;   COLONOSCOPY  2005   about 20 years ago in [place] -normal exam   EYE SURGERY     Hole in retina   INDOCYANINE GREEN  FLUORESCENCE IMAGING (ICG) N/A 07/22/2023   Procedure: INDOCYANINE GREEN  FLUORESCENCE IMAGING (ICG);  Surgeon: Tanda Locus, MD;  Location: [facility] OR;  Service: General;  Laterality: N/A;     Past OB/GYN History: OB History  Gravida Para Term Preterm AB Living  0 0 0 0 0 0  SAB IAB Ectopic Multiple Live Births  0 0 0 0 0   Menopausal: Yes, at  age 23, Denies vaginal bleeding since menopause Contraception: s/p hysterectomy. Last pap smear was 07/22/2019 NILM.  Any history of abnormal pap smears: no. No results found for: DIAGPAP, HPVHIGH, ADEQPAP  Medications: Patient has a current medication list which includes the following prescription(s): acetaminophen , amlodipine , ascorbic acid , biotin, b-12, estradiol , fexofenadine, fluticasone , gabapentin , multivitamin, zolpidem , estradiol , and oxycodone .   Allergies: Patient has no known allergies.   Social History:  Social History   Tobacco Use   Smoking status: Never    Passive exposure: Never   Smokeless tobacco: Never  Vaping Use   Vaping status: Never Used  Substance Use Topics   Alcohol use: Yes    Alcohol/week: 3.0 standard drinks of alcohol    Types: 3 Standard drinks or equivalent per week    Comment: social   Drug use: Never    Relationship status: married Patient lives with her husband.   Patient is employed in [employer]. Regular exercise: Yes: walking History of abuse: No  Family History:   Family History  Problem Relation Age of Onset   Colon polyps Mother    Hypertension Mother    Colon cancer Mother 43   Colon polyps Father    Heart attack Father    Diabetes Mellitus I Father    Kidney failure Father    Thyroid  cancer Sister    Fibromyalgia Sister    Renal cancer Brother    Breast cancer Maternal Aunt    Breast cancer Cousin    Breast cancer Cousin    Esophageal cancer Neg Hx    Stomach cancer Neg Hx    Rectal cancer Neg Hx    Sleep apnea Neg Hx      Review of Systems: Review of Systems  Constitutional:  Negative for fever, malaise/fatigue and weight loss.  Respiratory:  Negative for cough, shortness of breath and wheezing.   Cardiovascular:  Negative for chest pain, palpitations and leg swelling.  Gastrointestinal:  Positive for abdominal pain and constipation. Negative for blood in stool.  Genitourinary:  Positive for  urgency. Negative for dysuria, frequency and hematuria.       Leakage  Skin:  Negative for rash.  Neurological:  Positive for headaches. Negative for dizziness and weakness.  Endo/Heme/Allergies:  Does not bruise/bleed easily.       Hot flashes  Psychiatric/Behavioral:  Negative for depression. The patient is not nervous/anxious.      OBJECTIVE Physical Exam: Vitals:   08/15/23 1148  BP: 129/80  Pulse: 80  Weight: 225 lb (102.1 kg)  Height: 5' 10 (1.778 m)    Physical Exam Constitutional:      General: She is not in acute distress.    Appearance: Normal appearance.  Genitourinary:     Bladder and urethral meatus normal.     No lesions in the vagina.  Right Labia: No rash, tenderness, lesions, skin changes or Bartholin's cyst.    Left Labia: No tenderness, lesions, skin changes, Bartholin's cyst or rash.    No vaginal discharge, erythema, tenderness, bleeding, ulceration or granulation tissue.     Posterior vaginal prolapse present.     Right Adnexa: not tender, not full and no mass present.    Left Adnexa: not tender, not full and no mass present.    Cervix is absent.     Uterus is absent.     Urethral meatus caruncle not present.    No urethral prolapse, tenderness, mass, hypermobility, discharge or stress urinary incontinence with cough stress test present.     Bladder is not tender, urgency on palpation not present and masses not present.      Pelvic Floor: Levator muscle strength is 3/5.    Levator ani not tender, obturator internus not tender, no asymmetrical contractions present and no pelvic spasms present.    Symmetrical pelvic sensation, anal wink present and BC reflex present. Cardiovascular:     Rate and Rhythm: Normal rate.  Pulmonary:     Effort: Pulmonary effort is normal. No respiratory distress.  Abdominal:     General: There is no distension.     Palpations: Abdomen is soft. There is no mass.     Tenderness: There is no abdominal tenderness.      Hernia: No hernia is present.   Neurological:     Mental Status: She is alert.  Vitals reviewed. Exam conducted with a chaperone present.      POP-Q:   POP-Q  -3                                            Aa   -3                                           Ba  -7                                              C   1                                            Gh  3                                            Pb  9                                            tvl   -2                                            Ap  -  2                                            Bp                                                 D    Post-Void Residual (PVR) by Bladder Scan: In order to evaluate bladder emptying, we discussed obtaining a postvoid residual and patient agreed to this procedure.  Procedure: The ultrasound unit was placed on the patient's abdomen in the suprapubic region after the patient had voided.    Post Void Residual - 08/15/23 1053       Post Void Residual   Post Void Residual 0 mL           Laboratory Results: Lab Results  Component Value Date   BILIRUBINUR NEGATIVE 07/21/2023   PROTEINUR 30 (A) 07/21/2023   LEUKOCYTESUR NEGATIVE 07/21/2023    Lab Results  Component Value Date   CREATININE 0.93 07/22/2023   CREATININE 0.52 07/21/2023   CREATININE 0.95 01/08/2022    Lab Results  Component Value Date   HGBA1C 5.9 01/08/2022    Lab Results  Component Value Date   HGB 12.6 07/22/2023     ASSESSMENT AND PLAN Ms. Quilter is a 53 y.o. with:  1. Genitourinary syndrome of menopause   2. Urge urinary incontinence   3. Straining during bowel movements     Genitourinary syndrome of menopause Assessment & Plan: - For symptomatic vaginal atrophy options include lubrication with a water-based lubricant, personal hygiene measures and barrier protection against wetness, and estrogen replacement in the form of vaginal cream, vaginal tablets, or a time-released  vaginal ring.   - encouraged to resume low dose vaginal estrogen if no relief of symptoms after optimization of bowel movement and bladder training   Urge urinary incontinence Assessment & Plan: - POCT UA negative, PVR 0mL - We discussed the symptoms of overactive bladder (OAB), which include urinary urgency, urinary frequency, nocturia, with or without urge incontinence.  While we do not know the exact etiology of OAB, several treatment options exist. We discussed management including behavioral therapy (decreasing bladder irritants, urge suppression strategies, timed voids, bladder retraining), physical therapy, medication; for refractory cases posterior tibial nerve stimulation, sacral neuromodulation, and intravesical botulinum toxin injection.  For anticholinergic medications, we discussed the potential side effects of anticholinergics including dry eyes, dry mouth, constipation, cognitive impairment and urinary retention. For Beta-3 agonist medication, we discussed the potential side effect of elevated blood pressure which is more likely to occur in individuals with uncontrolled hypertension. - encouraged timed voids, bowel regimen to optimize stool consistency, Kegel exercises - declines medications at this time For night time frequency: - avoid fluid intake 3 hours before bedtime - reports snoring, consider resuming CPAP for sleep apnea if persistent night time frequency   Orders: -     POCT URINALYSIS DIP (CLINITEK)  Straining during bowel movements Assessment & Plan: - we reviewed the importance of a better bowel regimen.  We also discussed the importance of avoiding chronic straining, as it can exacerbate her pelvic floor symptoms; we discussed treating constipation and straining prior to surgery, as postoperative straining can lead to damage to the repair and recurrence  of symptoms. We discussed initiating therapy with increasing fluid intake, fiber supplementation, stool softeners,  and laxatives such as miralax.  - encouraged titration of fiber supplementation and squatting for bowel movements to avoid straining     Time spent: I spent 62 minutes dedicated to the care of this patient on the date of this encounter to include pre-visit review of records, face-to-face time with the patient discussing urgency urinary incontinence, straining during bowel movements, GSM, and post visit documentation and ordering medication/ testing.    Lianne ONEIDA Gillis, MD

## 2023-08-15 NOTE — Assessment & Plan Note (Addendum)
-   POCT UA negative, PVR 0mL - We discussed the symptoms of overactive bladder (OAB), which include urinary urgency, urinary frequency, nocturia, with or without urge incontinence.  While we do not know the exact etiology of OAB, several treatment options exist. We discussed management including behavioral therapy (decreasing bladder irritants, urge suppression strategies, timed voids, bladder retraining), physical therapy, medication; for refractory cases posterior tibial nerve stimulation, sacral neuromodulation, and intravesical botulinum toxin injection.  For anticholinergic medications, we discussed the potential side effects of anticholinergics including dry eyes, dry mouth, constipation, cognitive impairment and urinary retention. For Beta-3 agonist medication, we discussed the potential side effect of elevated blood pressure which is more likely to occur in individuals with uncontrolled hypertension. - encouraged timed voids, bowel regimen to optimize stool consistency, Kegel exercises - declines medications at this time For night time frequency: - avoid fluid intake 3 hours before bedtime - reports snoring, consider resuming CPAP for sleep apnea if persistent night time frequency

## 2023-08-15 NOTE — Assessment & Plan Note (Signed)
-   we reviewed the importance of a better bowel regimen.  We also discussed the importance of avoiding chronic straining, as it can exacerbate her pelvic floor symptoms; we discussed treating constipation and straining prior to surgery, as postoperative straining can lead to damage to the repair and recurrence of symptoms. We discussed initiating therapy with increasing fluid intake, fiber supplementation, stool softeners, and laxatives such as miralax.  - encouraged titration of fiber supplementation and squatting for bowel movements to avoid straining

## 2023-08-15 NOTE — Assessment & Plan Note (Signed)
-   For symptomatic vaginal atrophy options include lubrication with a water-based lubricant, personal hygiene measures and barrier protection against wetness, and estrogen replacement in the form of vaginal cream, vaginal tablets, or a time-released vaginal ring.   - encouraged to resume low dose vaginal estrogen if no relief of symptoms after optimization of bowel movement and bladder training

## 2023-09-04 ENCOUNTER — Other Ambulatory Visit: Payer: Self-pay | Admitting: Emergency Medicine

## 2023-09-04 DIAGNOSIS — Z1231 Encounter for screening mammogram for malignant neoplasm of breast: Secondary | ICD-10-CM

## 2023-09-11 ENCOUNTER — Other Ambulatory Visit: Payer: Self-pay | Admitting: Emergency Medicine

## 2023-09-11 DIAGNOSIS — F5104 Psychophysiologic insomnia: Secondary | ICD-10-CM

## 2023-09-12 ENCOUNTER — Other Ambulatory Visit (HOSPITAL_COMMUNITY): Payer: Self-pay

## 2023-09-12 ENCOUNTER — Other Ambulatory Visit: Payer: Self-pay

## 2023-09-12 MED ORDER — ZOLPIDEM TARTRATE 10 MG PO TABS
10.0000 mg | ORAL_TABLET | Freq: Every evening | ORAL | 1 refills | Status: DC | PRN
  Filled 2023-09-12: qty 20, 20d supply, fill #0
  Filled 2023-10-16: qty 20, 20d supply, fill #1

## 2023-09-16 ENCOUNTER — Other Ambulatory Visit (HOSPITAL_COMMUNITY): Payer: Self-pay

## 2023-10-04 ENCOUNTER — Ambulatory Visit
Admission: RE | Admit: 2023-10-04 | Discharge: 2023-10-04 | Disposition: A | Discharge status: Home / Self Care | Source: Ambulatory Visit | Attending: Emergency Medicine | Admitting: Emergency Medicine

## 2023-10-04 DIAGNOSIS — Z1231 Encounter for screening mammogram for malignant neoplasm of breast: Secondary | ICD-10-CM | POA: Diagnosis not present

## 2023-10-10 ENCOUNTER — Ambulatory Visit: Payer: Self-pay | Admitting: Emergency Medicine

## 2023-10-16 ENCOUNTER — Other Ambulatory Visit (HOSPITAL_COMMUNITY): Payer: Self-pay

## 2023-10-16 ENCOUNTER — Ambulatory Visit (INDEPENDENT_AMBULATORY_CARE_PROVIDER_SITE_OTHER): Admitting: Obstetrics and Gynecology

## 2023-10-16 ENCOUNTER — Encounter: Payer: Self-pay | Admitting: Obstetrics and Gynecology

## 2023-10-16 VITALS — BP 122/70 | HR 65 | Temp 98.1°F | Wt 216.0 lb

## 2023-10-16 DIAGNOSIS — F419 Anxiety disorder, unspecified: Secondary | ICD-10-CM | POA: Diagnosis not present

## 2023-10-16 MED ORDER — BUSPIRONE HCL 5 MG PO TABS
5.0000 mg | ORAL_TABLET | Freq: Every day | ORAL | 1 refills | Status: AC | PRN
  Filled 2023-10-16: qty 30, 30d supply, fill #0
  Filled 2023-12-31: qty 30, 30d supply, fill #1

## 2023-10-16 NOTE — Progress Notes (Signed)
 53 y.o. G0P0000 female s/p TAH (2014) with VMS (on estradiol  patch), Sjogren's, HTN, urinary incontience here for mood symptoms and HRT management. Married.  No LMP recorded. Patient has had a hysterectomy.   She reports she had a recent gallbladder surgery (June 2025) and patch was removed. States she was without the patch for two weeks. Pt stated that she was very bipolar and emotional. Started back on the patch in July. Reports high anxiety symptoms now.  Notes work has been very stressful Has not worked out since surgery, used to walk for at 530am  On patch and gabapentin  Also with insomnia, taking ambien  GAD7: 8 PHQ0: 7  OB History  Gravida Para Term Preterm AB Living  0 0 0 0 0 0  SAB IAB Ectopic Multiple Live Births  0 0 0 0 0   Past Medical History:  Diagnosis Date   Allergic rhinitis    Allergy     Fibroid    Hypertension    Past Surgical History:  Procedure Laterality Date   ABDOMINAL HYSTERECTOMY     LAVH-2014   APPENDECTOMY  1989   CHOLECYSTECTOMY N/A 07/22/2023   Procedure: LAPAROSCOPIC CHOLECYSTECTOMY;  Surgeon: Tanda Locus, MD;  Location: [facility] OR;  Service: General;  Laterality: N/A;   COLONOSCOPY  2005   about 20 years ago in [place] -normal exam   EYE SURGERY     Hole in retina   INDOCYANINE GREEN  FLUORESCENCE IMAGING (ICG) N/A 07/22/2023   Procedure: INDOCYANINE GREEN  FLUORESCENCE IMAGING (ICG);  Surgeon: Tanda Locus, MD;  Location: [facility] OR;  Service: General;  Laterality: N/A;   Current Outpatient Medications on File Prior to Visit  Medication Sig Dispense Refill   amLODipine  (NORVASC ) 10 MG tablet Take 1 tablet (10 mg total) by mouth daily. 90 tablet 3   ascorbic acid  (VITAMIN C) 500 MG tablet Take 1 tablet (500 mg total) by mouth daily. 90 tablet 1   BIOTIN PO Take 1 capsule by mouth daily.     Cyanocobalamin (B-12) 1000 MCG TABS Take 1 tablet by mouth as needed.     estradiol  (ESTRACE  VAGINAL) 0.1 MG/GM vaginal cream Apply 1/2 gram to  vulva nightly for 2 weeks then decrease to 1/2 gram to vulva two nights a week. Do not use applicator. 42.5 g 1   estradiol  (VIVELLE -DOT) 0.075 MG/24HR Place 1 patch onto the skin 2 (two) times a week. 8 patch 12   fexofenadine (ALLEGRA) 180 MG tablet Take 180 mg by mouth daily.     fluticasone  (FLONASE ) 50 MCG/ACT nasal spray Place 1 spray into both nostrils daily as needed for allergies or rhinitis.     gabapentin  (NEURONTIN ) 100 MG capsule Take 1 capsule (100 mg total) by mouth at bedtime for 3 days, THEN 2 capsules (200 mg total) at bedtime for 3 days, THEN 3 capsules (300 mg total) at bedtime. (Patient taking differently: Take 1 capsule (100 mg total) 3 as needed ) 270 capsule 3   Multiple Vitamin (MULTIVITAMIN) capsule Take 1 capsule by mouth daily.     zolpidem  (AMBIEN ) 10 MG tablet Take 1 tablet (10 mg total) by mouth at bedtime as needed for sleep. 20 tablet 1   acetaminophen  (TYLENOL ) 500 MG tablet Take 500 mg by mouth every 6 (six) hours as needed for mild pain (pain score 1-3), fever or headache.     No current facility-administered medications on file prior to visit.   No Known Allergies    PE Today's Vitals   10/16/23  1212  BP: 122/70  Pulse: 65  Temp: 98.1 F (36.7 C)  TempSrc: Oral  SpO2: 95%  Weight: 216 lb (98 kg)   Body mass index is 30.99 kg/m.  Physical Exam Vitals reviewed.  Constitutional:      General: She is not in acute distress.    Appearance: Normal appearance.  HENT:     Head: Normocephalic and atraumatic.     Nose: Nose normal.  Eyes:     Extraocular Movements: Extraocular movements intact.     Conjunctiva/sclera: Conjunctivae normal.  Pulmonary:     Effort: Pulmonary effort is normal.  Musculoskeletal:        General: Normal range of motion.     Cervical back: Normal range of motion.  Neurological:     General: No focal deficit present.     Mental Status: She is alert.  Psychiatric:        Mood and Affect: Mood normal.        Behavior:  Behavior normal.      Assessment and Plan:        Anxiety -     busPIRone  HCl; Take 1 tablet (5 mg total) by mouth daily as needed.  Dispense: 30 tablet; Refill: 1  Recommend self care techniquues- reg exercise, stress reduction, sleep hygiene. Will add buspar  PRN Reviewed side effects Doing well with gabapentin  and ambien  All questions answered   Vera LULLA Pa, MD

## 2023-11-03 ENCOUNTER — Other Ambulatory Visit: Payer: Self-pay | Admitting: Emergency Medicine

## 2023-11-03 DIAGNOSIS — F5104 Psychophysiologic insomnia: Secondary | ICD-10-CM

## 2023-11-04 ENCOUNTER — Other Ambulatory Visit (HOSPITAL_COMMUNITY): Payer: Self-pay

## 2023-11-04 ENCOUNTER — Other Ambulatory Visit: Payer: Self-pay

## 2023-11-04 MED ORDER — ZOLPIDEM TARTRATE 10 MG PO TABS
10.0000 mg | ORAL_TABLET | Freq: Every evening | ORAL | 1 refills | Status: DC | PRN
  Filled 2023-11-04: qty 20, 20d supply, fill #0
  Filled 2023-12-04: qty 20, 20d supply, fill #1

## 2023-11-14 ENCOUNTER — Other Ambulatory Visit (HOSPITAL_COMMUNITY): Payer: Self-pay

## 2023-11-14 MED ORDER — FLUZONE 0.5 ML IM SUSY
0.5000 mL | PREFILLED_SYRINGE | Freq: Once | INTRAMUSCULAR | 0 refills | Status: AC
  Filled 2023-11-14: qty 0.5, 1d supply, fill #0

## 2023-11-20 ENCOUNTER — Ambulatory Visit: Admitting: Obstetrics

## 2023-11-21 ENCOUNTER — Ambulatory Visit: Admitting: Obstetrics

## 2023-11-22 ENCOUNTER — Other Ambulatory Visit: Payer: Self-pay

## 2023-11-22 ENCOUNTER — Other Ambulatory Visit (HOSPITAL_BASED_OUTPATIENT_CLINIC_OR_DEPARTMENT_OTHER): Payer: Self-pay

## 2023-11-22 ENCOUNTER — Other Ambulatory Visit (HOSPITAL_COMMUNITY): Payer: Self-pay

## 2023-11-27 ENCOUNTER — Other Ambulatory Visit (HOSPITAL_COMMUNITY): Payer: Self-pay

## 2023-11-27 MED ORDER — AMOXICILLIN 500 MG PO CAPS
500.0000 mg | ORAL_CAPSULE | Freq: Three times a day (TID) | ORAL | 0 refills | Status: AC
  Filled 2023-11-27: qty 30, 10d supply, fill #0

## 2023-12-02 ENCOUNTER — Encounter: Payer: Self-pay | Admitting: Radiology

## 2023-12-05 ENCOUNTER — Other Ambulatory Visit: Payer: Self-pay

## 2023-12-11 ENCOUNTER — Other Ambulatory Visit (HOSPITAL_COMMUNITY): Payer: Self-pay

## 2023-12-31 ENCOUNTER — Other Ambulatory Visit: Payer: Self-pay | Admitting: Emergency Medicine

## 2023-12-31 DIAGNOSIS — F5104 Psychophysiologic insomnia: Secondary | ICD-10-CM

## 2024-01-01 ENCOUNTER — Other Ambulatory Visit (HOSPITAL_COMMUNITY): Payer: Self-pay

## 2024-01-01 ENCOUNTER — Other Ambulatory Visit: Payer: Self-pay

## 2024-01-01 MED ORDER — ZOLPIDEM TARTRATE 10 MG PO TABS
10.0000 mg | ORAL_TABLET | Freq: Every evening | ORAL | 1 refills | Status: DC | PRN
  Filled 2024-01-01: qty 20, 20d supply, fill #0
  Filled 2024-01-19: qty 20, 20d supply, fill #1

## 2024-01-11 ENCOUNTER — Telehealth: Admitting: Nurse Practitioner

## 2024-01-11 DIAGNOSIS — J019 Acute sinusitis, unspecified: Secondary | ICD-10-CM | POA: Diagnosis not present

## 2024-01-11 DIAGNOSIS — B9789 Other viral agents as the cause of diseases classified elsewhere: Secondary | ICD-10-CM

## 2024-01-11 MED ORDER — IPRATROPIUM BROMIDE 0.03 % NA SOLN: 2.0000 | Freq: Two times a day (BID) | NASAL | 0 refills | Status: AC

## 2024-01-11 MED ORDER — PREDNISONE 20 MG PO TABS: 20.0000 mg | ORAL_TABLET | Freq: Every day | ORAL | 0 refills | Status: AC

## 2024-01-11 MED ORDER — LIDOCAINE VISCOUS HCL 2 % MT SOLN: 15.0000 mL | OROMUCOSAL | 0 refills | Status: AC | PRN

## 2024-01-11 MED ORDER — PROMETHAZINE-DM 6.25-15 MG/5ML PO SYRP: 5.0000 mL | ORAL_SOLUTION | Freq: Four times a day (QID) | ORAL | 0 refills | Status: AC | PRN

## 2024-01-11 NOTE — Progress Notes (Signed)
 We are sorry that you are not feeling well.  Here is how we plan to help!  Providers prescribe antibiotics to treat infections caused by bacteria. Antibiotics are very powerful in treating bacterial infections when they are used properly. To maintain their effectiveness, they should be used only when necessary. Overuse of antibiotics has resulted in the development of superbugs that are resistant to treatment!    After careful review of your answers, I would not recommend an antibiotic for your condition.  Antibiotics are not effective against viruses and therefore should not be used to treat them. Common examples of infections caused by viruses include colds and flu   Based on what you have shared with me it looks like you have VIRAL sinusitis.  Sinusitis is inflammation and infection in the sinus cavities of the head.  Based on your presentation I believe you most likely have Acute Viral Sinusitis.This is an infection most likely caused by a virus. There is not specific treatment for viral sinusitis other than to help you with the symptoms until the infection runs its course.  You may use an oral decongestant such as Mucinex D or if you have glaucoma or high blood pressure use plain Mucinex. Saline nasal spray help and can safely be used as often as needed for congestion,   I have prescribed:  Viscous lidocaine  for your throat pain A steroid nasal spray for nasal congestion  Prednisone  and cough syrup for your cough .   Some authorities believe that zinc sprays or the use of Echinacea may shorten the course of your symptoms.  Sinus infections are not as easily transmitted as other respiratory infection, however we still recommend that you avoid close contact with loved ones, especially the very young and elderly.  Remember to wash your hands thoroughly throughout the day as this is the number one way to prevent the spread of infection!  Home Care: Only take medications as instructed by your  medical team. Do not take these medications with alcohol. A steam or ultrasonic humidifier can help congestion.  You can place a towel over your head and breathe in the steam from hot water coming from a faucet. Avoid close contacts especially the very young and the elderly. Cover your mouth when you cough or sneeze. Always remember to wash your hands.  Get Help Right Away If: You develop worsening fever or sinus pain. You develop a severe head ache or visual changes. Your symptoms persist after you have completed your treatment plan.  Make sure you Understand these instructions. Will watch your condition. Will get help right away if you are not doing well or get worse.  Your e-visit answers were reviewed by a board certified advanced clinical practitioner to complete your personal care plan.  Depending on the condition, your plan could have included both over the counter or prescription medications.  If there is a problem please reply  once you have received a response from your provider.  Your safety is important to us .  If you have drug allergies check your prescription carefully.    You can use MyChart to ask questions about todays visit, request a non-urgent call back, or ask for a work or school excuse for 24 hours related to this e-Visit. If it has been greater than 24 hours you will need to follow up with your provider, or enter a new e-Visit to address those concerns.  You will get an e-mail in the next two days asking about your experience.  I hope that your e-visit has been valuable and will speed your recovery. Thank you for using e-visits.  I have spent 5 minutes in review of e-visit questionnaire, review and updating patient chart, medical decision making and response to patient.   Dahlia Nifong W Marlisha Vanwyk, NP

## 2024-01-19 ENCOUNTER — Other Ambulatory Visit (HOSPITAL_COMMUNITY): Payer: Self-pay

## 2024-01-20 ENCOUNTER — Other Ambulatory Visit: Payer: Self-pay

## 2024-01-21 ENCOUNTER — Telehealth: Payer: Self-pay

## 2024-01-21 DIAGNOSIS — R7689 Other specified abnormal immunological findings in serum: Secondary | ICD-10-CM

## 2024-01-21 NOTE — Telephone Encounter (Signed)
 We previously saw her for suspected sjogren syndrome but did not have a lot of inflammation at the time. We could recheck her labs for SSA Ab, sed rate, and CRP now during new/increased symptoms to see if this is related.  I do not routinely order T-cell tests for sjogren syndrome or musculoskeletal pains. This test would more commonly be test for immune deficiency and allergy /immunology specialist would be more experienced at assessing this.

## 2024-01-21 NOTE — Telephone Encounter (Signed)
 Patient called the office to see if she could come in for lab work. Says she has been experiencing a pain in shoulders  that's going up into her neck and now into the armpit, this has been going on for just about 3 weeks now. Patient states she has lots of inflammation going on an isn't sure if its cause of the cold or not. She asks about getting a T-Cell check done ? Please advise

## 2024-01-21 NOTE — Telephone Encounter (Signed)
 Attempted to contact the patient and left message for patient to call the office.

## 2024-01-24 NOTE — Telephone Encounter (Signed)
LMOM for patient to call office to discuss results. 

## 2024-01-27 ENCOUNTER — Other Ambulatory Visit: Payer: Self-pay

## 2024-01-27 DIAGNOSIS — R7689 Other specified abnormal immunological findings in serum: Secondary | ICD-10-CM

## 2024-01-27 NOTE — Addendum Note (Signed)
 Addended by: YVONE DAVED BROCKS on: 01/27/2024 03:42 PM   Modules accepted: Orders

## 2024-01-27 NOTE — Telephone Encounter (Signed)
 Advised patient of information below. Placed lab orders for CRP, ESR and SSA antibody. Patient will come today to have labs drawn.

## 2024-01-27 NOTE — Telephone Encounter (Signed)
 LMOM for patient to call the office for lab results.

## 2024-01-28 LAB — SEDIMENTATION RATE: Sed Rate: 9 mm/h (ref 0–30)

## 2024-01-28 LAB — C-REACTIVE PROTEIN: CRP: 5.3 mg/L

## 2024-01-28 LAB — SJOGRENS SYNDROME-A EXTRACTABLE NUCLEAR ANTIBODY: SSA (Ro) (ENA) Antibody, IgG: 6 AI — AB

## 2024-02-06 ENCOUNTER — Other Ambulatory Visit (HOSPITAL_COMMUNITY): Payer: Self-pay

## 2024-02-06 ENCOUNTER — Ambulatory Visit: Admitting: Emergency Medicine

## 2024-02-06 VITALS — BP 140/100 | HR 74 | Temp 98.4°F | Ht 70.0 in | Wt 216.0 lb

## 2024-02-06 DIAGNOSIS — N951 Menopausal and female climacteric states: Secondary | ICD-10-CM

## 2024-02-06 DIAGNOSIS — F5104 Psychophysiologic insomnia: Secondary | ICD-10-CM

## 2024-02-06 DIAGNOSIS — Z13220 Encounter for screening for lipoid disorders: Secondary | ICD-10-CM

## 2024-02-06 DIAGNOSIS — Z13228 Encounter for screening for other metabolic disorders: Secondary | ICD-10-CM

## 2024-02-06 DIAGNOSIS — Z130 Encounter for screening for diseases of the blood and blood-forming organs and certain disorders involving the immune mechanism: Secondary | ICD-10-CM | POA: Diagnosis not present

## 2024-02-06 DIAGNOSIS — F418 Other specified anxiety disorders: Secondary | ICD-10-CM | POA: Diagnosis not present

## 2024-02-06 DIAGNOSIS — Z0001 Encounter for general adult medical examination with abnormal findings: Secondary | ICD-10-CM

## 2024-02-06 DIAGNOSIS — Z0000 Encounter for general adult medical examination without abnormal findings: Secondary | ICD-10-CM | POA: Diagnosis not present

## 2024-02-06 DIAGNOSIS — I10 Essential (primary) hypertension: Secondary | ICD-10-CM | POA: Diagnosis not present

## 2024-02-06 DIAGNOSIS — Z1329 Encounter for screening for other suspected endocrine disorder: Secondary | ICD-10-CM

## 2024-02-06 LAB — COMPREHENSIVE METABOLIC PANEL WITH GFR
ALT: 14 U/L (ref 3–35)
AST: 17 U/L (ref 5–37)
Albumin: 4.4 g/dL (ref 3.5–5.2)
Alkaline Phosphatase: 65 U/L (ref 39–117)
BUN: 7 mg/dL (ref 6–23)
CO2: 30 meq/L (ref 19–32)
Calcium: 8.7 mg/dL (ref 8.4–10.5)
Chloride: 102 meq/L (ref 96–112)
Creatinine, Ser: 0.8 mg/dL (ref 0.40–1.20)
GFR: 84.75 mL/min
Glucose, Bld: 67 mg/dL — ABNORMAL LOW (ref 70–99)
Potassium: 3.5 meq/L (ref 3.5–5.1)
Sodium: 137 meq/L (ref 135–145)
Total Bilirubin: 0.6 mg/dL (ref 0.2–1.2)
Total Protein: 7.9 g/dL (ref 6.0–8.3)

## 2024-02-06 LAB — CBC WITH DIFFERENTIAL/PLATELET
Basophils Absolute: 0.1 K/uL (ref 0.0–0.1)
Basophils Relative: 0.8 % (ref 0.0–3.0)
Eosinophils Absolute: 0.2 K/uL (ref 0.0–0.7)
Eosinophils Relative: 2.6 % (ref 0.0–5.0)
HCT: 39 % (ref 36.0–46.0)
Hemoglobin: 13.2 g/dL (ref 12.0–15.0)
Lymphocytes Relative: 31.8 % (ref 12.0–46.0)
Lymphs Abs: 2.7 K/uL (ref 0.7–4.0)
MCHC: 33.9 g/dL (ref 30.0–36.0)
MCV: 88.7 fl (ref 78.0–100.0)
Monocytes Absolute: 0.5 K/uL (ref 0.1–1.0)
Monocytes Relative: 6.3 % (ref 3.0–12.0)
Neutro Abs: 5 K/uL (ref 1.4–7.7)
Neutrophils Relative %: 58.5 % (ref 43.0–77.0)
Platelets: 303 K/uL (ref 150.0–400.0)
RBC: 4.4 Mil/uL (ref 3.87–5.11)
RDW: 13.5 % (ref 11.5–15.5)
WBC: 8.5 K/uL (ref 4.0–10.5)

## 2024-02-06 LAB — LIPID PANEL
Cholesterol: 157 mg/dL (ref 28–200)
HDL: 67.6 mg/dL
LDL Cholesterol: 81 mg/dL (ref 10–99)
NonHDL: 89.55
Total CHOL/HDL Ratio: 2
Triglycerides: 44 mg/dL (ref 10.0–149.0)
VLDL: 8.8 mg/dL (ref 0.0–40.0)

## 2024-02-06 LAB — HEMOGLOBIN A1C: Hgb A1c MFr Bld: 5.9 % (ref 4.6–6.5)

## 2024-02-06 MED ORDER — AMLODIPINE BESYLATE 10 MG PO TABS
10.0000 mg | ORAL_TABLET | Freq: Every day | ORAL | 3 refills | Status: AC
  Filled 2024-02-06: qty 90, 90d supply, fill #0

## 2024-02-06 MED ORDER — ASCORBIC ACID 500 MG PO TABS
500.0000 mg | ORAL_TABLET | Freq: Every day | ORAL | 1 refills | Status: AC
  Filled 2024-02-06: qty 90, 90d supply, fill #0

## 2024-02-06 NOTE — Progress Notes (Signed)
 Deanna Cook 54 y.o.   Chief Complaint  Patient presents with   Annual Exam   Medication Refill    Ambien      HISTORY OF PRESENT ILLNESS: This is a 54 y.o. female here for annual exam and follow-up of chronic medical conditions including hypertension Complaining of increased stress at work affecting her mental health No other complaints or medical concerns today.   Medication Refill Pertinent negatives include no abdominal pain, chest pain, chills, congestion, coughing, fever, headaches, nausea, rash, sore throat or vomiting.     Prior to Admission medications  Medication Sig Start Date End Date Taking? Authorizing Provider  amLODipine  (NORVASC ) 10 MG tablet Take 1 tablet (10 mg total) by mouth daily. 12/19/22  Yes Damian Hofstra, Emil Schanz, MD  amoxicillin  (AMOXIL ) 500 MG capsule Take 1 capsule (500 mg total) by mouth 3 (three) times daily for 10 days. 11/27/23  Yes   ascorbic acid  (VITAMIN C) 500 MG tablet Take 1 tablet (500 mg total) by mouth daily. 11/10/22  Yes Saprina Chuong, Emil Schanz, MD  BIOTIN PO Take 1 capsule by mouth daily.   Yes [provider]  busPIRone  (BUSPAR ) 5 MG tablet Take 1 tablet (5 mg total) by mouth daily as needed. 10/16/23  Yes Hines, Genesis V, MD  Cyanocobalamin  (B-12) 1000 MCG TABS Take 1 tablet by mouth as needed.   Yes [provider]  estradiol  (ESTRACE  VAGINAL) 0.1 MG/GM vaginal cream Apply 1/2 gram to vulva nightly for 2 weeks then decrease to 1/2 gram to vulva two nights a week. Do not use applicator. 05/23/23  Yes Hines, Genesis V, MD  estradiol  (VIVELLE -DOT) 0.075 MG/24HR Place 1 patch onto the skin 2 (two) times a week. 05/23/23  Yes Hines, Genesis V, MD  fexofenadine (ALLEGRA) 180 MG tablet Take 180 mg by mouth daily.   Yes [provider]  fluticasone  (FLONASE ) 50 MCG/ACT nasal spray Place 1 spray into both nostrils daily as needed for allergies or rhinitis.   Yes [provider]  gabapentin  (NEURONTIN ) 100 MG  capsule Take 1 capsule (100 mg total) by mouth at bedtime for 3 days, THEN 2 capsules (200 mg total) at bedtime for 3 days, THEN 3 capsules (300 mg total) at bedtime. Patient taking differently: Take 1 capsule (100 mg total) 3 as needed  05/23/23 03/16/24 Yes Hines, Genesis V, MD  ipratropium (ATROVENT ) 0.03 % nasal spray Place 2 sprays into both nostrils every 12 (twelve) hours. 01/11/24  Yes Theotis Haze ORN, NP  lidocaine  (XYLOCAINE ) 2 % solution Use as directed 15 mLs in the mouth or throat as needed. 01/11/24  Yes Fleming, Zelda W, NP  Multiple Vitamin (MULTIVITAMIN) capsule Take 1 capsule by mouth daily.   Yes [provider]  promethazine -dextromethorphan (PROMETHAZINE -DM) 6.25-15 MG/5ML syrup Take 5 mLs by mouth 4 (four) times daily as needed. 01/11/24  Yes Fleming, Zelda W, NP  zolpidem  (AMBIEN ) 10 MG tablet Take 1 tablet (10 mg total) by mouth at bedtime as needed for sleep. 01/01/24  Yes Purcell Emil Schanz, MD    Allergies[1]  Patient Active Problem List   Diagnosis Date Noted   Hormone replacement therapy (HRT) 05/23/2023   Genitourinary syndrome of menopause 05/23/2023   Vasomotor symptoms due to menopause 05/23/2023   Skin tag 03/12/2023   Urge urinary incontinence 03/12/2023   Generalized anxiety disorder 07/10/2022   Chronic insomnia 01/08/2022   Sjogren's syndrome 01/08/2022   Chronic foot pain, left 01/08/2022   Suspected sleep apnea 01/08/2022   Protrusion of lumbar  intervertebral disc 09/19/2021   Positive ANA (antinuclear antibody) 10/21/2020   Chronic pain of right knee 10/21/2020   Essential hypertension 02/11/2019   Refusal of blood transfusions as patient is Jehovah's Witness 08/07/2018   Seasonal allergies 08/07/2018    Past Medical History:  Diagnosis Date   Allergic rhinitis    Allergy     Fibroid    Hypertension     Past Surgical History:  Procedure Laterality Date   ABDOMINAL HYSTERECTOMY     [facility]-2014   APPENDECTOMY  1989    CHOLECYSTECTOMY N/A 07/22/2023   Procedure: LAPAROSCOPIC CHOLECYSTECTOMY;  Surgeon: Tanda Locus, MD;  Location[facility] OR;  Service: General;  Laterality: N/A;   COLONOSCOPY  2005   about 20 years ago in [place] -normal exam   EYE SURGERY     Hole in retina   INDOCYANINE GREEN  FLUORESCENCE IMAGING (ICG) N/A 07/22/2023   Procedure: INDOCYANINE GREEN  FLUORESCENCE IMAGING (ICG);  Surgeon: Tanda Locus, MD;  Location: [facility] OR;  Service: General;  Laterality: N/A;    Social History   Socioeconomic History   Marital status: Married    Spouse name: Not on file   Number of children: Not on file   Years of education: Not on file   Highest education level: Bachelor's degree (e.g., BA, AB, BS)  Occupational History   Not on file  Tobacco Use   Smoking status: Never    Passive exposure: Never   Smokeless tobacco: Never  Vaping Use   Vaping status: Never Used  Substance and Sexual Activity   Alcohol use: Yes    Alcohol/week: 3.0 standard drinks of alcohol    Types: 3 Standard drinks or equivalent per week    Comment: social   Drug use: Never   Sexual activity: Yes    Birth control/protection: Surgical    Comment: 1st intercourse 54 yo-More than 5 partners  Other Topics Concern   Not on file  Social History Narrative   Not on file   Social Drivers of Health   Tobacco Use: Low Risk (02/06/2024)   Patient History    Smoking Tobacco Use: Never    Smokeless Tobacco Use: Never    Passive Exposure: Never  Financial Resource Strain: Low Risk (02/06/2024)   Overall Financial Resource Strain (CARDIA)    Difficulty of Paying Living Expenses: Not very hard  Food Insecurity: Food Insecurity Present (02/06/2024)   Epic    Worried About Programme Researcher, Broadcasting/film/video in the Last Year: Never true    Ran Out of Food in the Last Year: Sometimes true  Transportation Needs: No Transportation Needs (02/06/2024)   Epic    Lack of Transportation (Medical): No    Lack of Transportation (Non-Medical): No  Physical  Activity: Insufficiently Active (02/06/2024)   Exercise Vital Sign    Days of Exercise per Week: 2 days    Minutes of Exercise per Session: 30 min  Stress: Stress Concern Present (02/06/2024)   [employer] of Occupational Health - Occupational Stress Questionnaire    Feeling of Stress: Very much  Social Connections: Moderately Integrated (02/06/2024)   Social Connection and Isolation Panel    Frequency of Communication with Friends and Family: Once a week    Frequency of Social Gatherings with Friends and Family: Once a week    Attends Religious Services: More than 4 times per year    Active Member of [employer] or Organizations: Yes    Attends Banker Meetings: More than 4 times per year  Marital Status: Married  Catering Manager Violence: Not At Risk (07/21/2023)   Epic    Fear of Current or Ex-Partner: No    Emotionally Abused: No    Physically Abused: No    Sexually Abused: No  Depression (PHQ2-9): Low Risk (02/06/2024)   Depression (PHQ2-9)    PHQ-2 Score: 0  Alcohol Screen: Low Risk (02/06/2024)   Alcohol Screen    Last Alcohol Screening Score (AUDIT): 1  Housing: Low Risk (02/06/2024)   Epic    Unable to Pay for Housing in the Last Year: No    Number of Times Moved in the Last Year: 0    Homeless in the Last Year: No  Utilities: Not At Risk (07/21/2023)   Epic    Threatened with loss of utilities: No  Health Literacy: Not on file    Family History  Problem Relation Age of Onset   Colon polyps Mother    Hypertension Mother    Colon cancer Mother 57   Colon polyps Father    Heart attack Father    Diabetes Mellitus I Father    Kidney failure Father    Thyroid  cancer Sister    Fibromyalgia Sister    Breast cancer Maternal Aunt 69 - 54   Breast cancer Cousin 49 - 59   Breast cancer Cousin 51 - 59   Breast cancer Cousin 58   Renal cancer Brother    Esophageal cancer Neg Hx    Stomach cancer Neg Hx    Rectal cancer Neg Hx    Sleep apnea Neg Hx       Review of Systems  Constitutional: Negative.  Negative for chills and fever.  HENT: Negative.  Negative for congestion and sore throat.   Respiratory: Negative.  Negative for cough and shortness of breath.   Cardiovascular: Negative.  Negative for chest pain and palpitations.  Gastrointestinal:  Negative for abdominal pain, diarrhea, nausea and vomiting.  Genitourinary: Negative.  Negative for dysuria and hematuria.  Skin: Negative.  Negative for rash.  Neurological: Negative.  Negative for dizziness and headaches.  All other systems reviewed and are negative.   Vitals:   02/06/24 1525 02/06/24 1531  BP: (!) 140/100 (!) 140/100  Pulse: 74   Temp: 98.4 F (36.9 C)   SpO2: 98%     Physical Exam Vitals reviewed.  Constitutional:      Appearance: Normal appearance.  HENT:     Head: Normocephalic.     Right Ear: Tympanic membrane, ear canal and external ear normal.     Left Ear: Tympanic membrane, ear canal and external ear normal.     Mouth/Throat:     Mouth: Mucous membranes are moist.     Pharynx: Oropharynx is clear.  Eyes:     Extraocular Movements: Extraocular movements intact.     Conjunctiva/sclera: Conjunctivae normal.     Pupils: Pupils are equal, round, and reactive to light.  Cardiovascular:     Rate and Rhythm: Normal rate and regular rhythm.     Pulses: Normal pulses.     Heart sounds: Normal heart sounds.  Pulmonary:     Effort: Pulmonary effort is normal.     Breath sounds: Normal breath sounds.  Abdominal:     Palpations: Abdomen is soft.     Tenderness: There is no abdominal tenderness.  Musculoskeletal:     Cervical back: No tenderness.  Lymphadenopathy:     Cervical: No cervical adenopathy.  Skin:    General: Skin is warm  and dry.     Capillary Refill: Capillary refill takes less than 2 seconds.  Neurological:     General: No focal deficit present.     Mental Status: She is alert and oriented to person, place, and time.  Psychiatric:         Mood and Affect: Mood normal.        Behavior: Behavior normal.      ASSESSMENT & PLAN: Problem List Items Addressed This Visit       Cardiovascular and Mediastinum   Essential hypertension   BP Readings from Last 3 Encounters:  02/06/24 (!) 140/100  10/16/23 122/70  08/15/23 129/80  Elevated blood pressure in the office today Cardiovascular risks associated with hypertension discussed Continue amlodipine  10 mg daily Advised to monitor blood pressure readings at home daily for the next several weeks and contact the office if numbers persistently abnormal       Relevant Medications   amLODipine  (NORVASC ) 10 MG tablet   Other Relevant Orders   CBC with Differential/Platelet   Comprehensive metabolic panel with GFR     Other   Chronic insomnia   Much improved with Ambien  10 mg at bedtime as needed.       Vasomotor symptoms due to menopause   As per gynecology evaluation April 2025: Increase estradiol  patch to 75 mcg We will start gabapentin  at nighttime to help with insomnia, hot flashes, left-sided sciatica pain Discussed side effects including drowsiness, dizziness, mood changes, irritability, insomnia      Situational anxiety   Known trigger.  100% work Active and affecting quality of life a great deal.  Contributing to overall general symptoms of not feeling good. Mental health management discussed Takes BuSpar  5 mg as needed Recommend psychiatric evaluation.  Will place referral today.      Relevant Orders   Ambulatory referral to Psychology   Other Visit Diagnoses       Encounter for general adult medical examination with abnormal findings    -  Primary   Relevant Orders   CBC with Differential/Platelet   Comprehensive metabolic panel with GFR   Hemoglobin A1c   Lipid panel     Screening for deficiency anemia       Relevant Orders   CBC with Differential/Platelet     Screening for lipoid disorders       Relevant Orders   Lipid panel      Screening for endocrine, metabolic and immunity disorder       Relevant Orders   Comprehensive metabolic panel with GFR   Hemoglobin A1c      Modifiable risk factors discussed with patient. Anticipatory guidance according to age provided. The following topics were also discussed: Social Determinants of Health Smoking.  Non-smoker Diet and nutrition Benefits of exercise Cancer screening and review of most recent colonoscopy and mammogram reports. Vaccinations review and recommendations Cardiovascular risk assessment and need for blood work The 10-year ASCVD risk score (Arnett DK, et al., 2019) is: 4%   Values used to calculate the score:     Age: 54 years     Clinically relevant sex: Female     Is Non-Hispanic African American: Yes     Diabetic: No     Tobacco smoker: No     Systolic Blood Pressure: 140 mmHg     Is BP treated: Yes     HDL Cholesterol: 63.7 mg/dL     Total Cholesterol: 170 mg/dL  Mental health including depression and anxiety Fall and  accident prevention  Patient Instructions  Health Maintenance, Female Adopting a healthy lifestyle and getting preventive care are important in promoting health and wellness. Ask your health care provider about: The right schedule for you to have regular tests and exams. Things you can do on your own to prevent diseases and keep yourself healthy. What should I know about diet, weight, and exercise? Eat a healthy diet  Eat a diet that includes plenty of vegetables, fruits, low-fat dairy products, and lean protein. Do not eat a lot of foods that are high in solid fats, added sugars, or sodium. Maintain a healthy weight Body mass index (BMI) is used to identify weight problems. It estimates body fat based on height and weight. Your health care provider can help determine your BMI and help you achieve or maintain a healthy weight. Get regular exercise Get regular exercise. This is one of the most important things you can do for  your health. Most adults should: Exercise for at least 150 minutes each week. The exercise should increase your heart rate and make you sweat (moderate-intensity exercise). Do strengthening exercises at least twice a week. This is in addition to the moderate-intensity exercise. Spend less time sitting. Even light physical activity can be beneficial. Watch cholesterol and blood lipids Have your blood tested for lipids and cholesterol at 54 years of age, then have this test every 5 years. Have your cholesterol levels checked more often if: Your lipid or cholesterol levels are high. You are older than 53 years of age. You are at high risk for heart disease. What should I know about cancer screening? Depending on your health history and family history, you may need to have cancer screening at various ages. This may include screening for: Breast cancer. Cervical cancer. Colorectal cancer. Skin cancer. Lung cancer. What should I know about heart disease, diabetes, and high blood pressure? Blood pressure and heart disease High blood pressure causes heart disease and increases the risk of stroke. This is more likely to develop in people who have high blood pressure readings or are overweight. Have your blood pressure checked: Every 3-5 years if you are 44-60 years of age. Every year if you are 25 years old or older. Diabetes Have regular diabetes screenings. This checks your fasting blood sugar level. Have the screening done: Once every three years after age 28 if you are at a normal weight and have a low risk for diabetes. More often and at a younger age if you are overweight or have a high risk for diabetes. What should I know about preventing infection? Hepatitis B If you have a higher risk for hepatitis B, you should be screened for this virus. Talk with your health care provider to find out if you are at risk for hepatitis B infection. Hepatitis C Testing is recommended for: Everyone  born from [birth year] through 1965. Anyone with known risk factors for hepatitis C. Sexually transmitted infections (STIs) Get screened for STIs, including gonorrhea and chlamydia, if: You are sexually active and are younger than 53 years of age. You are older than 53 years of age and your health care provider tells you that you are at risk for this type of infection. Your sexual activity has changed since you were last screened, and you are at increased risk for chlamydia or gonorrhea. Ask your health care provider if you are at risk. Ask your health care provider about whether you are at high risk for HIV. Your health care provider may recommend  a prescription medicine to help prevent HIV infection. If you choose to take medicine to prevent HIV, you should first get tested for HIV. You should then be tested every 3 months for as long as you are taking the medicine. Pregnancy If you are about to stop having your period (premenopausal) and you may become pregnant, seek counseling before you get pregnant. Take 400 to 800 micrograms (mcg) of folic acid every day if you become pregnant. Ask for birth control (contraception) if you want to prevent pregnancy. Osteoporosis and menopause Osteoporosis is a disease in which the bones lose minerals and strength with aging. This can result in bone fractures. If you are 27 years old or older, or if you are at risk for osteoporosis and fractures, ask your health care provider if you should: Be screened for bone loss. Take a calcium or vitamin D  supplement to lower your risk of fractures. Be given hormone replacement therapy (HRT) to treat symptoms of menopause. Follow these instructions at home: Alcohol use Do not drink alcohol if: Your health care provider tells you not to drink. You are pregnant, may be pregnant, or are planning to become pregnant. If you drink alcohol: Limit how much you have to: 0-1 drink a day. Know how much alcohol is in your drink. In  the [place] one drink equals one 12 oz bottle of beer (355 mL), one 5 oz glass of wine (148 mL), or one 1 oz glass of hard liquor (44 mL). Lifestyle Do not use any products that contain nicotine or tobacco. These products include cigarettes, chewing tobacco, and vaping devices, such as e-cigarettes. If you need help quitting, ask your health care provider. Do not use street drugs. Do not share needles. Ask your health care provider for help if you need support or information about quitting drugs. General instructions Schedule regular health, dental, and eye exams. Stay current with your vaccines. Tell your health care provider if: You often feel depressed. You have ever been abused or do not feel safe at home. Summary Adopting a healthy lifestyle and getting preventive care are important in promoting health and wellness. Follow your health care provider's instructions about healthy diet, exercising, and getting tested or screened for diseases. Follow your health care provider's instructions on monitoring your cholesterol and blood pressure. This information is not intended to replace advice given to you by your health care provider. Make sure you discuss any questions you have with your health care provider. Document Revised: 06/06/2020 Document Reviewed: 06/06/2020 Elsevier Patient Education  2024 Elsevier Inc.      Emil Schaumann, MD [facility]    [1] No Known Allergies

## 2024-02-06 NOTE — Assessment & Plan Note (Signed)
 As per gynecology evaluation April 2025: Increase estradiol  patch to 75 mcg We will start gabapentin  at nighttime to help with insomnia, hot flashes, left-sided sciatica pain Discussed side effects including drowsiness, dizziness, mood changes, irritability, insomnia

## 2024-02-06 NOTE — Assessment & Plan Note (Signed)
 Much improved with Ambien 10 mg at bedtime as needed.

## 2024-02-06 NOTE — Assessment & Plan Note (Addendum)
 Known trigger.  100% work Active and affecting quality of life a great deal.  Contributing to overall general symptoms of not feeling good. Mental health management discussed Takes BuSpar  5 mg as needed Recommend psychiatric evaluation.  Will place referral today.

## 2024-02-06 NOTE — Patient Instructions (Signed)

## 2024-02-06 NOTE — Assessment & Plan Note (Signed)
 BP Readings from Last 3 Encounters:  02/06/24 (!) 140/100  10/16/23 122/70  08/15/23 129/80  Elevated blood pressure in the office today Cardiovascular risks associated with hypertension discussed Continue amlodipine  10 mg daily Advised to monitor blood pressure readings at home daily for the next several weeks and contact the office if numbers persistently abnormal

## 2024-02-07 ENCOUNTER — Other Ambulatory Visit (HOSPITAL_COMMUNITY): Payer: Self-pay

## 2024-02-10 ENCOUNTER — Other Ambulatory Visit (HOSPITAL_COMMUNITY): Payer: Self-pay

## 2024-02-10 ENCOUNTER — Ambulatory Visit: Payer: Self-pay | Admitting: Emergency Medicine

## 2024-02-10 ENCOUNTER — Telehealth: Payer: Self-pay

## 2024-02-10 NOTE — Telephone Encounter (Signed)
 Copied from CRM #[id number]. Topic: Referral - Status >> Feb 10, 2024  9:15 AM Olam RAMAN wrote: Reason for CRM: pt is calling to check on referral. Pt stated she got a call ON Friday and deleted the number from her phone. CB: [phone]

## 2024-02-11 ENCOUNTER — Other Ambulatory Visit (HOSPITAL_COMMUNITY): Payer: Self-pay

## 2024-02-13 ENCOUNTER — Other Ambulatory Visit: Payer: Self-pay | Admitting: Emergency Medicine

## 2024-02-13 ENCOUNTER — Other Ambulatory Visit (HOSPITAL_COMMUNITY): Payer: Self-pay

## 2024-02-13 DIAGNOSIS — F5104 Psychophysiologic insomnia: Secondary | ICD-10-CM

## 2024-02-13 MED ORDER — ZOLPIDEM TARTRATE 10 MG PO TABS
10.0000 mg | ORAL_TABLET | Freq: Every evening | ORAL | 1 refills | Status: AC | PRN
  Filled 2024-02-13: qty 90, 90d supply, fill #0

## 2024-02-13 NOTE — Telephone Encounter (Signed)
 Please advise

## 2024-02-13 NOTE — Telephone Encounter (Signed)
 Okay for 90-day prescriptions

## 2024-02-13 NOTE — Telephone Encounter (Signed)
 Ambien  is a controlled. I am unable to send in the 90 day. Can you please send in for patient

## 2024-02-13 NOTE — Telephone Encounter (Signed)
 New prescription sent today

## 2024-02-17 ENCOUNTER — Other Ambulatory Visit (HOSPITAL_COMMUNITY): Payer: Self-pay

## 2024-02-17 ENCOUNTER — Other Ambulatory Visit: Payer: Self-pay

## 2024-02-18 ENCOUNTER — Other Ambulatory Visit: Payer: Self-pay

## 2024-02-18 ENCOUNTER — Other Ambulatory Visit (HOSPITAL_COMMUNITY): Payer: Self-pay

## 2024-05-08 IMAGING — MG MM DIGITAL SCREENING BILAT W/ TOMO AND CAD
8 series · 8 of 24 positions shown · non-contrast
Comparison: Previous exam(s).

CLINICAL DATA: Screening.

EXAM:
DIGITAL SCREENING BILATERAL MAMMOGRAM WITH TOMOSYNTHESIS AND CAD
TECHNIQUE: Bilateral screening digital craniocaudal and mediolateral oblique
mammograms were obtained. Bilateral screening digital breast
tomosynthesis was performed. The images were evaluated with
computer-aided detection.

[L MLO synth-2D]
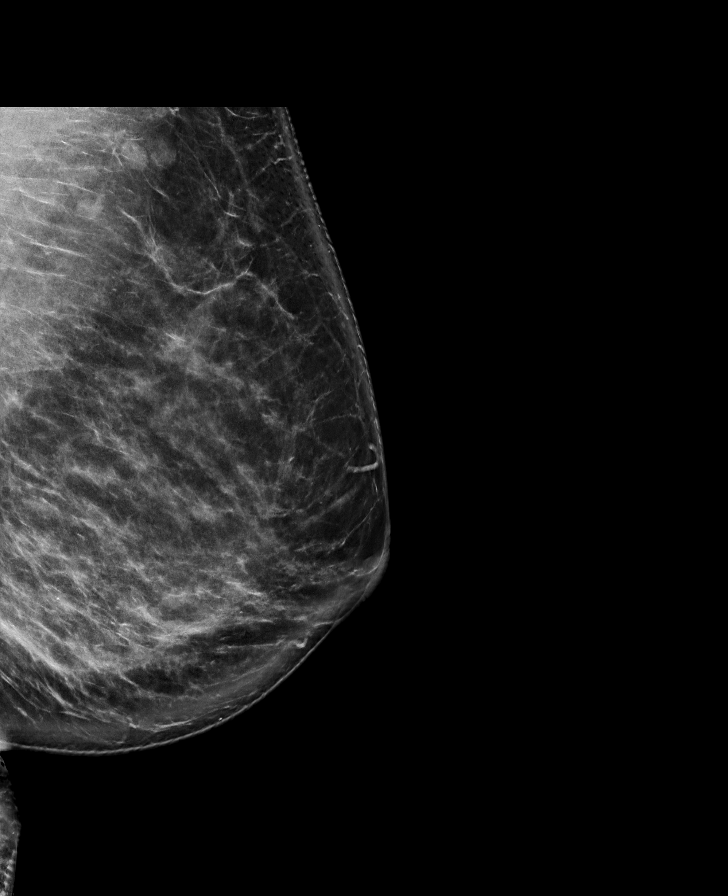

[L CC synth-2D]
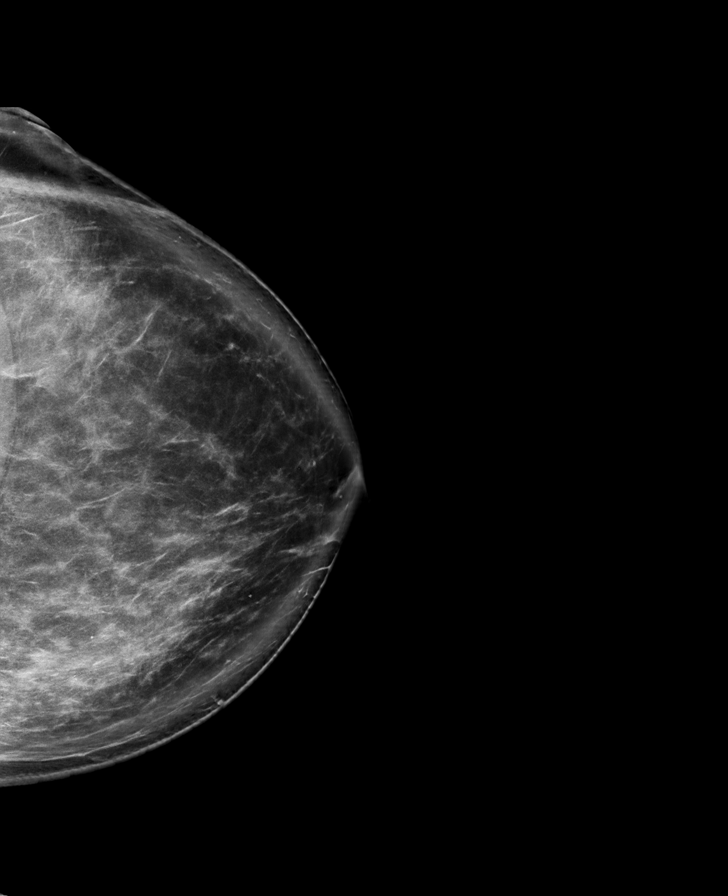

[R CC synth-2D]
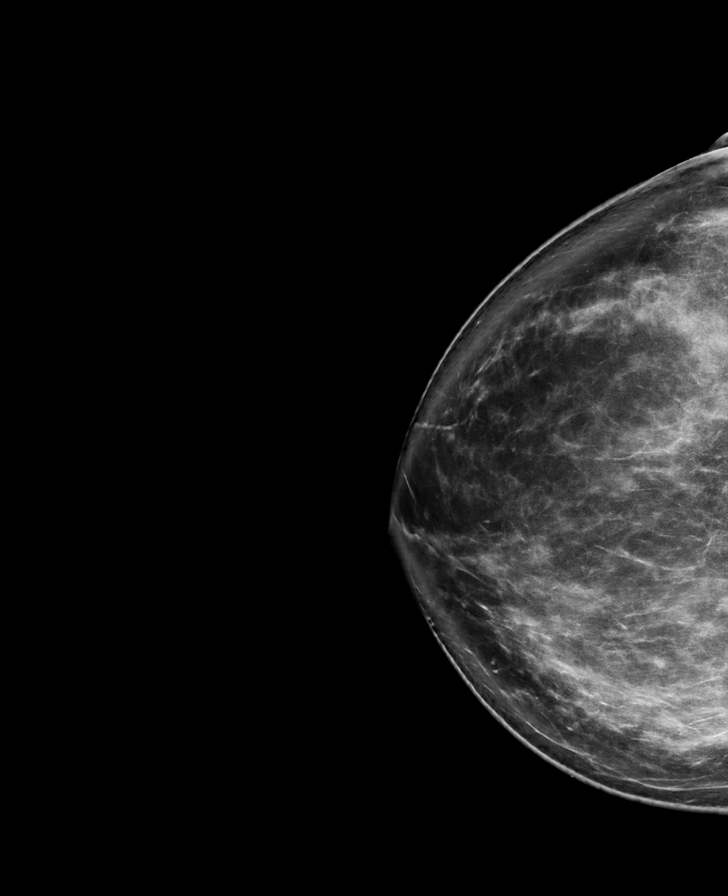

[R MLO synth-2D]
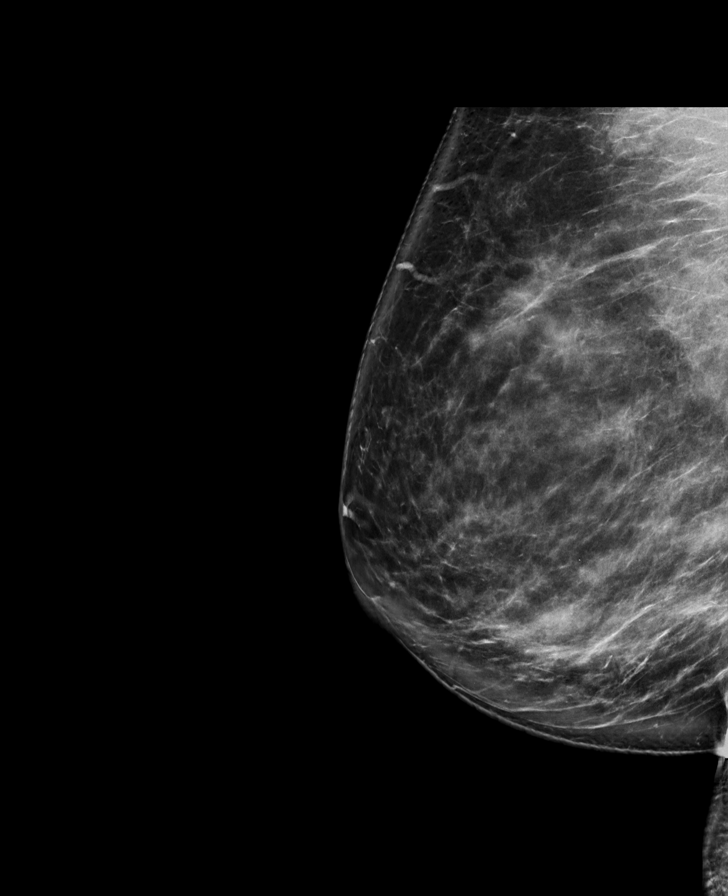

[R MLO tomo · tomo slice 57/112.0]
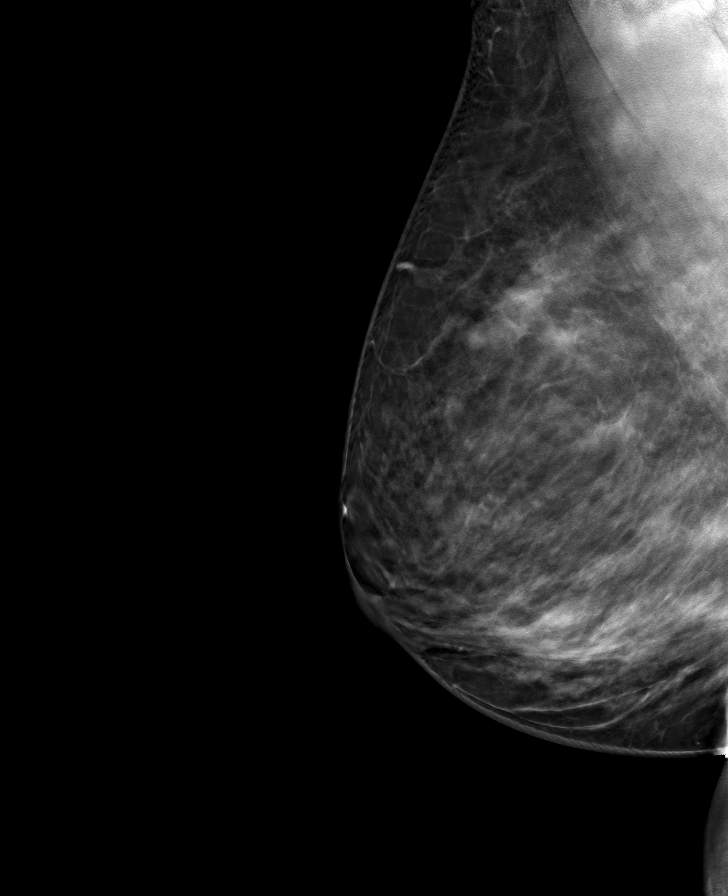

[R CC tomo · tomo slice 57/114.0]
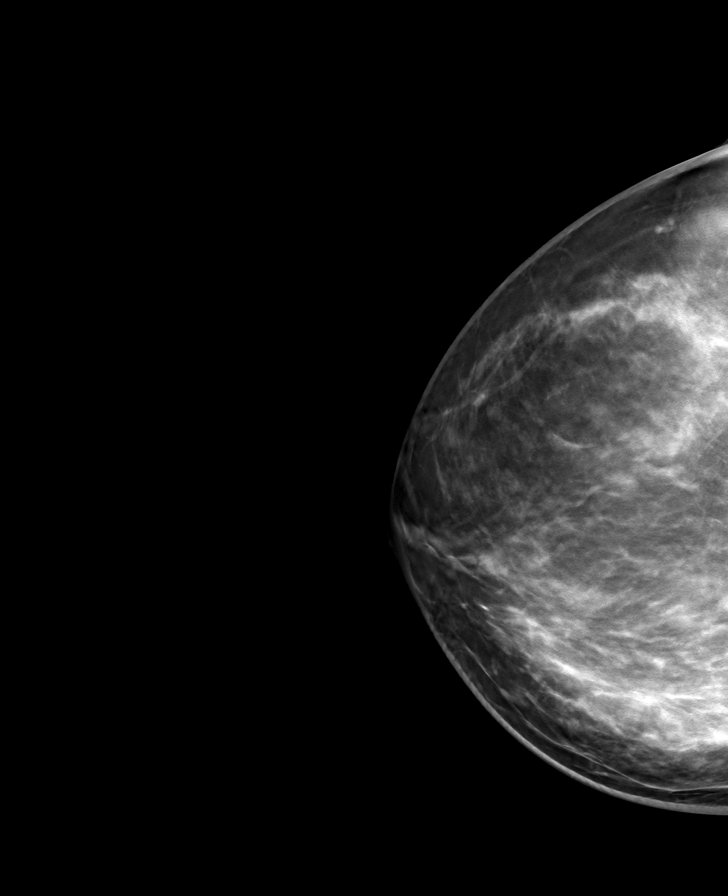

[L MLO tomo · tomo slice 55/110.0]
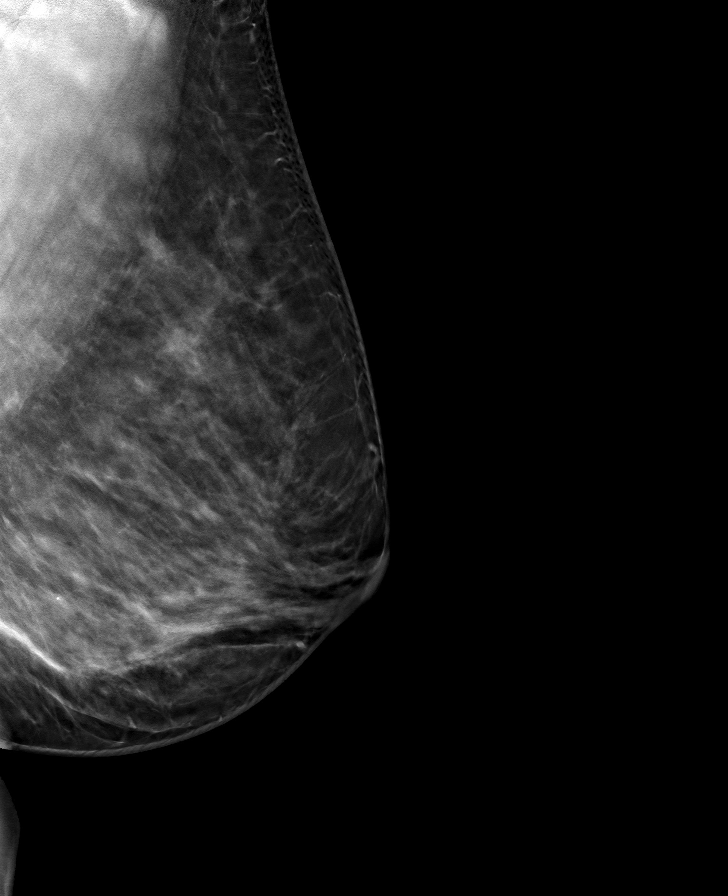

[L CC tomo · tomo slice 61/122.0]
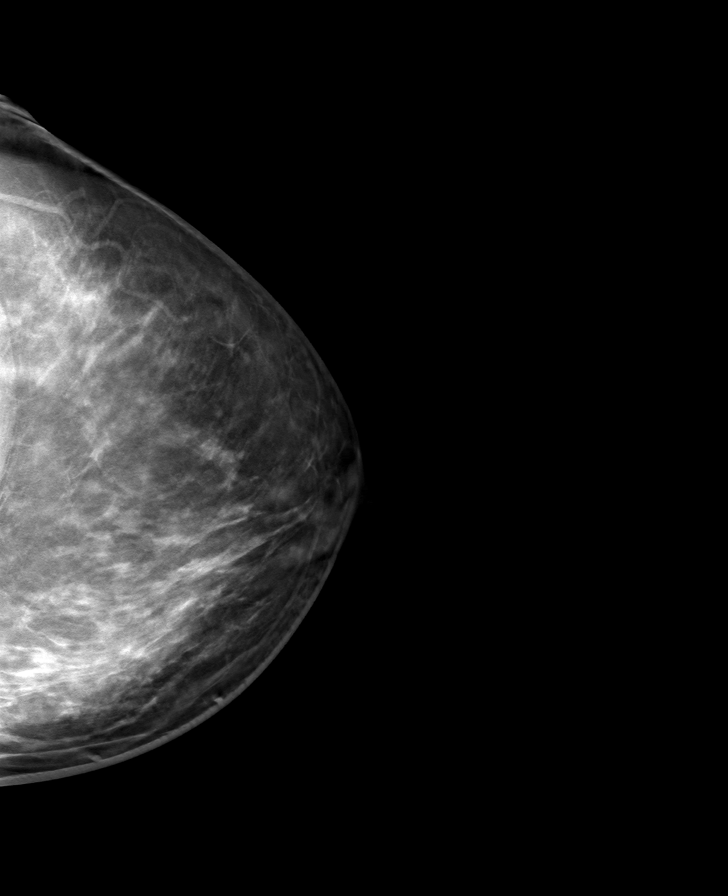

[8 of 24 positions shown; findings below may reference images not displayed]

ACR Breast Density Category c: The breast tissue is heterogeneously
dense, which may obscure small masses.
FINDINGS: There are no findings suspicious for malignancy.
IMPRESSION: No mammographic evidence of malignancy. A result letter of this
screening mammogram will be mailed directly to the patient.

RECOMMENDATION:
Screening mammogram in one year. (Code:Q3-W-BC3)

BI-RADS CATEGORY  1: Negative.
